# Patient Record
Sex: Male | Born: 1970 | Race: Black or African American | Hispanic: No | Marital: Single | Smoking: Former smoker
Health system: Southern US, Community
[De-identification: ages and names within clinical notes are randomized; demographics above are authoritative.]

## PROBLEM LIST (undated history)

## (undated) DIAGNOSIS — M109 Gout, unspecified: Secondary | ICD-10-CM

## (undated) DIAGNOSIS — E119 Type 2 diabetes mellitus without complications: Secondary | ICD-10-CM

## (undated) DIAGNOSIS — I10 Essential (primary) hypertension: Secondary | ICD-10-CM

## (undated) HISTORY — DX: Essential (primary) hypertension: I10

## (undated) HISTORY — PX: PENECTOMY: SHX741

## (undated) HISTORY — DX: Type 2 diabetes mellitus without complications: E11.9

---

## 2002-04-04 ENCOUNTER — Emergency Department (HOSPITAL_COMMUNITY): Admission: EM | Admit: 2002-04-04 | Discharge: 2002-04-04 | Payer: Self-pay | Admitting: Emergency Medicine

## 2014-11-14 ENCOUNTER — Emergency Department (HOSPITAL_COMMUNITY)
Admission: EM | Admit: 2014-11-14 | Discharge: 2014-11-14 | Disposition: A | Discharge status: Home / Self Care | Payer: BLUE CROSS/BLUE SHIELD | Attending: Emergency Medicine | Admitting: Emergency Medicine

## 2014-11-14 ENCOUNTER — Emergency Department (HOSPITAL_COMMUNITY): Payer: BLUE CROSS/BLUE SHIELD

## 2014-11-14 ENCOUNTER — Encounter (HOSPITAL_COMMUNITY): Payer: Self-pay | Admitting: *Deleted

## 2014-11-14 ENCOUNTER — Ambulatory Visit (INDEPENDENT_AMBULATORY_CARE_PROVIDER_SITE_OTHER): Payer: BLUE CROSS/BLUE SHIELD | Admitting: Family Medicine

## 2014-11-14 VITALS — BP 200/100 | HR 86 | Temp 98.1°F | Resp 18 | Ht 66.0 in | Wt 218.0 lb

## 2014-11-14 DIAGNOSIS — I10 Essential (primary) hypertension: Secondary | ICD-10-CM | POA: Insufficient documentation

## 2014-11-14 DIAGNOSIS — Z9119 Patient's noncompliance with other medical treatment and regimen: Secondary | ICD-10-CM | POA: Diagnosis not present

## 2014-11-14 DIAGNOSIS — E119 Type 2 diabetes mellitus without complications: Secondary | ICD-10-CM | POA: Diagnosis not present

## 2014-11-14 DIAGNOSIS — K088 Other specified disorders of teeth and supporting structures: Secondary | ICD-10-CM | POA: Diagnosis present

## 2014-11-14 DIAGNOSIS — R9431 Abnormal electrocardiogram [ECG] [EKG]: Secondary | ICD-10-CM | POA: Diagnosis not present

## 2014-11-14 DIAGNOSIS — K029 Dental caries, unspecified: Secondary | ICD-10-CM | POA: Insufficient documentation

## 2014-11-14 DIAGNOSIS — K047 Periapical abscess without sinus: Secondary | ICD-10-CM | POA: Insufficient documentation

## 2014-11-14 DIAGNOSIS — R590 Localized enlarged lymph nodes: Secondary | ICD-10-CM | POA: Insufficient documentation

## 2014-11-14 DIAGNOSIS — IMO0001 Reserved for inherently not codable concepts without codable children: Secondary | ICD-10-CM

## 2014-11-14 DIAGNOSIS — Z720 Tobacco use: Secondary | ICD-10-CM | POA: Diagnosis not present

## 2014-11-14 DIAGNOSIS — Z91199 Patient's noncompliance with other medical treatment and regimen due to unspecified reason: Secondary | ICD-10-CM

## 2014-11-14 DIAGNOSIS — R030 Elevated blood-pressure reading, without diagnosis of hypertension: Secondary | ICD-10-CM | POA: Diagnosis not present

## 2014-11-14 LAB — BASIC METABOLIC PANEL
Anion gap: 10 (ref 5–15)
BUN: 13 mg/dL (ref 6–20)
CHLORIDE: 102 mmol/L (ref 101–111)
CO2: 26 mmol/L (ref 22–32)
Calcium: 9.6 mg/dL (ref 8.9–10.3)
Creatinine, Ser: 1.14 mg/dL (ref 0.61–1.24)
GFR calc Af Amer: >60 mL/min (ref >60)
GLUCOSE: 119 mg/dL — AB (ref 70–99)
POTASSIUM: 4.1 mmol/L (ref 3.5–5.1)
Sodium: 138 mmol/L (ref 135–145)

## 2014-11-14 LAB — CBC WITH DIFFERENTIAL/PLATELET
Basophils Absolute: 0 10*3/uL (ref 0.0–0.1)
Basophils Relative: 0 % (ref 0–1)
Eosinophils Absolute: 0.1 10*3/uL (ref 0.0–0.7)
Eosinophils Relative: 1 % (ref 0–5)
HCT: 46.6 % (ref 39.0–52.0)
HEMOGLOBIN: 15.7 g/dL (ref 13.0–17.0)
LYMPHS ABS: 2.1 10*3/uL (ref 0.7–4.0)
Lymphocytes Relative: 19 % (ref 12–46)
MCH: 28.4 pg (ref 26.0–34.0)
MCHC: 33.7 g/dL (ref 30.0–36.0)
MCV: 84.4 fL (ref 78.0–100.0)
MONO ABS: 0.8 10*3/uL (ref 0.1–1.0)
Monocytes Relative: 7 % (ref 3–12)
NEUTROS PCT: 73 % (ref 43–77)
Neutro Abs: 7.9 10*3/uL — ABNORMAL HIGH (ref 1.7–7.7)
Platelets: 231 10*3/uL (ref 150–400)
RBC: 5.52 MIL/uL (ref 4.22–5.81)
RDW: 14.6 % (ref 11.5–15.5)
WBC: 11 10*3/uL — ABNORMAL HIGH (ref 4.0–10.5)

## 2014-11-14 LAB — TROPONIN I: TROPONIN I: 0.09 ng/mL — AB (ref <0.031)

## 2014-11-14 MED ORDER — HYDROCHLOROTHIAZIDE 25 MG PO TABS: 25.0000 mg | ORAL_TABLET | Freq: Every day | ORAL | Status: DC

## 2014-11-14 MED ORDER — CLINDAMYCIN HCL 300 MG PO CAPS: 300.0000 mg | ORAL_CAPSULE | Freq: Three times a day (TID) | ORAL | Status: AC

## 2014-11-14 MED ORDER — HYDROCODONE-ACETAMINOPHEN 5-325 MG PO TABS: 2.0000 | ORAL_TABLET | ORAL | Status: DC | PRN

## 2014-11-14 MED ORDER — CLINDAMYCIN PHOSPHATE 600 MG/50ML IV SOLN
600.0000 mg | Freq: Once | INTRAVENOUS | Status: AC
  Administered 2014-11-14: 600 mg via INTRAVENOUS
  Filled 2014-11-14: qty 50

## 2014-11-14 MED ORDER — IOHEXOL 300 MG/ML  SOLN
75.0000 mL | Freq: Once | INTRAMUSCULAR | Status: AC | PRN
  Administered 2014-11-14: 75 mL via INTRAVENOUS

## 2014-11-14 MED ORDER — HYDROCODONE-ACETAMINOPHEN 5-325 MG PO TABS
1.0000 | ORAL_TABLET | Freq: Once | ORAL | Status: AC
  Administered 2014-11-14: 1 via ORAL
  Filled 2014-11-14: qty 1

## 2014-11-14 MED ORDER — IBUPROFEN 400 MG PO TABS
600.0000 mg | ORAL_TABLET | Freq: Once | ORAL | Status: AC
  Administered 2014-11-14: 600 mg via ORAL
  Filled 2014-11-14 (×2): qty 1

## 2014-11-14 NOTE — Discharge Instructions (Signed)

## 2014-11-14 NOTE — ED Notes (Signed)
Patient went to urgent care today with c/o right jaw pain and swelling onset 2 days ago states he was told his blood pressure was elevated. Patient states he was dx. With htn several years ago however hasn't taken medication.

## 2014-11-14 NOTE — Progress Notes (Signed)
EKG read and patient discussed and examined with Ms. English.  Initial presentation for r face pain and dental issue, but noted marked blood pressure elevation, with med nonadherence past year. Denies weakness, speech difficulty or chest pain currently, but  EKG with TWI and nonspecific lateral findings without prior EKG available for review.  Repeat BP still significantly elevated - c/w HTN urgency. Placed on monitor, IV placed, tx to [facility] by EMS for further eval.   1:23 PM Report given to EMS, with transfer of care. Charge nurse advised.

## 2014-11-14 NOTE — ED Provider Notes (Signed)
CSN: [id number]     Arrival date & time 11/14/14  1411 History   First MD Initiated Contact with Patient 11/14/14 1428     Chief Complaint  Patient presents with  . Dental Pain     (Consider location/radiation/quality/duration/timing/severity/associated sxs/prior Treatment) HPI  44 year old male presents from urgent care after they noted significant hypertension. His blood pressure was 180/120. He presented to their facility due to right tooth and jaw pain. He states his been mild for the last day or 2 but now significant worse today. The patient denies headache, blurry vision, chest pain, shortness of breath, or other symptoms. No trouble swallowing or drooling. The patient states he has a history of hypertension but has not taken medicine in over a year. Patient states he took ibuprofen earlier with good relief.  Past Medical History  Diagnosis Date  . Diabetes mellitus without complication   . Hypertension    History reviewed. No pertinent past surgical history. History reviewed. No pertinent family history. History  Substance Use Topics  . Smoking status: Current Every Day Smoker -- 1.00 packs/day    Types: Cigarettes  . Smokeless tobacco: Not on file  . Alcohol Use: No    Review of Systems  Constitutional: Negative for fever.  HENT: Positive for dental problem and facial swelling. Negative for sore throat, trouble swallowing and voice change.   Eyes: Negative for visual disturbance.  Respiratory: Negative for shortness of breath.   Cardiovascular: Negative for chest pain and leg swelling.  Neurological: Negative for weakness, numbness and headaches.  All other systems reviewed and are negative.     Allergies  Review of patient's allergies indicates no known allergies.  Home Medications   Prior to Admission medications   Medication Sig Start Date End Date Taking? Authorizing Provider  clindamycin (CLEOCIN) 300 MG capsule Take 1 capsule (300 mg total) by mouth 3  (three) times daily. 11/14/14 11/21/14 Yes Stephanie D English, PA   BP 232/122 mmHg  Pulse 79  Temp(Src) 98.2 F (36.8 C) (Oral)  Resp 12  SpO2 98% Physical Exam  Constitutional: He is oriented to person, place, and time. He appears well-developed and well-nourished.  HENT:  Head: Normocephalic and atraumatic.  Right Ear: External ear normal.  Left Ear: External ear normal.  Nose: Nose normal.  Mouth/Throat: Uvula is midline. No trismus in the jaw. Dental caries present. No dental abscesses.    Eyes: EOM are normal. Pupils are equal, round, and reactive to light. Right eye exhibits no discharge. Left eye exhibits no discharge.  Neck: Normal range of motion. Neck supple.    Cardiovascular: Normal rate, regular rhythm, normal heart sounds and intact distal pulses.   Pulmonary/Chest: Effort normal and breath sounds normal. No stridor.  Abdominal: Soft. There is no tenderness.  Musculoskeletal: He exhibits no edema.  Neurological: He is alert and oriented to person, place, and time.  CN 2-12 grossly intact. 5/5 strength in all 4 extremities. Grossly normal sensation.  Skin: Skin is warm and dry.  Nursing note and vitals reviewed.   ED Course  Procedures (including critical care time) Labs Review Labs Reviewed  CBC WITH DIFFERENTIAL/PLATELET - Abnormal; Notable for the following:    WBC 11.0 (*)    Neutro Abs 7.9 (*)    All other components within normal limits  BASIC METABOLIC PANEL  TROPONIN I    Imaging Review Dg Chest 1 View  11/14/2014   CLINICAL DATA:  Dental pain since yesterday, history hypertension, smoking, borderline diabetes  EXAM: CHEST  1 VIEW  COMPARISON:  None  FINDINGS: Upper normal heart size.  Normal mediastinal contours and pulmonary vascularity.  Minimal peribronchial thickening.  No infiltrate, pleural effusion or pneumothorax.  Bones unremarkable.  IMPRESSION: Minimal bronchitic changes without infiltrate.   Electronically Signed   By: Ulyses Southward M.D.    On: 11/14/2014 15:52     EKG Interpretation   Date/Time:  Monday Nov 14 2014 14:12:31 EDT Ventricular Rate:  73 PR Interval:  184 QRS Duration: 107 QT Interval:  402 QTC Calculation: 443 R Axis:   -56 Text Interpretation:  Sinus rhythm Left anterior fascicular block Probable  left ventricular hypertrophy Anterior Q waves, possibly due to LVH  Abnormal T, consider ischemia, lateral leads No old tracing to compare  Confirmed by Jacoba Cherney  MD, Allye Hoyos ([id number]) on 11/14/2014 2:27:35 PM      MDM   Final diagnoses:  Essential hypertension    Patient is well-appearing here, no chest or neuro complaints. Significant hypertensive, has likely been this way for quite some time. Patient has an EKG that does show T-wave inversions but I believe these are mostly due to LVH pattern. However given his hypertension, low work was evaluated prior to starting on antihypertensives given he has likely had significant for blood pressure control for quite some time. As for his tooth, he does not have a drainable dental abscess at this time but does have some mild neck swelling, will get CT scan to rule out deep space infection. Care transferred to Dr. Anitra Lauth with CT and labs pending.    Pricilla Loveless, MD 11/14/14 [phone]

## 2014-11-14 NOTE — ED Provider Notes (Signed)
CBC and BMP are within normal limits. Troponin is mildly elevated however speaking with the patient he has no chest pain, no shortness of breath no dizziness no headaches or any others concerning symptoms for ACS or malignant hypertension. Patient's blood pressure here has been read between 230/120-162/87. His CT shows evidence of periosteal abscess but no evidence of Ludwig angina. He has no pharyngeal masses present.\  Final result by Rad Results In Interface (11/14/14 18:54:28)   Narrative:   CLINICAL DATA: Right jaw pain and swelling for 2 days.  EXAM: CT NECK WITH CONTRAST  TECHNIQUE: Multidetector CT imaging of the neck was performed using the standard protocol following the bolus administration of intravenous contrast.  CONTRAST: 45mL OMNIPAQUE IOHEXOL 300 MG/ML SOLN  COMPARISON: None.  FINDINGS: There is inflammation in the right face from odontogenic infection of the thirtieth tooth which shows periapical abscess which has breeched the buccal cortex and causes a 7 mm subperiosteal abscess. There are also large cavities with apical erosion involving the bilateral maxillary molars and left upper first premolar,  Pharynx and larynx: Asymmetric right glossotonsillar sulcus with right-sided fullness and enhancement.  Salivary glands: Negative  Thyroid: Negative  Lymph nodes: Mild reactive appearing enlargement in the right submandibular station. No suppurative changes.  Vascular: Mild bilateral cervical carotid atherosclerosis.  Limited intracranial: Negative  Visualized orbits: Negative  Mastoids and visualized paranasal sinuses: Lobulated mucosal thickening in the inferior maxillary antra, possibly odontogenic given neighboring periodontal disease.  Upper chest: No pneumonia  IMPRESSION: 1. Odontogenic infection in the right face from tooth #30 with 7 mm subperiosteal abscess. 2. Asymmetric fullness of the right glossotonsillar sulcus favors tonsillar  hypertrophy, but recommend mucosal exam to exclude a mass.    Patient sent home with pain control, blood pressure medication and follow-up.   Gwyneth Sprout, MD 11/14/14 [phone]

## 2014-11-14 NOTE — Progress Notes (Signed)
[facility] [address] [place] [address] [phone]  Date:  11/14/2014   Name:  Troy Singh   DOB:  05-24-1971   MRN:  [id number]  PCP:  No PCP Per Patient    Chief Complaint: Mouth Lesions   History of Present Illness:  Troy Singh is a 44 y.o. very pleasant male smoker with PMH of diabetes mellitus 2 and hypertension who presents with the following:  Patient is here reporting right sided mouth pain that is throbbing in character, which started yesterday.  He was sensitive with toothbrushing yesterday, but over 24 hours, it has progressed to increased facial pain and swelling at his right lower teeth and cheek.  He denies any trauma to the mouth.  He has no fever, ear pain, or dyspnea.  He has done nothing for the pain. Blood pressure is elevated today at 180/120, which he states, "stays elevated".  He was on an anti-hypertensive, but has been non-compliant for about a year.  He states that the medications did not help his BP, though he can not recall the name of his anti-hypertensive.  He has no chest pain, sob, diaphoresis, palpitations, headache, numbness or tingling, or vision changes.  [facility], but he has not been followed for about a year.  He has no known hx of MI, cardiac or cerebrovascular events.     There are no active problems to display for this patient.   Past Medical History  Diagnosis Date  . Diabetes mellitus without complication   . Hypertension     History reviewed. No pertinent past surgical history.  History  Substance Use Topics  . Smoking status: Current Every Day Smoker -- 1.00 packs/day    Types: Cigarettes  . Smokeless tobacco: Not on file  . Alcohol Use: No    History reviewed. No pertinent family history.  No Known Allergies  Medication list has been reviewed and updated.  No current outpatient prescriptions on file prior to visit.   No current facility-administered  medications on file prior to visit.    Review of Systems: ROS otherwise unremarkable unless listed above.  Physical Examination: Filed Vitals:   11/14/14 1111  BP: 180/120  Pulse: 86  Temp: 98.1 F (36.7 C)  Resp: 18   Filed Vitals:   11/14/14 1111  Height:  (1.676 m)  Weight: 218 lb (98.884 kg)   Body mass index is 35.2 kg/(m^2). Ideal Body Weight: Weight in (lb) to have BMI = 25: 154.6  Physical Exam  Constitutional: He is oriented to person, place, and time. He appears well-developed and well-nourished. No distress.  HENT:  Head: Normocephalic and atraumatic.  Mouth/Throat: Dental abscesses present. No uvula swelling. No oropharyngeal exudate, posterior oropharyngeal edema or posterior oropharyngeal erythema.  Generous swelling and mild erythema at gumline of the lower right sided third molar. Tender along this.    Eyes: EOM are normal. Pupils are equal, round, and reactive to light. Right eye exhibits no discharge. Left eye exhibits no discharge.  Cardiovascular: Normal rate, regular rhythm and normal heart sounds.  Exam reveals no gallop, no distant heart sounds and no friction rub.   No murmur heard. Pulses:      Radial pulses are 2+ on the right side, and 2+ on the left side.  Pulmonary/Chest: Effort normal and breath sounds normal. No respiratory distress. He has no wheezes.  Musculoskeletal: He exhibits no edema.  Lymphadenopathy:  Head (right side): No submandibular, no tonsillar, no preauricular and no posterior auricular adenopathy present.       Head (left side): No submandibular, no tonsillar, no preauricular and no posterior auricular adenopathy present.  Neurological: He is alert and oriented to person, place, and time.  Skin: Skin is warm and dry. He is not diaphoretic.  Psychiatric: He has a normal mood and affect. His speech is normal and behavior is normal.   EKG reviewed by Dr. Neva Seat: TWI and nonspecific lateral findings without prior EKG  available for review  Assessment and Plan: 44 year old male is here today for chief complaint of tooth pain and elevated blood pressure.  With ischemic changes on EKG, uncontrolled hypertension, and possible hypertensive urgency, this warrants immediate cardiac work up.     -EMS sent  -Ordered clindamycin, though this may change with ED plan  Tooth abscess -  Plan: clindamycin (CLEOCIN) 300 MG capsule  Elevated blood pressure -   Nonspecific abnormal electrocardiogram (ECG) (EKG)  History of nonadherence to medical treatment  Essential hypertension  Trena Platt, PA-C [facility] 5/2/20161:56 PM

## 2014-11-14 NOTE — Patient Instructions (Signed)
Please try the smile [facility] at address: [address], [place], [place] [address], [phone].  You should see a dentist within the next 7 days.

## 2014-11-14 NOTE — ED Notes (Signed)
Pt transported to CT ?

## 2017-03-20 ENCOUNTER — Encounter (HOSPITAL_COMMUNITY): Payer: Self-pay | Admitting: Emergency Medicine

## 2017-03-20 DIAGNOSIS — M109 Gout, unspecified: Secondary | ICD-10-CM | POA: Insufficient documentation

## 2017-03-20 DIAGNOSIS — E119 Type 2 diabetes mellitus without complications: Secondary | ICD-10-CM | POA: Insufficient documentation

## 2017-03-20 DIAGNOSIS — I10 Essential (primary) hypertension: Secondary | ICD-10-CM | POA: Insufficient documentation

## 2017-03-20 DIAGNOSIS — F17210 Nicotine dependence, cigarettes, uncomplicated: Secondary | ICD-10-CM | POA: Insufficient documentation

## 2017-03-20 DIAGNOSIS — M25461 Effusion, right knee: Secondary | ICD-10-CM | POA: Insufficient documentation

## 2017-03-20 DIAGNOSIS — Z79899 Other long term (current) drug therapy: Secondary | ICD-10-CM | POA: Insufficient documentation

## 2017-03-20 NOTE — ED Triage Notes (Signed)
Pt states his right knee started to feel sore on Monday and then on Tuesday it was painful and swollen  Pt states he cannot bend it or move it without it becoming intense  Pt denies injury

## 2017-03-21 ENCOUNTER — Emergency Department (HOSPITAL_COMMUNITY)
Admission: EM | Admit: 2017-03-21 | Discharge: 2017-03-21 | Disposition: A | Discharge status: Home / Self Care | Payer: Self-pay | Attending: Emergency Medicine | Admitting: Emergency Medicine

## 2017-03-21 ENCOUNTER — Emergency Department (HOSPITAL_COMMUNITY): Payer: Self-pay

## 2017-03-21 DIAGNOSIS — M109 Gout, unspecified: Secondary | ICD-10-CM

## 2017-03-21 LAB — SYNOVIAL CELL COUNT + DIFF, W/ CRYSTALS
Lymphocytes-Synovial Fld: 1 % (ref 0–20)
Monocyte-Macrophage-Synovial Fluid: 9 % — ABNORMAL LOW (ref 50–90)
Neutrophil, Synovial: 90 % — ABNORMAL HIGH (ref 0–25)
WBC, Synovial: 34650 /mm3 — ABNORMAL HIGH (ref 0–200)

## 2017-03-21 MED ORDER — OXYCODONE-ACETAMINOPHEN 5-325 MG PO TABS: 1.0000 | ORAL_TABLET | ORAL | 0 refills | Status: DC | PRN

## 2017-03-21 MED ORDER — OXYCODONE-ACETAMINOPHEN 5-325 MG PO TABS
1.0000 | ORAL_TABLET | Freq: Once | ORAL | Status: AC
  Administered 2017-03-21: 1 via ORAL
  Filled 2017-03-21: qty 1

## 2017-03-21 MED ORDER — INDOMETHACIN 25 MG PO CAPS: 25.0000 mg | ORAL_CAPSULE | Freq: Three times a day (TID) | ORAL | 0 refills | Status: DC | PRN

## 2017-03-21 NOTE — ED Provider Notes (Signed)
[facility]-EMERGENCY DEPT Provider Note   CSN: [id number] Arrival date & time: 03/20/17  2325     History   Chief Complaint Chief Complaint  Patient presents with  . Knee Pain    HPI Troy Singh is a 46 y.o. male.  The history is provided by the patient and medical records.  Knee Pain      46 year old male with history of diabetes and hypertension, presenting to the ED with right knee pain. States he began feeling sore 2 days ago, but worsened yesterday. States he is having trouble walking or changing position. States when he needs to move his leg he is having to pick it up and physically move it with his arms. States he has noticed some swelling. No reported injury, trauma, or falls.  Denies any fever or chills. No history of gout but has been eating red meat recently. He has tried some Motrin at home without significant relief.  Past Medical History:  Diagnosis Date  . Diabetes mellitus without complication (HCC)   . Hypertension     There are no active problems to display for this patient.   Past Surgical History:  Procedure Laterality Date  . PENECTOMY         Home Medications    Prior to Admission medications   Medication Sig Start Date End Date Taking? Authorizing Provider  hydrochlorothiazide (HYDRODIURIL) 25 MG tablet Take 1 tablet (25 mg total) by mouth daily. 11/14/14   Gwyneth Sprout, MD  HYDROcodone-acetaminophen (NORCO/VICODIN) 5-325 MG per tablet Take 2 tablets by mouth every 4 (four) hours as needed. 11/14/14   Gwyneth Sprout, MD    Family History Family History  Problem Relation Age of Onset  . Diabetes Other   . Hypertension Other     Social History Social History  Substance Use Topics  . Smoking status: Current Every Day Smoker    Packs/day: 1.00    Types: Cigarettes  . Smokeless tobacco: Never Used  . Alcohol use No     Allergies   Patient has no known allergies.   Review of Systems Review of Systems  Musculoskeletal: Positive  for arthralgias and joint swelling.  All other systems reviewed and are negative.    Physical Exam Updated Vital Signs BP (!) 181/101   Pulse 70   Temp 99.3 F (37.4 C) (Oral)   Resp 16   Ht 5\' 3"  (1.6 m)   Wt 104.3 kg (230 lb)   SpO2 99%   BMI 40.74 kg/m   Physical Exam  Constitutional: He is oriented to person, place, and time. He appears well-developed and well-nourished.  HENT:  Head: Normocephalic and atraumatic.  Mouth/Throat: Oropharynx is clear and moist.  Eyes: Pupils are equal, round, and reactive to light. Conjunctivae and EOM are normal.  Neck: Normal range of motion.  Cardiovascular: Normal rate, regular rhythm and normal heart sounds.   Pulmonary/Chest: Effort normal and breath sounds normal.  Abdominal: Soft. Bowel sounds are normal.  Musculoskeletal: Normal range of motion.  Right knee with swelling over the suprapatellar region along the quad tendon; not truly tender in this area but some tenderness of the inferior medial joint line; effusion inferior joint space; able to lift entire leg off bed but unable to extend leg at the knee; able to push/pull against resistance; knee is warm to touch without overlying erythema or induration  Neurological: He is alert and oriented to person, place, and time.  Skin: Skin is warm and dry.  Psychiatric: He has a  normal mood and affect.  Nursing note and vitals reviewed.    ED Treatments / Results  Labs (all labs ordered are listed, but only abnormal results are displayed) Labs Reviewed  SYNOVIAL CELL COUNT + DIFF, W/ CRYSTALS - Abnormal; Notable for the following:       Result Value   Appearance-Synovial TURBID (*)    WBC, Synovial 34,650 (*)    Neutrophil, Synovial 90 (*)    Monocyte-Macrophage-Synovial Fluid 9 (*)    All other components within normal limits  BODY FLUID CULTURE  GLUCOSE, BODY FLUID OTHER  PROTEIN, BODY FLUID (OTHER)    EKG  EKG Interpretation None       Radiology No results  found.  Procedures .Joint Aspiration/Arthrocentesis Date/Time: 03/21/2017 4:46 AM Performed by: Garlon Hatchet Authorized by: Garlon Hatchet   Consent:    Consent obtained:  Verbal   Consent given by:  Patient   Risks discussed:  Bleeding, pain, infection and nerve damage   Alternatives discussed:  No treatment and delayed treatment Location:    Location:  Knee   Knee:  R knee Anesthesia (see MAR for exact dosages):    Anesthesia method:  Topical application   Topical anesthetic:  Benzocaine gel Procedure details:    Preparation: Patient was prepped and draped in usual sterile fashion     Needle gauge:  22 G   Ultrasound guidance: no     Approach:  Medial   Aspirate amount:  40 cc   Aspirate characteristics:  Yellow and cloudy   Steroid injected: no     Specimen collected: yes   Post-procedure details:    Dressing:  Adhesive bandage   Patient tolerance of procedure:  Tolerated well, no immediate complications   (including critical care time)    Medications Ordered in ED Medications  oxyCODONE-acetaminophen (PERCOCET/ROXICET) 5-325 MG per tablet 1 tablet (1 tablet Oral Given 03/21/17 0356)     Initial Impression / Assessment and Plan / ED Course  I have reviewed the triage vital signs and the nursing notes.  Pertinent labs & imaging results that were available during my care of the patient were reviewed by me and considered in my medical decision making (see chart for details).  46 year old male here with right knee pain and swelling. No reported injury or trauma. States he is now having some difficulty moving the leg. On exam patient does have significant amount of swelling over the suprapatellar region extending over the quad tendon. Patient is able to lift his leg independently off the bed, but unable to extend at the knee. Leg is neurovascularly intact. Knee is warm to touch but no overlying erythema or induration. No history of gout. No recent fever or chills. X-ray  obtained, large effusion noted with degenerative changes.  [place] of knee performed, quad tendon appear intact.  There is large effusion noted.  Arthrocentesis performed, 40 mL of cloudy/yellow fluid aspirated. Will send for studies.  Fluid sample with 34,000 WBC's, monosodium urate crystals noted consistent with gout.  WBC count still within normal inflammatory range.  No organism seen on gram stain.  Will d/c home with treatment for gout.  Discussed low purine diet, RICE routine, compression with knee sleeve.  Currently uninsured without PCP-- will refer to wellness clinic for follow-up.  Discussed plan with patient, he acknowledged understanding and agreed with plan of care.  Return precautions given for new or worsening symptoms.  Final Clinical Impressions(s) / ED Diagnoses   Final diagnoses:  Acute gout  of right knee, unspecified cause    New Prescriptions New Prescriptions   INDOMETHACIN (INDOCIN) 25 MG CAPSULE    Take 1 capsule (25 mg total) by mouth 3 (three) times daily as needed.   OXYCODONE-ACETAMINOPHEN (PERCOCET) 5-325 MG TABLET    Take 1 tablet by mouth every 4 (four) hours as needed.     Garlon Hatchet, PA-C 03/21/17 0654    Garlon Hatchet, PA-C 03/21/17 1610    Derwood Kaplan, MD 03/21/17 2122

## 2017-03-21 NOTE — Discharge Instructions (Signed)
Your knee culture today did show evidence of crystals that are consistent with gout. See the attached information about gout, try to follow low purine diet (limit red meat, fish, alcohol as these are known triggers) Take the prescribed medication as directed.  Ice and elevate knee.  Can wear sleeve for compression. Follow-up with the wellness clinic-- call for appt. Return to the ED for new or worsening symptoms.

## 2017-03-21 NOTE — ED Notes (Signed)
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 He sts pain is constant, throbbing, he can't bear much weight on the right leg, sts feels as if his knee is giving out.

## 2017-03-22 LAB — PROTEIN, BODY FLUID (OTHER): Total Protein, Body Fluid Other: 5.4 g/dL

## 2017-03-22 LAB — GLUCOSE, BODY FLUID OTHER: Glucose, Body Fluid Other: 28 mg/dL

## 2017-03-24 LAB — BODY FLUID CULTURE: Culture: NO GROWTH

## 2018-07-20 ENCOUNTER — Emergency Department (HOSPITAL_COMMUNITY)
Admission: EM | Admit: 2018-07-20 | Discharge: 2018-07-20 | Disposition: A | Discharge status: Home / Self Care | Payer: BLUE CROSS/BLUE SHIELD | Attending: Emergency Medicine | Admitting: Emergency Medicine

## 2018-07-20 ENCOUNTER — Other Ambulatory Visit: Payer: Self-pay

## 2018-07-20 ENCOUNTER — Emergency Department (HOSPITAL_COMMUNITY): Payer: BLUE CROSS/BLUE SHIELD

## 2018-07-20 ENCOUNTER — Encounter (HOSPITAL_COMMUNITY): Payer: Self-pay

## 2018-07-20 DIAGNOSIS — F17210 Nicotine dependence, cigarettes, uncomplicated: Secondary | ICD-10-CM | POA: Insufficient documentation

## 2018-07-20 DIAGNOSIS — W228XXA Striking against or struck by other objects, initial encounter: Secondary | ICD-10-CM | POA: Insufficient documentation

## 2018-07-20 DIAGNOSIS — Y999 Unspecified external cause status: Secondary | ICD-10-CM | POA: Insufficient documentation

## 2018-07-20 DIAGNOSIS — S8002XA Contusion of left knee, initial encounter: Secondary | ICD-10-CM

## 2018-07-20 DIAGNOSIS — I10 Essential (primary) hypertension: Secondary | ICD-10-CM | POA: Insufficient documentation

## 2018-07-20 DIAGNOSIS — Y929 Unspecified place or not applicable: Secondary | ICD-10-CM | POA: Diagnosis not present

## 2018-07-20 DIAGNOSIS — Y939 Activity, unspecified: Secondary | ICD-10-CM | POA: Diagnosis not present

## 2018-07-20 DIAGNOSIS — M25462 Effusion, left knee: Secondary | ICD-10-CM

## 2018-07-20 DIAGNOSIS — R030 Elevated blood-pressure reading, without diagnosis of hypertension: Secondary | ICD-10-CM

## 2018-07-20 DIAGNOSIS — E119 Type 2 diabetes mellitus without complications: Secondary | ICD-10-CM | POA: Insufficient documentation

## 2018-07-20 DIAGNOSIS — S8992XA Unspecified injury of left lower leg, initial encounter: Secondary | ICD-10-CM | POA: Diagnosis present

## 2018-07-20 MED ORDER — HYDROCODONE-ACETAMINOPHEN 5-325 MG PO TABS: 1.0000 | ORAL_TABLET | ORAL | 0 refills | Status: DC | PRN

## 2018-07-20 MED ORDER — TRAMADOL HCL 50 MG PO TABS
50.0000 mg | ORAL_TABLET | Freq: Once | ORAL | Status: AC
  Administered 2018-07-20: 50 mg via ORAL
  Filled 2018-07-20: qty 1

## 2018-07-20 MED ORDER — PREDNISONE 10 MG PO TABS: 40.0000 mg | ORAL_TABLET | Freq: Every day | ORAL | 0 refills | Status: DC

## 2018-07-20 NOTE — ED Triage Notes (Addendum)
Patient c/o left knee pain and swelling  x 2 days.

## 2018-07-20 NOTE — ED Notes (Signed)
Patient transported to X-ray 

## 2018-07-20 NOTE — ED Notes (Signed)
NP made aware of BP prior to d/c. Patient instructed to follow up for BP recheck.

## 2018-07-20 NOTE — Discharge Instructions (Addendum)
Follow up with [facility] for your blood pressure and for your knee. They can help you get an orthopedic doctor if you can not get in with the one we refer you to. Do not drive while taking the narcotic as it will make you sleepy.

## 2018-07-20 NOTE — ED Provider Notes (Signed)
[facility]-EMERGENCY DEPT Provider Note   CSN: [id number] Arrival date & time: 07/20/18  1848     History   Chief Complaint Chief Complaint  Patient presents with  . Knee Pain  . Joint Swelling    HPI Troy Singh is a 48 y.o. male with hx of DM and HTN who presents to the ED with left knee pain and swelling after he hit his knee on a metal door 3 days ago. Patient has not taken anything but tylenol for pain and it did not help.   The history is provided by the patient. No language interpreter was used.  Knee Pain  Location:  Knee Time since incident:  3 days Injury: yes   Knee location:  L knee Pain details:    Quality:  Pressure and throbbing   Radiates to:  Does not radiate   Severity:  Severe   Onset quality:  Sudden   Timing:  Constant   Progression:  Worsening Chronicity:  New Foreign body present:  No foreign bodies Relieved by:  Nothing Worsened by:  Bearing weight Ineffective treatments:  Acetaminophen Associated symptoms: no fever     Past Medical History:  Diagnosis Date  . Diabetes mellitus without complication (HCC)   . Hypertension     There are no active problems to display for this patient.   Past Surgical History:  Procedure Laterality Date  . PENECTOMY          Home Medications    Prior to Admission medications   Medication Sig Start Date End Date Taking? Authorizing Provider  hydrochlorothiazide (HYDRODIURIL) 25 MG tablet Take 1 tablet (25 mg total) by mouth daily. Patient not taking: Reported on 03/21/2017 11/14/14   Gwyneth Sprout, MD  HYDROcodone-acetaminophen (NORCO/VICODIN) 5-325 MG tablet Take 1 tablet by mouth every 4 (four) hours as needed. 07/20/18   Janne Napoleon, NP  predniSONE (DELTASONE) 10 MG tablet Take 4 tablets (40 mg total) by mouth daily with breakfast. 07/20/18   Janne Napoleon, NP    Family History Family History  Problem Relation Age of Onset  . Diabetes Other   . Hypertension Other      Social History Social History   Tobacco Use  . Smoking status: Current Every Day Smoker    Packs/day: 1.00    Types: Cigarettes  . Smokeless tobacco: Never Used  Substance Use Topics  . Alcohol use: No    Alcohol/week: 0.0 standard drinks  . Drug use: No     Allergies   Patient has no known allergies.   Review of Systems Review of Systems  Constitutional: Negative for fever.  HENT: Negative.   Eyes: Negative for visual disturbance.  Respiratory: Negative for shortness of breath.   Cardiovascular: Negative for chest pain.  Gastrointestinal: Negative for abdominal pain and vomiting.  Genitourinary: Negative for dysuria, frequency and urgency.  Musculoskeletal: Positive for arthralgias and joint swelling.       Left knee pain and swelling  Skin: Negative for rash and wound.  Neurological: Negative for headaches.  Psychiatric/Behavioral: Negative for confusion.     Physical Exam Updated Vital Signs BP (!) 185/106 (BP Location: Left Arm)   Pulse 91   Temp 100 F (37.8 C) (Oral)   Resp 17   Ht 5\' 3"  (1.6 m)   Wt 104.3 kg   SpO2 97%   BMI 40.74 kg/m   Physical Exam Vitals signs and nursing note reviewed.  Constitutional:      General:  He is not in acute distress.    Appearance: He is well-developed.  HENT:     Head: Normocephalic.     Nose: Nose normal.     Mouth/Throat:     Mouth: Mucous membranes are moist.  Eyes:     Extraocular Movements: Extraocular movements intact.  Neck:     Musculoskeletal: Neck supple.  Cardiovascular:     Rate and Rhythm: Normal rate.  Pulmonary:     Effort: Pulmonary effort is normal.  Musculoskeletal:     Left knee: He exhibits decreased range of motion (due to pain), swelling and effusion. He exhibits no deformity, no laceration and no erythema. Tenderness found.     Comments: Pedal pulses 2+, pain with flexion of the knee. Effusion palpated.   Skin:    General: Skin is warm and dry.  Neurological:     Mental  Status: He is alert and oriented to person, place, and time.  Psychiatric:        Mood and Affect: Mood normal.      ED Treatments / Results  Labs (all labs ordered are listed, but only abnormal results are displayed) Labs Reviewed - No data to display  Radiology Dg Knee Complete 4 Views Left  Result Date: 07/20/2018 CLINICAL DATA:  Left knee pain after injury. EXAM: LEFT KNEE - COMPLETE 4+ VIEW COMPARISON:  None. FINDINGS: No evidence of fracture or dislocation. Moderate suprapatellar joint effusion is noted. No evidence of arthropathy or other focal bone abnormality. Soft tissues are unremarkable. IMPRESSION: Moderate suprapatellar joint effusion. No other abnormality seen in the left knee. Electronically Signed   By: Lupita Raider, M.D.   On: 07/20/2018 20:15    Procedures Procedures (including critical care time)  Medications Ordered in ED Medications  traMADol (ULTRAM) tablet 50 mg (50 mg Oral Given 07/20/18 2007)     Initial Impression / Assessment and Plan / ED Course  I have reviewed the triage vital signs and the nursing notes. 48 y.o. male here with pain and swelling of the left knee s/p injury 3 days ago stable for d/c without fracture or dislocation. Will treat for effusion with knee immobilizer, crutches, pain management and f/u with [place] and/or ortho. Discussed elevated BP with the patient and need for f/u with [facility]. Discussed that although he is in pain it is unlikely that his BP would be this high. Patient agrees with plan.  Final Clinical Impressions(s) / ED Diagnoses   Final diagnoses:  Effusion of left knee  Contusion of left knee, initial encounter  Elevated blood pressure reading    ED Discharge Orders         Ordered    HYDROcodone-acetaminophen (NORCO/VICODIN) 5-325 MG tablet  Every 4 hours PRN     07/20/18 2046    predniSONE (DELTASONE) 10 MG tablet  Daily with breakfast     07/20/18 2046           Kerrie Buffalo Novelty,  NP 07/20/18 2141    Little, Ambrose Finland, MD 07/21/18 [phone]

## 2018-07-26 DIAGNOSIS — Z79899 Other long term (current) drug therapy: Secondary | ICD-10-CM | POA: Insufficient documentation

## 2018-07-26 DIAGNOSIS — I10 Essential (primary) hypertension: Secondary | ICD-10-CM | POA: Insufficient documentation

## 2018-07-26 DIAGNOSIS — M109 Gout, unspecified: Secondary | ICD-10-CM | POA: Insufficient documentation

## 2018-07-26 DIAGNOSIS — F17210 Nicotine dependence, cigarettes, uncomplicated: Secondary | ICD-10-CM | POA: Diagnosis not present

## 2018-07-26 DIAGNOSIS — E119 Type 2 diabetes mellitus without complications: Secondary | ICD-10-CM | POA: Diagnosis not present

## 2018-07-26 DIAGNOSIS — M25562 Pain in left knee: Secondary | ICD-10-CM | POA: Diagnosis present

## 2018-07-27 ENCOUNTER — Encounter (HOSPITAL_COMMUNITY): Payer: Self-pay | Admitting: Emergency Medicine

## 2018-07-27 ENCOUNTER — Emergency Department (HOSPITAL_COMMUNITY)
Admission: EM | Admit: 2018-07-27 | Discharge: 2018-07-27 | Disposition: A | Discharge status: Home / Self Care | Payer: BLUE CROSS/BLUE SHIELD | Attending: Emergency Medicine | Admitting: Emergency Medicine

## 2018-07-27 ENCOUNTER — Other Ambulatory Visit: Payer: Self-pay

## 2018-07-27 DIAGNOSIS — M109 Gout, unspecified: Secondary | ICD-10-CM

## 2018-07-27 MED ORDER — INDOMETHACIN 25 MG PO CAPS
50.0000 mg | ORAL_CAPSULE | Freq: Once | ORAL | Status: AC
  Administered 2018-07-27: 50 mg via ORAL
  Filled 2018-07-27: qty 2

## 2018-07-27 MED ORDER — INDOMETHACIN 25 MG PO CAPS: 25.0000 mg | ORAL_CAPSULE | Freq: Three times a day (TID) | ORAL | 0 refills | Status: DC | PRN

## 2018-07-27 NOTE — ED Provider Notes (Signed)
[facility]-EMERGENCY DEPT Provider Note   CSN: [id number] Arrival date & time: 07/26/18  2344     History   Chief Complaint Chief Complaint  Patient presents with  . Gout    HPI Troy Singh is a 48 y.o. male.  Patient returns to the ED with persistent left knee pain and swelling. Seen multiple times for similar symptoms, past history of gout (diagnosed by joint aspiration), seen last on 07/20/18. He was given prednisone and reports symptoms were not improved with use of this medication. He states he does not have a primary care provider. No fever or injury. No calf of thigh pain/swelling.   The history is provided by the patient. No language interpreter was used.    Past Medical History:  Diagnosis Date  . Diabetes mellitus without complication (HCC)   . Hypertension     There are no active problems to display for this patient.   Past Surgical History:  Procedure Laterality Date  . PENECTOMY          Home Medications    Prior to Admission medications   Medication Sig Start Date End Date Taking? Authorizing Provider  hydrochlorothiazide (HYDRODIURIL) 25 MG tablet Take 1 tablet (25 mg total) by mouth daily. Patient not taking: Reported on 03/21/2017 11/14/14   Gwyneth Sprout, MD  HYDROcodone-acetaminophen (NORCO/VICODIN) 5-325 MG tablet Take 1 tablet by mouth every 4 (four) hours as needed. 07/20/18   Janne Napoleon, NP  predniSONE (DELTASONE) 10 MG tablet Take 4 tablets (40 mg total) by mouth daily with breakfast. 07/20/18   Janne Napoleon, NP    Family History Family History  Problem Relation Age of Onset  . Diabetes Other   . Hypertension Other     Social History Social History   Tobacco Use  . Smoking status: Current Every Day Smoker    Packs/day: 1.00    Types: Cigarettes  . Smokeless tobacco: Never Used  Substance Use Topics  . Alcohol use: No    Alcohol/week: 0.0 standard drinks  . Drug use: No     Allergies   Patient has no  known allergies.   Review of Systems Review of Systems  Constitutional: Negative for chills and fever.  Respiratory: Negative.  Negative for shortness of breath.   Cardiovascular: Negative.  Negative for chest pain.  Musculoskeletal:       See HPI  Skin: Negative.  Negative for color change.  Neurological: Negative.      Physical Exam Updated Vital Signs BP (!) 190/98 (BP Location: Left Arm) Comment: Ilene, RN aware  Pulse 77   Temp 99.2 F (37.3 C) (Oral)   Resp 16   Ht 5\' 3"  (1.6 m)   Wt 104.3 kg   SpO2 96%   BMI 40.74 kg/m   Physical Exam Constitutional:      Appearance: He is well-developed.  Neck:     Musculoskeletal: Normal range of motion.  Cardiovascular:     Pulses: Normal pulses.  Pulmonary:     Effort: Pulmonary effort is normal.  Musculoskeletal: Normal range of motion.     Comments: Left knee is moderately swollen. No redness or warmth. Left thigh and calf are nontender. FROM.   Skin:    General: Skin is warm and dry.  Neurological:     Mental Status: He is alert and oriented to person, place, and time.      ED Treatments / Results  Labs (all labs ordered are listed, but only abnormal  results are displayed) Labs Reviewed - No data to display  EKG None  Radiology No results found.  Procedures Procedures (including critical care time)  Medications Ordered in ED Medications - No data to display   Initial Impression / Assessment and Plan / ED Course  I have reviewed the triage vital signs and the nursing notes.  Pertinent labs & imaging results that were available during my care of the patient were reviewed by me and considered in my medical decision making (see chart for details).     Patient to ED with left knee pain and swelling, no improvement since previous treatment on 07/20/18. No new symptoms. History of gout.  Chart reviewed. He has had joint aspiration that showed crystals in the aspirate for definitive diagnosis of gout in  the past. No fever. No evidence on exam of septic joint. He reports Indomethacin has worked well in the past.   Will start on Indocin and strongly suggest establishing with primary care for routine medical needs, including recheck of elevated blood pressure.   Final Clinical Impressions(s) / ED Diagnoses   Final diagnoses:  None   1. Left knee gout  ED Discharge Orders    None       Elpidio Anis, PA-C 07/27/18 0411    Zadie Rhine, MD 07/27/18 [phone]

## 2018-07-27 NOTE — ED Triage Notes (Signed)
Pt reports left knee pain and treated for gout and was given prednisone to reduce swelling but pt reports no improvement. Pt also reported elevated blood pressure and is not taking medications.

## 2019-04-29 ENCOUNTER — Emergency Department (HOSPITAL_COMMUNITY)
Admission: EM | Admit: 2019-04-29 | Discharge: 2019-04-29 | Disposition: A | Discharge status: Home / Self Care | Payer: BC Managed Care – PPO | Attending: Emergency Medicine | Admitting: Emergency Medicine

## 2019-04-29 ENCOUNTER — Encounter (HOSPITAL_COMMUNITY): Payer: Self-pay

## 2019-04-29 ENCOUNTER — Other Ambulatory Visit: Payer: Self-pay

## 2019-04-29 DIAGNOSIS — F17210 Nicotine dependence, cigarettes, uncomplicated: Secondary | ICD-10-CM | POA: Diagnosis not present

## 2019-04-29 DIAGNOSIS — M25561 Pain in right knee: Secondary | ICD-10-CM | POA: Diagnosis present

## 2019-04-29 DIAGNOSIS — E119 Type 2 diabetes mellitus without complications: Secondary | ICD-10-CM | POA: Diagnosis not present

## 2019-04-29 DIAGNOSIS — Z79899 Other long term (current) drug therapy: Secondary | ICD-10-CM | POA: Diagnosis not present

## 2019-04-29 DIAGNOSIS — I10 Essential (primary) hypertension: Secondary | ICD-10-CM | POA: Insufficient documentation

## 2019-04-29 DIAGNOSIS — I159 Secondary hypertension, unspecified: Secondary | ICD-10-CM

## 2019-04-29 HISTORY — DX: Gout, unspecified: M10.9

## 2019-04-29 LAB — I-STAT CHEM 8, ED
BUN: 24 mg/dL — ABNORMAL HIGH (ref 6–20)
Calcium, Ion: 1.09 mmol/L — ABNORMAL LOW (ref 1.15–1.40)
Chloride: 105 mmol/L (ref 98–111)
Creatinine, Ser: 1.3 mg/dL — ABNORMAL HIGH (ref 0.61–1.24)
Glucose, Bld: 117 mg/dL — ABNORMAL HIGH (ref 70–99)
HCT: 43 % (ref 39.0–52.0)
Hemoglobin: 14.6 g/dL (ref 13.0–17.0)
Potassium: 4.1 mmol/L (ref 3.5–5.1)
Sodium: 140 mmol/L (ref 135–145)
TCO2: 25 mmol/L (ref 22–32)

## 2019-04-29 MED ORDER — HYDROCODONE-ACETAMINOPHEN 5-325 MG PO TABS: 1.0000 | ORAL_TABLET | ORAL | 0 refills | Status: DC | PRN

## 2019-04-29 MED ORDER — HYDROCODONE-ACETAMINOPHEN 5-325 MG PO TABS: 2.0000 | ORAL_TABLET | ORAL | 0 refills | Status: DC | PRN

## 2019-04-29 MED ORDER — AMLODIPINE BESYLATE 2.5 MG PO TABS: 2.5000 mg | ORAL_TABLET | Freq: Every day | ORAL | 0 refills | Status: DC

## 2019-04-29 MED ORDER — PREDNISONE 20 MG PO TABS: 40.0000 mg | ORAL_TABLET | Freq: Every day | ORAL | 0 refills | Status: DC

## 2019-04-29 NOTE — ED Notes (Signed)
ED Provider at bedside. 

## 2019-04-29 NOTE — ED Triage Notes (Addendum)
Patient reports that he began having right knee pain x 3 days. Patient reports a history of gout. Patient states he is needing a medication refill for Indocin.  Patient was hypertensive in triage (197/119). Patient states he takes no medication

## 2019-04-29 NOTE — Discharge Instructions (Signed)
Your kidney function is mildly elevated.  I would avoid significant amount of NSAIDs such as Aleve, indomethacin or ibuprofen.  Have given you a short course of strong pain medication to take this medication can make you drowsy is to be careful with taking it.  I would also recommend ice and Ace wrap to the knee.  I have also given you steroids start taking as well to see if this helps with the swelling and pain.  Your blood pressure is significantly elevated.  This is very important for you to follow with a primary care doctor as you need your blood pressure rechecked.  Have given you low dose of blood pressure medication.  This medication needs to be adjusted so you need to see a provider before stopping this medication.  Have given you a primary care doctor to call above or you can use the number on the back of your discharge paperwork to find a primary care doctor in the area.  I have offered you an aspiration of the knee joint to make sure this is not infectious.  However understand you want to avoid.  If you develop any fevers, redness or worsening swelling and pain to the area return to the ER.

## 2019-04-29 NOTE — ED Provider Notes (Signed)
[facility] DEPT Provider Note   CSN: [id number] Arrival date & time: 04/29/19  1257     History   Chief Complaint Chief Complaint  Patient presents with  . Medication Refill  . Knee Pain  . Hypertension    HPI Troy Singh is a 48 y.o. male.     HPI 48 year old male past medical history significant for diabetes, hypertension not on medications presents to the ER for evaluation of right knee pain.  States that it began approximately 3 days ago.  He reports that it began to swell.  He did take his indomethacin that he had from his prior gout attack that improved the swelling.  He reports persistent pain.  He states that he is able to walk but this does cause some pain.  Patient denies any reported injuries traumas or falls.  Denies any fevers or chills.  He was seen in the ER 2 years ago at this time and had a joint aspiration insistent with gout.  Patient states that he did have a flare last year as well and typically has a flare once a year.  Patient besides indomethacin is taken no other medications.  He also has been noted to be hypertensive throughout all of his ER visits.  Patient was given HCTZ 3 years ago when he was in the ER however he never followed up with a primary care doctor and has been no other medications for high blood pressure.  Patient denies any headache, vision changes, lightheadedness.  Denies any chest pain or shortness of breath.  He does not have a primary care doctor. Past Medical History:  Diagnosis Date  . Diabetes mellitus without complication ([place])   . Gout   . Hypertension     There are no active problems to display for this patient.   Past Surgical History:  Procedure Laterality Date  . PENECTOMY          Home Medications    Prior to Admission medications   Medication Sig Start Date End Date Taking? Authorizing Provider  amLODipine (NORVASC) 2.5 MG tablet Take 1 tablet (2.5 mg total) by mouth daily.  04/29/19   Doristine Devoid, PA-C  hydrochlorothiazide (HYDRODIURIL) 25 MG tablet Take 1 tablet (25 mg total) by mouth daily. Patient not taking: Reported on 03/21/2017 11/14/14   Blanchie Dessert, MD  HYDROcodone-acetaminophen (NORCO/VICODIN) 5-325 MG tablet Take 1 tablet by mouth every 4 (four) hours as needed for severe pain. 04/29/19   Doristine Devoid, PA-C  indomethacin (INDOCIN) 25 MG capsule Take 1 capsule (25 mg total) by mouth 3 (three) times daily as needed. 07/27/18   Charlann Lange, PA-C  predniSONE (DELTASONE) 20 MG tablet Take 2 tablets (40 mg total) by mouth daily. 04/29/19   Doristine Devoid, PA-C    Family History Family History  Problem Relation Age of Onset  . Diabetes Other   . Hypertension Other     Social History Social History   Tobacco Use  . Smoking status: Current Every Day Smoker    Packs/day: 1.00    Types: Cigarettes  . Smokeless tobacco: Never Used  Substance Use Topics  . Alcohol use: No    Alcohol/week: 0.0 standard drinks  . Drug use: No     Allergies   Patient has no allergy information on record.   Review of Systems Review of Systems  Constitutional: Negative for chills and fever.  HENT: Negative for congestion.   Eyes: Negative for discharge.  Respiratory:  Negative for cough.   Gastrointestinal: Negative for nausea and vomiting.  Musculoskeletal: Positive for arthralgias, joint swelling and myalgias.  Skin: Negative for color change.  Neurological: Negative for weakness and numbness.  Psychiatric/Behavioral: Negative for confusion.     Physical Exam Updated Vital Signs BP (!) 185/110 (BP Location: Right Arm)   Pulse 75   Temp 99.1 F (37.3 C) (Oral)   Resp 18   Ht 5\' 3"  (1.6 m)   Wt 104.3 kg   SpO2 100%   BMI 40.74 kg/m   Physical Exam Vitals signs and nursing note reviewed.  Constitutional:      General: He is not in acute distress.    Appearance: He is well-developed.  HENT:     Head: Normocephalic and  atraumatic.  Eyes:     General: No scleral icterus.       Right eye: No discharge.        Left eye: No discharge.  Neck:     Musculoskeletal: Normal range of motion.  Cardiovascular:     Pulses: Normal pulses.  Pulmonary:     Effort: No respiratory distress.  Musculoskeletal: Normal range of motion.     Comments: Right knee with swelling over the suprapatellar region along the quad tendon;  effusion inferior joint space; able to lift entire leg off bed.  Does have full range of motion.  Able to push/pull against resistance; knee is not warm to touch and no overlying erythema or induration   Skin:    General: Skin is warm and dry.     Capillary Refill: Capillary refill takes less than 2 seconds.     Coloration: Skin is not pale.  Neurological:     Mental Status: He is alert.  Psychiatric:        Behavior: Behavior normal.        Thought Content: Thought content normal.        Judgment: Judgment normal.      ED Treatments / Results  Labs (all labs ordered are listed, but only abnormal results are displayed) Labs Reviewed  I-STAT CHEM 8, ED - Abnormal; Notable for the following components:      Result Value   BUN 24 (*)    Creatinine, Ser 1.30 (*)    Glucose, Bld 117 (*)    Calcium, Ion 1.09 (*)    All other components within normal limits    EKG None  Radiology No results found.  Procedures Procedures (including critical care time)  Medications Ordered in ED Medications - No data to display   Initial Impression / Assessment and Plan / ED Course  I have reviewed the triage vital signs and the nursing notes.  Pertinent labs & imaging results that were available during my care of the patient were reviewed by me and considered in my medical decision making (see chart for details).        48 year old male history of gout, hypertension and diabetes presents the ER for evaluation of right knee pain and swelling.  Patient reports it started 3 days ago.  Consistent  with his prior gout flares.  Patient did have 3 or 4 pills of indomethacin that he took 2 days ago that did help with the pain and swelling however he ran out and the pain has persisted.  Patient denies any systemic signs of infection.  On exam he does have range of motion.  He does have a joint effusion but no overlying erythema or warmth noted.  Patient  neurovascularly intact.  Patient is severely hypertensive in the ER but denies any complaints associated this including headache, vision changes, chest pain or shortness of breath.  I have low suspicion for septic arthritis given patient's prior history of gout however I did offer joint aspiration to rule this out and patient declined.  He states that this feels like his gout and does not want the procedure performed in the ER today.  He is requesting a prescription refill for his indomethacin.  However given his significantly elevated blood pressure BMP was ordered to rule out kidney dysfunction.  Mild elevation in creatinine of 1.3.  Baseline appears to be 1.1.  Will avoid indomethacin or significant amount of NSAIDs given this.  Patient will be given short course of steroids and pain medication.  Patient is not a primary care doctor and does not establish care with his blood pressure.  I am concerned given that his blood pressure is severely elevated in the ER today and every visit has been in the ER has been elevated.  Patient was on HCTZ at one-point prescribed by ER attending.  He states that this was 3 years ago.  Will start patient on low-dose amlodipine 2.5 mg.  Want to avoid HCTZ given history of gout.  Also given patient's history of increased kidney function would avoid ACEs at this time.  Patient has been given primary care follow-up and I discussed that this is very important.  The patient develops any worsening signs of infection he should return to the ER as he may need a joint aspiration performed.  Pt is hemodynamically stable, in NAD, & able to  ambulate in the ED. Evaluation does not show pathology that would require ongoing emergent intervention or inpatient treatment. I explained the diagnosis to the patient. Pain has been managed & has no complaints prior to dc. Pt is comfortable with above plan and is stable for discharge at this time. All questions were answered prior to disposition. Strict return precautions for f/u to the ED were discussed. Encouraged follow up with PCP.    Final Clinical Impressions(s) / ED Diagnoses   Final diagnoses:  Secondary hypertension  Acute pain of right knee    ED Discharge Orders         Ordered    predniSONE (DELTASONE) 20 MG tablet  Daily     04/29/19 1428    HYDROcodone-acetaminophen (NORCO/VICODIN) 5-325 MG tablet  Every 4 hours PRN,   Status:  Discontinued     04/29/19 1428    amLODipine (NORVASC) 2.5 MG tablet  Daily     04/29/19 1428    HYDROcodone-acetaminophen (NORCO/VICODIN) 5-325 MG tablet  Every 4 hours PRN     04/29/19 1613           Rise Mu, PA-C 04/29/19 1855    Lorre Nick, MD 05/03/19 1225

## 2019-06-14 ENCOUNTER — Encounter (HOSPITAL_COMMUNITY): Payer: Self-pay | Admitting: Emergency Medicine

## 2019-06-14 ENCOUNTER — Emergency Department (HOSPITAL_COMMUNITY)
Admission: EM | Admit: 2019-06-14 | Discharge: 2019-06-15 | Disposition: A | Discharge status: Home / Self Care | Payer: BC Managed Care – PPO | Attending: Emergency Medicine | Admitting: Emergency Medicine

## 2019-06-14 ENCOUNTER — Other Ambulatory Visit: Payer: Self-pay

## 2019-06-14 DIAGNOSIS — I10 Essential (primary) hypertension: Secondary | ICD-10-CM | POA: Diagnosis not present

## 2019-06-14 DIAGNOSIS — R2242 Localized swelling, mass and lump, left lower limb: Secondary | ICD-10-CM | POA: Diagnosis present

## 2019-06-14 DIAGNOSIS — Z79899 Other long term (current) drug therapy: Secondary | ICD-10-CM | POA: Diagnosis not present

## 2019-06-14 DIAGNOSIS — F17210 Nicotine dependence, cigarettes, uncomplicated: Secondary | ICD-10-CM | POA: Insufficient documentation

## 2019-06-14 DIAGNOSIS — M109 Gout, unspecified: Secondary | ICD-10-CM | POA: Diagnosis not present

## 2019-06-14 DIAGNOSIS — E119 Type 2 diabetes mellitus without complications: Secondary | ICD-10-CM | POA: Insufficient documentation

## 2019-06-14 LAB — I-STAT CHEM 8, ED
BUN: 21 mg/dL — ABNORMAL HIGH (ref 6–20)
Calcium, Ion: 1.22 mmol/L (ref 1.15–1.40)
Chloride: 106 mmol/L (ref 98–111)
Creatinine, Ser: 1.3 mg/dL — ABNORMAL HIGH (ref 0.61–1.24)
Glucose, Bld: 105 mg/dL — ABNORMAL HIGH (ref 70–99)
HCT: 44 % (ref 39.0–52.0)
Hemoglobin: 15 g/dL (ref 13.0–17.0)
Potassium: 4 mmol/L (ref 3.5–5.1)
Sodium: 141 mmol/L (ref 135–145)
TCO2: 23 mmol/L (ref 22–32)

## 2019-06-14 MED ORDER — PREDNISONE 10 MG (21) PO TBPK: ORAL_TABLET | Freq: Every day | ORAL | 0 refills | Status: AC

## 2019-06-14 NOTE — ED Provider Notes (Signed)
[facility]-EMERGENCY DEPT Provider Note   CSN: [id number] Arrival date & time: 06/14/19  1600     History   Chief Complaint Chief Complaint  Patient presents with   Foot Pain    HPI Troy Singh is a 48 y.o. male with PMH significant for HTN, DM, and gout who presents to the ED with acute onset left foot swelling, erythema, and pain.  Patient has swelling and tenderness most notably over medial malleolus and first MTP joint.  He reports that he has had multiple episodes of gout in the past year alone that were previously verified with arthrocentesis.  He evidently was eating seafood this past weekend which he believes precipitated his flare.  Patient denies any recent illness, fevers or chills, IVDA, penile discharge, dysuria, or other symptoms.     HPI  Past Medical History:  Diagnosis Date   Diabetes mellitus without complication (HCC)    Gout    Hypertension     There are no active problems to display for this patient.   Past Surgical History:  Procedure Laterality Date   PENECTOMY          Home Medications    Prior to Admission medications   Medication Sig Start Date End Date Taking? Authorizing Provider  amLODipine (NORVASC) 2.5 MG tablet Take 1 tablet (2.5 mg total) by mouth daily. 04/29/19   Rise Mu, PA-C  hydrochlorothiazide (HYDRODIURIL) 25 MG tablet Take 1 tablet (25 mg total) by mouth daily. Patient not taking: Reported on 03/21/2017 11/14/14   Gwyneth Sprout, MD  HYDROcodone-acetaminophen (NORCO/VICODIN) 5-325 MG tablet Take 1 tablet by mouth every 4 (four) hours as needed for severe pain. 04/29/19   Rise Mu, PA-C  indomethacin (INDOCIN) 25 MG capsule Take 1 capsule (25 mg total) by mouth 3 (three) times daily as needed. 07/27/18   Elpidio Anis, PA-C  predniSONE (STERAPRED UNI-PAK 21 TAB) 10 MG (21) TBPK tablet Take by mouth daily for 10 days. Take 4 tabs by mouth daily for 4 days, then 3 tabs for 2 days,  then 2 tabs for 2 days, 1 tab daily for 2 days. 06/14/19 06/24/19  Lorelee New, PA-C    Family History Family History  Problem Relation Age of Onset   Diabetes Other    Hypertension Other     Social History Social History   Tobacco Use   Smoking status: Current Every Day Smoker    Packs/day: 1.00    Types: Cigarettes   Smokeless tobacco: Never Used  Substance Use Topics   Alcohol use: No    Alcohol/week: 0.0 standard drinks   Drug use: No     Allergies   Patient has no known allergies.   Review of Systems Review of Systems  Constitutional: Negative for chills and fever.  Genitourinary: Negative for discharge, dysuria and genital sores.  Musculoskeletal: Positive for arthralgias and joint swelling.  Skin: Positive for color change. Negative for wound.     Physical Exam Updated Vital Signs BP (!) 185/110 (BP Location: Left Arm)    Pulse 75    Temp 99.5 F (37.5 C) (Oral)    Resp 18    Ht 5\' 3"  (1.6 m)    Wt 104.3 kg    SpO2 96%    BMI 40.74 kg/m   Physical Exam Vitals signs and nursing note reviewed. Exam conducted with a chaperone present.  Constitutional:      Appearance: Normal appearance.  HENT:     Head:  Normocephalic and atraumatic.  Eyes:     General: No scleral icterus.    Conjunctiva/sclera: Conjunctivae normal.  Cardiovascular:     Rate and Rhythm: Normal rate and regular rhythm.     Pulses: Normal pulses.     Heart sounds: Normal heart sounds.  Pulmonary:     Effort: Pulmonary effort is normal. No respiratory distress.     Breath sounds: Normal breath sounds.  Musculoskeletal:     Comments: Right foot: Swelling and mild erythema over entire foot, most notably over first MTP joint and medial malleolus.  TTP diffusely, particularly over first MTP and medial malleolus.  Pedal pulse and cap refill intact.  Sensation intact.  No significant warmth noted.  Patient able to flex and dorsiflex ankle without pain, but limited due to swelling.   Pain with ambulation and bearing weight.  Skin:    General: Skin is dry.     Capillary Refill: Capillary refill takes less than 2 seconds.  Neurological:     Mental Status: He is alert.     GCS: GCS eye subscore is 4. GCS verbal subscore is 5. GCS motor subscore is 6.     Sensory: No sensory deficit.  Psychiatric:        Mood and Affect: Mood normal.        Behavior: Behavior normal.        Thought Content: Thought content normal.      ED Treatments / Results  Labs (all labs ordered are listed, but only abnormal results are displayed) Labs Reviewed  I-STAT CHEM 8, ED - Abnormal; Notable for the following components:      Result Value   BUN 21 (*)    Creatinine, Ser 1.30 (*)    Glucose, Bld 105 (*)    All other components within normal limits    EKG None  Radiology No results found.  Procedures Procedures (including critical care time)  Medications Ordered in ED Medications - No data to display   Initial Impression / Assessment and Plan / ED Course  I have reviewed the triage vital signs and the nursing notes.  Pertinent labs & imaging results that were available during my care of the patient were reviewed by me and considered in my medical decision making (see chart for details).       Personally viewed patient's past medical records.  He had joint aspiration previously which demonstrated crystals in aspirate to definitively diagnose gout.  He feels as though this is similar to his previous episodes, but the first time that is affected his ankle and MTP joint.  Patient does not have a fever and is able to mildly dorsiflex and plantar flex his foot without pain, but limited by swelling.  I have low suspicion for septic arthritis.  Discussed with Dr. Gilford Raid and we do not feel as though joint aspirate warranted given recurrence of previously diagnosed gout and low suspicion for septic arthritis. Patient is unable to bear weight due to the pain and discomfort. Denies  any trauma.  Plain films imaging not necessary.  Evidently indomethacin and prednisone have each worked well for him in the past.  He reports that he is in process of getting established with a primary care provider who accepts his [employer].  Provided patient with a tapered prednisone Dosepak given his elevated creatinine and concern for renal impairment with indomethacin.  Provided strict return precautions and cautioned him on prednisone impact on individuals with diabetes.  Cautioned him on hyperglycemia  and diabetic ketoacidosis.  Provided him with other strict return precautions including but not limited to worsening pain and swelling, evidence of infection, fevers or chills, or any other new or worsening symptoms.  Provided him with attachments on gout as well as a low purine eating plan.  Emphasized the importance of getting established with a primary care provider who accepts [employer].  Patient voiced understanding of the return precautions and plan and is agreeable to plan.   Final Clinical Impressions(s) / ED Diagnoses   Final diagnoses:  Acute gout of left foot, unspecified cause    ED Discharge Orders         Ordered    predniSONE (STERAPRED UNI-PAK 21 TAB) 10 MG (21) TBPK tablet  Daily     06/14/19 2347           Lorelee New, PA-C 06/15/19 0002    Jacalyn Lefevre, MD 06/17/19 [id number]

## 2019-06-14 NOTE — Discharge Instructions (Addendum)
Please read the attachments on gout including ways to mitigate risk of a flare.  Return to the ED or seek medical attention should you develop any worsening pain swelling, evidence of infection, fevers or chills, or any other new or worsening symptoms.  You have been prescribed prednisone which might elevate your blood sugars.  Watch for lightheadedness, dizziness, abdominal pain, nausea or vomiting, or other symptoms which may also suggest high blood sugar or diabetic ketoacidosis.  It is imperative that you get established with a primary care provider soon as possible.  Please continue to call around to see which physicians accept your insurance.

## 2019-06-14 NOTE — ED Triage Notes (Signed)
Pt complaint of left foot pain since yesterday; hx of gout; "had seafood for thanksgiving."

## 2019-06-14 NOTE — ED Notes (Signed)
Patient ambulatory with crutches to triage room. Patient states that he has no medication for gout because he has not been able to get into see a primary care provider. Last time he had an issue with gout, it was his left knee. Patient left foot swollen on exam. Pulses equal in both lower extremities.

## 2019-06-14 NOTE — ED Notes (Signed)
Patient states that he has a history of hypertension but is unable to take blood pressure medication due to not having a PCP.

## 2019-10-03 ENCOUNTER — Other Ambulatory Visit: Payer: Self-pay

## 2019-10-03 ENCOUNTER — Encounter (HOSPITAL_COMMUNITY): Payer: Self-pay | Admitting: Emergency Medicine

## 2019-10-03 ENCOUNTER — Emergency Department (HOSPITAL_COMMUNITY)
Admission: EM | Admit: 2019-10-03 | Discharge: 2019-10-03 | Disposition: A | Discharge status: Home / Self Care | Payer: BC Managed Care – PPO | Attending: Emergency Medicine | Admitting: Emergency Medicine

## 2019-10-03 DIAGNOSIS — Z79899 Other long term (current) drug therapy: Secondary | ICD-10-CM | POA: Diagnosis not present

## 2019-10-03 DIAGNOSIS — M79671 Pain in right foot: Secondary | ICD-10-CM | POA: Insufficient documentation

## 2019-10-03 DIAGNOSIS — E119 Type 2 diabetes mellitus without complications: Secondary | ICD-10-CM | POA: Diagnosis not present

## 2019-10-03 DIAGNOSIS — F17210 Nicotine dependence, cigarettes, uncomplicated: Secondary | ICD-10-CM | POA: Diagnosis not present

## 2019-10-03 DIAGNOSIS — I10 Essential (primary) hypertension: Secondary | ICD-10-CM | POA: Insufficient documentation

## 2019-10-03 MED ORDER — INDOMETHACIN 25 MG PO CAPS
50.0000 mg | ORAL_CAPSULE | Freq: Once | ORAL | Status: AC
  Administered 2019-10-03: 50 mg via ORAL
  Filled 2019-10-03: qty 2

## 2019-10-03 MED ORDER — INDOMETHACIN 25 MG PO CAPS: 25.0000 mg | ORAL_CAPSULE | Freq: Three times a day (TID) | ORAL | 0 refills | Status: DC | PRN

## 2019-10-03 MED ORDER — HYDROCODONE-ACETAMINOPHEN 5-325 MG PO TABS: 2.0000 | ORAL_TABLET | ORAL | 0 refills | Status: DC | PRN

## 2019-10-03 NOTE — Discharge Instructions (Signed)
Take medication as prescribed.  Follow-up with your primary care provider.

## 2019-10-03 NOTE — ED Notes (Signed)
ED Provider at bedside. 

## 2019-10-03 NOTE — ED Provider Notes (Signed)
Troy Singh Provider Note   CSN: [id number] Arrival date & time: 10/03/19  1839     History Chief Complaint  Patient presents with  . Foot Pain    Troy Singh is a 49 y.o. male with past medical history significant for pre- diabetes, gout, HTN who presents for evaluation of right foot pain.  Patient states pain feels typical of his gout pain.  He is out of his medications.  Was previously started on Indocin.  Patient had gout diagnosed by arthrocentesis.  He normally has gout flares later in his right knee or right foot.  Patient states his last episode was in his right foot.  He denies any redness, swelling or warmth.  Describes pain as aching.  Pain is located to his first metatarsal on his RLE.  Has been able to walk with crutches.  He has not taken anything for his pain.  Rates his pain a 5/10.  Patient requesting refill of his gout medications.  Denies pain with range of motion to his lower extremity.  Denies additional aggravating alleviating factors.   History obtained from patient and past medical records.  No interpreter is used.  HPI     Past Medical History:  Diagnosis Date  . Diabetes mellitus without complication (HCC)   . Gout   . Hypertension     There are no problems to display for this patient.   Past Surgical History:  Procedure Laterality Date  . PENECTOMY         Family History  Problem Relation Age of Onset  . Diabetes Other   . Hypertension Other     Social History   Tobacco Use  . Smoking status: Current Every Day Smoker    Packs/day: 1.00    Types: Cigarettes  . Smokeless tobacco: Never Used  Substance Use Topics  . Alcohol use: No    Alcohol/week: 0.0 standard drinks  . Drug use: No    Home Medications Prior to Admission medications   Medication Sig Start Date End Date Taking? Authorizing Provider  amLODipine (NORVASC) 2.5 MG tablet Take 1 tablet (2.5 mg total) by mouth daily. 04/29/19    Rise Mu, PA-C  hydrochlorothiazide (HYDRODIURIL) 25 MG tablet Take 1 tablet (25 mg total) by mouth daily. Patient not taking: Reported on 03/21/2017 11/14/14   Gwyneth Sprout, MD  HYDROcodone-acetaminophen (NORCO/VICODIN) 5-325 MG tablet Take 2 tablets by mouth every 4 (four) hours as needed. 10/03/19   Aryn Kops A, PA-C  indomethacin (INDOCIN) 25 MG capsule Take 1 capsule (25 mg total) by mouth 3 (three) times daily as needed. 10/03/19   Vetra Shinall A, PA-C    Allergies    Patient has no known allergies.  Review of Systems   Review of Systems  Constitutional: Negative.   HENT: Negative.   Respiratory: Negative.   Cardiovascular: Negative.   Gastrointestinal: Negative.   Genitourinary: Negative.   Musculoskeletal: Positive for gait problem (Pain with walking). Negative for arthralgias, back pain, joint swelling, myalgias, neck pain and neck stiffness.       Pain to RL 1st MTP.  Skin: Negative.   All other systems reviewed and are negative.   Physical Exam Updated Vital Signs BP (!) 159/104   Pulse 84   Temp 98.2 F (36.8 C)   Resp 17   SpO2 98%   Physical Exam Vitals and nursing note reviewed.  Constitutional:      General: He is not in acute distress.  Appearance: He is well-developed. He is not ill-appearing, toxic-appearing or diaphoretic.  HENT:     Head: Normocephalic and atraumatic.     Nose: Nose normal.     Mouth/Throat:     Mouth: Mucous membranes are moist.     Pharynx: Oropharynx is clear.  Eyes:     Pupils: Pupils are equal, round, and reactive to light.  Cardiovascular:     Rate and Rhythm: Normal rate and regular rhythm.     Pulses: Normal pulses.          Dorsalis pedis pulses are 2+ on the right side and 2+ on the left side.       Posterior tibial pulses are 2+ on the right side and 2+ on the left side.     Heart sounds: Normal heart sounds.  Pulmonary:     Effort: Pulmonary effort is normal. No respiratory distress.      Breath sounds: Normal breath sounds.  Abdominal:     General: Bowel sounds are normal. There is no distension.     Palpations: Abdomen is soft.  Musculoskeletal:        General: Tenderness present. No swelling, deformity or signs of injury. Normal range of motion.     Cervical back: Normal range of motion and neck supple.     Right lower leg: Normal. No edema.     Left lower leg: Normal. No edema.     Right foot: Tenderness present.     Left foot: Normal.       Feet:     Comments: Tenderness to palpation over first MTP on RLE.  No edema, erythema or warmth.  Full range of motion bilateral lower extremity of all joints without difficulty.  Bevelyn Buckles' sign negative.  Compartments soft.  Feet:     Right foot:     Skin integrity: Skin integrity normal.     Left foot:     Skin integrity: Skin integrity normal.     Comments: No erythema or warmth.  Possible very minimal edema to first MTP Skin:    General: Skin is warm and dry.     Capillary Refill: Capillary refill takes less than 2 seconds.     Comments: Brisk capillary fill.  No fluctuance or induration.  Neurological:     General: No focal deficit present.     Mental Status: He is alert.     Sensory: Sensation is intact.     Motor: Motor function is intact.     Coordination: Coordination is intact.     Comments: Intact sensation to lower extremities.  All ambulatory with pain to first digit on right lower extremity     ED Results / Procedures / Treatments   Labs (all labs ordered are listed, but only abnormal results are displayed) Labs Reviewed - No data to display  EKG None  Radiology No results found.  Procedures Procedures (including critical care time)  Medications Ordered in ED Medications  indomethacin (INDOCIN) capsule 50 mg (has no administration in time range)    ED Course  I have reviewed the triage vital signs and the nursing notes.  Pertinent labs & imaging results that were available during my care of  the patient were reviewed by me and considered in my medical decision making (see chart for details).  49 year old male patient otherwise well presents for evaluation of foot pain.  He is afebrile, nonseptic, not ill-appearing.  History of arthrocentesis with diagnosis of gout.  Has multiple recurrent episodes  in his knee and his first MTP.  Patient states similar symptoms in past episodes.  He does have some very minimal swelling and tenderness at his first MTP towards right lower extremity.  No erythema or warmth.  No fluctuance or induration.  He has full passive and active range of motion of his bilateral lower extremities.  Compartments soft.  Normal musculoskeletal exam.  Neurovascularly intact.  Low suspicion for cellulitis, abscess, vascular occlusion, DVT, compartment syndrome, myositis, acute bony abnormality, septic joint, hemarthrosisindocin. Discussed x-ray imaging lower extremity to rule out possible osteomyelitis however I have low suspicion for this.  Shared decision making.  Patient does not want any imaging at this time.  States feels similar to prior gout attack.  We will refill his prior medications.  Will also refer outpatient as he does not have PCP.  He is mildly hypertensive here however denies nausea, vomiting, headache, weakness, chest pain, shortness of breath.  I will suspicion for hypertensive urgency or emergency.  I discussed with patient follow-up with PCP to establish care.  Discussed return precautions.  Patient voiced understanding and is agreeable follow-up.  The patient has been appropriately medically screened and/or stabilized in the ED. I have low suspicion for any other emergent medical condition which would require further screening, evaluation or treatment in the ED or require inpatient management.    MDM Rules/Calculators/A&P                       Final Clinical Impression(s) / ED Diagnoses Final diagnoses:  Right foot pain    Rx / DC Orders ED Discharge  Orders         Ordered    indomethacin (INDOCIN) 25 MG capsule  3 times daily PRN     10/03/19 1935    HYDROcodone-acetaminophen (NORCO/VICODIN) 5-325 MG tablet  Every 4 hours PRN     10/03/19 1937           Jacey Pelc A, PA-C 10/03/19 1937    Mancel Bale, MD 10/04/19 [id number]

## 2019-10-03 NOTE — ED Triage Notes (Signed)
Patient c/o right foot pain x2 days. Hx gout. States he is out of gout medications and does not have a PCP.

## 2021-10-22 ENCOUNTER — Emergency Department (HOSPITAL_COMMUNITY)
Admission: EM | Admit: 2021-10-22 | Discharge: 2021-10-22 | Disposition: A | Discharge status: Home / Self Care | Payer: Self-pay | Attending: Emergency Medicine | Admitting: Emergency Medicine

## 2021-10-22 ENCOUNTER — Emergency Department (HOSPITAL_COMMUNITY): Payer: Self-pay

## 2021-10-22 ENCOUNTER — Encounter (HOSPITAL_COMMUNITY): Payer: Self-pay

## 2021-10-22 DIAGNOSIS — Z79899 Other long term (current) drug therapy: Secondary | ICD-10-CM | POA: Insufficient documentation

## 2021-10-22 DIAGNOSIS — M25642 Stiffness of left hand, not elsewhere classified: Secondary | ICD-10-CM | POA: Insufficient documentation

## 2021-10-22 DIAGNOSIS — R569 Unspecified convulsions: Secondary | ICD-10-CM | POA: Insufficient documentation

## 2021-10-22 DIAGNOSIS — Z8673 Personal history of transient ischemic attack (TIA), and cerebral infarction without residual deficits: Secondary | ICD-10-CM | POA: Insufficient documentation

## 2021-10-22 LAB — CBC WITH DIFFERENTIAL/PLATELET
Abs Immature Granulocytes: 0.08 10*3/uL — ABNORMAL HIGH (ref 0.00–0.07)
Basophils Absolute: 0 10*3/uL (ref 0.0–0.1)
Basophils Relative: 0 %
Eosinophils Absolute: 0.3 10*3/uL (ref 0.0–0.5)
Eosinophils Relative: 3 %
HCT: 37.9 % — ABNORMAL LOW (ref 39.0–52.0)
Hemoglobin: 12.1 g/dL — ABNORMAL LOW (ref 13.0–17.0)
Immature Granulocytes: 1 %
Lymphocytes Relative: 24 %
Lymphs Abs: 2.7 10*3/uL (ref 0.7–4.0)
MCH: 26.7 pg (ref 26.0–34.0)
MCHC: 31.9 g/dL (ref 30.0–36.0)
MCV: 83.7 fL (ref 80.0–100.0)
Monocytes Absolute: 1.1 10*3/uL — ABNORMAL HIGH (ref 0.1–1.0)
Monocytes Relative: 10 %
Neutro Abs: 7.1 10*3/uL (ref 1.7–7.7)
Neutrophils Relative %: 62 %
Platelets: 268 10*3/uL (ref 150–400)
RBC: 4.53 MIL/uL (ref 4.22–5.81)
RDW: 15.9 % — ABNORMAL HIGH (ref 11.5–15.5)
WBC: 11.3 10*3/uL — ABNORMAL HIGH (ref 4.0–10.5)
nRBC: 0 % (ref 0.0–0.2)

## 2021-10-22 LAB — BASIC METABOLIC PANEL
Anion gap: 5 (ref 5–15)
BUN: 24 mg/dL — ABNORMAL HIGH (ref 6–20)
CO2: 24 mmol/L (ref 22–32)
Calcium: 8.9 mg/dL (ref 8.9–10.3)
Chloride: 107 mmol/L (ref 98–111)
Creatinine, Ser: 1.43 mg/dL — ABNORMAL HIGH (ref 0.61–1.24)
GFR, Estimated: 59 mL/min — ABNORMAL LOW (ref >60)
Glucose, Bld: 109 mg/dL — ABNORMAL HIGH (ref 70–99)
Potassium: 4.3 mmol/L (ref 3.5–5.1)
Sodium: 136 mmol/L (ref 135–145)

## 2021-10-22 MED ORDER — LEVETIRACETAM 500 MG PO TABS: 500.0000 mg | ORAL_TABLET | Freq: Two times a day (BID) | ORAL | 0 refills | Status: DC

## 2021-10-22 MED ORDER — LORAZEPAM 2 MG/ML IJ SOLN
1.0000 mg | Freq: Once | INTRAMUSCULAR | Status: AC
  Administered 2021-10-22: 1 mg via INTRAVENOUS
  Filled 2021-10-22: qty 1

## 2021-10-22 MED ORDER — LEVETIRACETAM IN NACL 1500 MG/100ML IV SOLN
1500.0000 mg | Freq: Once | INTRAVENOUS | Status: AC
  Administered 2021-10-22: 1500 mg via INTRAVENOUS
  Filled 2021-10-22: qty 100

## 2021-10-22 NOTE — ED Triage Notes (Signed)
Pt arrived via POV, c/o left arm stiffness x3 episodes that has resolved after 3-4 mins. States it was not numbness, and feeling was not lessened on that side. States the third episode he felt the stiffness up to his face. Denies feeling wkns or numbness.  ?

## 2021-10-22 NOTE — ED Provider Notes (Signed)
?[facility]-EMERGENCY DEPT ?Provider Note ? ? ?CSN: [id number] ?Arrival date & time: 10/22/21  1701 ? ?  ? ?History ? ?Chief Complaint  ?Patient presents with  ? Arm Stiffness  ? ? ?Troy Singh is a 51 y.o. male.  Presents to ER with concern for arm stiffness.  Patient states that today he had 3 episodes that lasted all about 3 to 4 minutes where his left arm got really stiff.  At 1 point he felt like it was spreading to his jaw.  He was conscious throughout this event.  States he was not able to control his arm.  He otherwise feels fine right now.  States that he has been told that he has high blood pressure but does not take medicine for it.  He denies known history of stroke. ? ?HPI ? ?  ? ?Home Medications ?Prior to Admission medications   ?Medication Sig Start Date End Date Taking? Authorizing Provider  ?levETIRAcetam (KEPPRA) 500 MG tablet Take 1 tablet (500 mg total) by mouth 2 (two) times daily. 10/22/21  Yes Milagros Loll, MD  ?amLODipine (NORVASC) 2.5 MG tablet Take 1 tablet (2.5 mg total) by mouth daily. 04/29/19   Rise Mu, PA-C  ?hydrochlorothiazide (HYDRODIURIL) 25 MG tablet Take 1 tablet (25 mg total) by mouth daily. ?Patient not taking: Reported on 03/21/2017 11/14/14   Gwyneth Sprout, MD  ?HYDROcodone-acetaminophen (NORCO/VICODIN) 5-325 MG tablet Take 2 tablets by mouth every 4 (four) hours as needed. 10/03/19   Henderly, Britni A, PA-C  ?indomethacin (INDOCIN) 25 MG capsule Take 1 capsule (25 mg total) by mouth 3 (three) times daily as needed. 10/03/19   Henderly, Britni A, PA-C  ?   ? ?Allergies    ?Patient has no known allergies.   ? ?Review of Systems   ?Review of Systems  ?Constitutional:  Negative for chills and fever.  ?HENT:  Negative for ear pain and sore throat.   ?Eyes:  Negative for pain and visual disturbance.  ?Respiratory:  Negative for cough and shortness of breath.   ?Cardiovascular:  Negative for chest pain and palpitations.  ?Gastrointestinal:   Negative for abdominal pain and vomiting.  ?Genitourinary:  Negative for dysuria and hematuria.  ?Musculoskeletal:  Negative for arthralgias and back pain.  ?Skin:  Negative for color change and rash.  ?Neurological:  Positive for seizures. Negative for syncope.  ?All other systems reviewed and are negative. ? ?Physical Exam ?Updated Vital Signs ?BP 137/86   Pulse 61   Temp 98.7 ?F (37.1 ?C) (Oral)   Resp 18   SpO2 95%  ?Physical Exam ?Vitals and nursing note reviewed.  ?Constitutional:   ?   General: He is not in acute distress. ?   Appearance: He is well-developed.  ?HENT:  ?   Head: Normocephalic and atraumatic.  ?Eyes:  ?   Conjunctiva/sclera: Conjunctivae normal.  ?Cardiovascular:  ?   Rate and Rhythm: Normal rate and regular rhythm.  ?   Heart sounds: No murmur heard. ?Pulmonary:  ?   Effort: Pulmonary effort is normal. No respiratory distress.  ?   Breath sounds: Normal breath sounds.  ?Abdominal:  ?   Palpations: Abdomen is soft.  ?   Tenderness: There is no abdominal tenderness.  ?Musculoskeletal:     ?   General: No swelling.  ?   Cervical back: Neck supple.  ?Skin: ?   General: Skin is warm and dry.  ?   Capillary Refill: Capillary refill takes less than 2 seconds.  ?  Neurological:  ?   Mental Status: He is alert.  ?   Comments: AAOx3 ?CN 2-12 intact, speech clear visual fields intact ?5/5 strength in b/l UE and LE ?Sensation to light touch intact in b/l UE and LE ?Normal FNF ?Normal gait  ?Psychiatric:     ?   Mood and Affect: Mood normal.  ? ? ?ED Results / Procedures / Treatments   ?Labs ?(all labs ordered are listed, but only abnormal results are displayed) ?Labs Reviewed  ?CBC WITH DIFFERENTIAL/PLATELET - Abnormal; Notable for the following components:  ?    Result Value  ? WBC 11.3 (*)   ? Hemoglobin 12.1 (*)   ? HCT 37.9 (*)   ? RDW 15.9 (*)   ? Monocytes Absolute 1.1 (*)   ? Abs Immature Granulocytes 0.08 (*)   ? All other components within normal limits  ?BASIC METABOLIC PANEL - Abnormal;  Notable for the following components:  ? Glucose, Bld 109 (*)   ? BUN 24 (*)   ? Creatinine, Ser 1.43 (*)   ? GFR, Estimated 59 (*)   ? All other components within normal limits  ? ? ?EKG ?EKG Interpretation ? ?Date/Time:  Monday October 22 2021 18:23:24 EDT ?Ventricular Rate:  65 ?PR Interval:  204 ?QRS Duration: 128 ?QT Interval:  445 ?QTC Calculation: 463 ?R Axis:   -76 ?Text Interpretation: Sinus rhythm Borderline prolonged PR interval Probable left atrial enlargement Nonspecific IVCD with LAD Left ventricular hypertrophy ST elev, probable normal early repol pattern Confirmed by Marianna Fuss ([id number]) on 10/22/2021 10:06:11 PM ? ?Radiology ?CT Head Wo Contrast ? ?Result Date: 10/22/2021 ?CLINICAL DATA:  Neuro deficit, acute, stroke suspected EXAM: CT HEAD WITHOUT CONTRAST TECHNIQUE: Contiguous axial images were obtained from the base of the skull through the vertex without intravenous contrast. RADIATION DOSE REDUCTION: This exam was performed according to the departmental dose-optimization program which includes automated exposure control, adjustment of the mA and/or kV according to patient size and/or use of iterative reconstruction technique. COMPARISON:  None. FINDINGS: Brain: There is no acute intracranial hemorrhage, mass effect, or edema. Age-indeterminate small cortical/subcortical right frontal infarcts involving superior and middle gyrus as well as the medial precentral gyrus. There is no extra-axial fluid collection. Ventricles and sulci are within normal limits in size and configuration. Vascular: No hyperdense vessel.There is atherosclerotic calcification at the skull base. Skull: Calvarium is unremarkable. Sinuses/Orbits: Pansinus mucosal thickening with near total opacification. Other: None. IMPRESSION: No acute intracranial hemorrhage. Age-indeterminate small cortical/subcortical infarcts of the right frontal lobe. MRI is recommended. Near total paranasal sinus opacification. Acute sinusitis is  possible in the appropriate setting. Electronically Signed   By: Guadlupe Spanish M.D.   On: 10/22/2021 18:21  ? ?MR BRAIN WO CONTRAST ? ?Result Date: 10/22/2021 ?CLINICAL DATA:  Neuro deficit, acute, stroke suspected left arm weakness, concern for stroke EXAM: MRI HEAD WITHOUT CONTRAST TECHNIQUE: Multiplanar, multiecho pulse sequences of the brain and surrounding structures were obtained without intravenous contrast. COMPARISON:  None. FINDINGS: Brain: There is no acute infarction or intracranial hemorrhage. Cortical/subcortical infarcts of the right frontal lobe seen on CT are chronic. There is some associated susceptibility reflecting chronic blood products. Additional scattered small foci of T2 hyperintensity in the supratentorial white matter are nonspecific but probably reflect mild chronic microvascular ischemic changes. Minimal foci of bilateral putaminal susceptibility likely reflect chronic microhemorrhages secondary to hypertension. There is no intracranial mass, mass effect, or edema. There is no hydrocephalus or extra-axial fluid collection. Ventricles and sulci are normal  in size and configuration. Vascular: Major vessel flow voids at the skull base are preserved. Skull and upper cervical spine: Normal marrow signal is preserved. Sinuses/Orbits: As noted on the prior CT, there is near total opacification of the paranasal sinuses. Orbits are unremarkable. Other: Sella is unremarkable.  Mastoid air cells are clear. IMPRESSION: Chronic right frontal cortical/subcortical infarcts. No acute infarct, hemorrhage, or mass. Mild chronic microvascular ischemic changes. Mild burden of chronic microhemorrhages secondary to hypertension. Electronically Signed   By: Guadlupe Spanish M.D.   On: 10/22/2021 20:03   ? ?Procedures ?Procedures  ? ? ?Medications Ordered in ED ?Medications  ?levETIRAcetam (KEPPRA) IVPB 1500 mg/ 100 mL premix (0 mg Intravenous Stopped 10/22/21 2202)  ?LORazepam (ATIVAN) injection 1 mg (1 mg  Intravenous Given 10/22/21 2136)  ? ? ?ED Course/ Medical Decision Making/ A&P ?  ?                        ?Medical Decision Making ?Amount and/or Complexity of Data Reviewed ?Labs: ordered. ?Radiology: ordered. ? ?Risk ?

## 2021-10-22 NOTE — Discharge Instructions (Addendum)
Please follow-up with neurology.  Come back to ER if you have any further episodes of arm shaking or seizure activity or other new concerning symptom. ? ? ?

## 2021-11-12 ENCOUNTER — Inpatient Hospital Stay: Payer: BLUE CROSS/BLUE SHIELD | Admitting: Neurology

## 2022-06-19 ENCOUNTER — Emergency Department (HOSPITAL_COMMUNITY): Payer: Self-pay

## 2022-06-19 ENCOUNTER — Other Ambulatory Visit: Payer: Self-pay

## 2022-06-19 ENCOUNTER — Inpatient Hospital Stay (HOSPITAL_COMMUNITY)
Admission: EM | Admit: 2022-06-19 | Discharge: 2022-06-26 | DRG: 280 | Disposition: A | Discharge status: Home / Self Care | Payer: Self-pay | Attending: Student | Admitting: Student

## 2022-06-19 DIAGNOSIS — Z8673 Personal history of transient ischemic attack (TIA), and cerebral infarction without residual deficits: Secondary | ICD-10-CM

## 2022-06-19 DIAGNOSIS — E854 Organ-limited amyloidosis: Secondary | ICD-10-CM | POA: Insufficient documentation

## 2022-06-19 DIAGNOSIS — R7989 Other specified abnormal findings of blood chemistry: Secondary | ICD-10-CM

## 2022-06-19 DIAGNOSIS — R509 Fever, unspecified: Secondary | ICD-10-CM | POA: Insufficient documentation

## 2022-06-19 DIAGNOSIS — I2583 Coronary atherosclerosis due to lipid rich plaque: Secondary | ICD-10-CM | POA: Diagnosis present

## 2022-06-19 DIAGNOSIS — E1122 Type 2 diabetes mellitus with diabetic chronic kidney disease: Secondary | ICD-10-CM | POA: Diagnosis present

## 2022-06-19 DIAGNOSIS — I420 Dilated cardiomyopathy: Secondary | ICD-10-CM | POA: Insufficient documentation

## 2022-06-19 DIAGNOSIS — I272 Pulmonary hypertension, unspecified: Secondary | ICD-10-CM | POA: Diagnosis present

## 2022-06-19 DIAGNOSIS — N179 Acute kidney failure, unspecified: Secondary | ICD-10-CM

## 2022-06-19 DIAGNOSIS — I5043 Acute on chronic combined systolic (congestive) and diastolic (congestive) heart failure: Secondary | ICD-10-CM | POA: Diagnosis present

## 2022-06-19 DIAGNOSIS — I5041 Acute combined systolic (congestive) and diastolic (congestive) heart failure: Secondary | ICD-10-CM

## 2022-06-19 DIAGNOSIS — I43 Cardiomyopathy in diseases classified elsewhere: Secondary | ICD-10-CM | POA: Diagnosis present

## 2022-06-19 DIAGNOSIS — Z91148 Patient's other noncompliance with medication regimen for other reason: Secondary | ICD-10-CM

## 2022-06-19 DIAGNOSIS — I161 Hypertensive emergency: Secondary | ICD-10-CM | POA: Diagnosis present

## 2022-06-19 DIAGNOSIS — J189 Pneumonia, unspecified organism: Secondary | ICD-10-CM | POA: Insufficient documentation

## 2022-06-19 DIAGNOSIS — Z8249 Family history of ischemic heart disease and other diseases of the circulatory system: Secondary | ICD-10-CM

## 2022-06-19 DIAGNOSIS — I472 Ventricular tachycardia, unspecified: Secondary | ICD-10-CM | POA: Diagnosis not present

## 2022-06-19 DIAGNOSIS — Z79899 Other long term (current) drug therapy: Secondary | ICD-10-CM

## 2022-06-19 DIAGNOSIS — Z597 Insufficient social insurance and welfare support: Secondary | ICD-10-CM

## 2022-06-19 DIAGNOSIS — I440 Atrioventricular block, first degree: Secondary | ICD-10-CM | POA: Diagnosis present

## 2022-06-19 DIAGNOSIS — I214 Non-ST elevation (NSTEMI) myocardial infarction: Secondary | ICD-10-CM | POA: Diagnosis present

## 2022-06-19 DIAGNOSIS — J9601 Acute respiratory failure with hypoxia: Secondary | ICD-10-CM | POA: Diagnosis present

## 2022-06-19 DIAGNOSIS — Z9189 Other specified personal risk factors, not elsewhere classified: Secondary | ICD-10-CM

## 2022-06-19 DIAGNOSIS — I130 Hypertensive heart and chronic kidney disease with heart failure and stage 1 through stage 4 chronic kidney disease, or unspecified chronic kidney disease: Principal | ICD-10-CM | POA: Diagnosis present

## 2022-06-19 DIAGNOSIS — E7800 Pure hypercholesterolemia, unspecified: Secondary | ICD-10-CM

## 2022-06-19 DIAGNOSIS — Z833 Family history of diabetes mellitus: Secondary | ICD-10-CM

## 2022-06-19 DIAGNOSIS — Z20822 Contact with and (suspected) exposure to covid-19: Secondary | ICD-10-CM | POA: Diagnosis present

## 2022-06-19 DIAGNOSIS — N1831 Chronic kidney disease, stage 3a: Secondary | ICD-10-CM | POA: Diagnosis present

## 2022-06-19 DIAGNOSIS — I4729 Other ventricular tachycardia: Secondary | ICD-10-CM | POA: Insufficient documentation

## 2022-06-19 DIAGNOSIS — M109 Gout, unspecified: Secondary | ICD-10-CM | POA: Insufficient documentation

## 2022-06-19 DIAGNOSIS — I509 Heart failure, unspecified: Secondary | ICD-10-CM

## 2022-06-19 DIAGNOSIS — I2510 Atherosclerotic heart disease of native coronary artery without angina pectoris: Secondary | ICD-10-CM

## 2022-06-19 DIAGNOSIS — F17210 Nicotine dependence, cigarettes, uncomplicated: Secondary | ICD-10-CM | POA: Diagnosis present

## 2022-06-19 DIAGNOSIS — Z6841 Body Mass Index (BMI) 40.0 and over, adult: Secondary | ICD-10-CM

## 2022-06-19 DIAGNOSIS — E119 Type 2 diabetes mellitus without complications: Secondary | ICD-10-CM

## 2022-06-19 LAB — CBC WITH DIFFERENTIAL/PLATELET
Abs Immature Granulocytes: 0.07 10*3/uL (ref 0.00–0.07)
Basophils Absolute: 0.1 10*3/uL (ref 0.0–0.1)
Basophils Relative: 0 %
Eosinophils Absolute: 0.3 10*3/uL (ref 0.0–0.5)
Eosinophils Relative: 2 %
HCT: 42 % (ref 39.0–52.0)
Hemoglobin: 12.6 g/dL — ABNORMAL LOW (ref 13.0–17.0)
Immature Granulocytes: 0 %
Lymphocytes Relative: 7 %
Lymphs Abs: 1.1 10*3/uL (ref 0.7–4.0)
MCH: 26.9 pg (ref 26.0–34.0)
MCHC: 30 g/dL (ref 30.0–36.0)
MCV: 89.7 fL (ref 80.0–100.0)
Monocytes Absolute: 0.8 10*3/uL (ref 0.1–1.0)
Monocytes Relative: 5 %
Neutro Abs: 13.4 10*3/uL — ABNORMAL HIGH (ref 1.7–7.7)
Neutrophils Relative %: 86 %
Platelets: 336 10*3/uL (ref 150–400)
RBC: 4.68 MIL/uL (ref 4.22–5.81)
RDW: 15.8 % — ABNORMAL HIGH (ref 11.5–15.5)
WBC: 15.7 10*3/uL — ABNORMAL HIGH (ref 4.0–10.5)
nRBC: 0 % (ref 0.0–0.2)

## 2022-06-19 LAB — BLOOD GAS, VENOUS
Acid-base deficit: 5.3 mmol/L — ABNORMAL HIGH (ref 0.0–2.0)
Bicarbonate: 22 mmol/L (ref 20.0–28.0)
O2 Saturation: 32.4 %
Patient temperature: 37
pCO2, Ven: 49 mmHg (ref 44–60)
pH, Ven: 7.26 (ref 7.25–7.43)
pO2, Ven: <31 mmHg — CL (ref 32–45)

## 2022-06-19 LAB — I-STAT CHEM 8, ED
BUN: 34 mg/dL — ABNORMAL HIGH (ref 6–20)
Calcium, Ion: 1.03 mmol/L — ABNORMAL LOW (ref 1.15–1.40)
Chloride: 107 mmol/L (ref 98–111)
Creatinine, Ser: 1.7 mg/dL — ABNORMAL HIGH (ref 0.61–1.24)
Glucose, Bld: 255 mg/dL — ABNORMAL HIGH (ref 70–99)
HCT: 42 % (ref 39.0–52.0)
Hemoglobin: 14.3 g/dL (ref 13.0–17.0)
Potassium: 4.5 mmol/L (ref 3.5–5.1)
Sodium: 139 mmol/L (ref 135–145)
TCO2: 20 mmol/L — ABNORMAL LOW (ref 22–32)

## 2022-06-19 LAB — TROPONIN I (HIGH SENSITIVITY): Troponin I (High Sensitivity): 28 ng/L — ABNORMAL HIGH (ref <18)

## 2022-06-19 MED ORDER — AZITHROMYCIN 250 MG PO TABS: 250.0000 mg | ORAL_TABLET | Freq: Every day | ORAL | Status: DC

## 2022-06-19 MED ORDER — AZITHROMYCIN 250 MG PO TABS: 500.0000 mg | ORAL_TABLET | Freq: Every day | ORAL | Status: DC

## 2022-06-19 MED ORDER — IOHEXOL 350 MG/ML SOLN
75.0000 mL | Freq: Once | INTRAVENOUS | Status: AC | PRN
  Administered 2022-06-19: 75 mL via INTRAVENOUS

## 2022-06-19 MED ORDER — SODIUM CHLORIDE 0.9 % IV SOLN
2.0000 g | Freq: Once | INTRAVENOUS | Status: AC
  Administered 2022-06-20: 2 g via INTRAVENOUS
  Filled 2022-06-19: qty 20

## 2022-06-19 MED ORDER — SODIUM CHLORIDE 0.9 % IV SOLN
500.0000 mg | Freq: Once | INTRAVENOUS | Status: AC
  Administered 2022-06-20: 500 mg via INTRAVENOUS
  Filled 2022-06-19: qty 5

## 2022-06-19 NOTE — Progress Notes (Signed)
PT in resp distress on arrival placed on BiPAP per MD.

## 2022-06-19 NOTE — ED Provider Notes (Signed)
[facility]-EMERGENCY DEPT Provider Note  CSN: [id number] Arrival date & time: 06/19/22 2118  Chief Complaint(s) Respiratory Distress  HPI Troy Singh is a 51 y.o. male with PMH T2DM, gout, HTN who presents emergency department for evaluation of shortness of breath.  Patient states that he has had gradually worsening breath over the last 2 hours where he had [place] side of the highway to catch his breath.  Patient found to be 57% on room air by EMS and arrived 78% on CPAP with 100% FiO2.  Patient given 2 sublingual nitro tablets prior to arrival for hypertension noted on scene.  Patient arrives with asymmetric swelling with left lower extremity greater than right, tachypnea, accessory muscle use and hypoxia.  Denies chest pain, abdominal pain, nausea, vomiting or other systemic symptoms.   Past Medical History Past Medical History:  Diagnosis Date   Diabetes mellitus without complication (HCC)    Gout    Hypertension    There are no problems to display for this patient.  Home Medication(s) Prior to Admission medications   Medication Sig Start Date End Date Taking? Authorizing Provider  amLODipine (NORVASC) 2.5 MG tablet Take 1 tablet (2.5 mg total) by mouth daily. 04/29/19   Rise Mu, PA-C  hydrochlorothiazide (HYDRODIURIL) 25 MG tablet Take 1 tablet (25 mg total) by mouth daily. Patient not taking: Reported on 03/21/2017 11/14/14   Gwyneth Sprout, MD  HYDROcodone-acetaminophen (NORCO/VICODIN) 5-325 MG tablet Take 2 tablets by mouth every 4 (four) hours as needed. 10/03/19   Henderly, Britni A, PA-C  indomethacin (INDOCIN) 25 MG capsule Take 1 capsule (25 mg total) by mouth 3 (three) times daily as needed. 10/03/19   Henderly, Britni A, PA-C  levETIRAcetam (KEPPRA) 500 MG tablet Take 1 tablet (500 mg total) by mouth 2 (two) times daily. 10/22/21   Milagros Loll, MD                                                                                                                                     Past Surgical History Past Surgical History:  Procedure Laterality Date   PENECTOMY     Family History Family History  Problem Relation Age of Onset   Diabetes Other    Hypertension Other     Social History Social History   Tobacco Use   Smoking status: Every Day    Packs/day: 1.00    Types: Cigarettes   Smokeless tobacco: Never  Vaping Use   Vaping Use: Never used  Substance Use Topics   Alcohol use: No    Alcohol/week: 0.0 standard drinks of alcohol   Drug use: No   Allergies Patient has no known allergies.  Review of Systems Review of Systems  Respiratory:  Positive for cough, chest tightness and shortness of breath.   Cardiovascular:  Positive for leg swelling.    Physical Exam Vital Signs  I have reviewed  the triage vital signs BP (!) 163/117   Pulse 93   Temp 97.7 F (36.5 C) (Axillary)   Resp (!) 28   Ht 5\' 3"  (1.6 m)   Wt 104.3 kg   SpO2 99%   BMI 40.73 kg/m   Physical Exam Constitutional:      General: He is in acute distress.     Appearance: Normal appearance. He is ill-appearing.  HENT:     Head: Normocephalic and atraumatic.     Nose: No congestion or rhinorrhea.  Eyes:     General:        Right eye: No discharge.        Left eye: No discharge.     Extraocular Movements: Extraocular movements intact.     Pupils: Pupils are equal, round, and reactive to light.  Cardiovascular:     Rate and Rhythm: Regular rhythm. Tachycardia present.     Heart sounds: No murmur heard. Pulmonary:     Effort: Respiratory distress present.     Breath sounds: Rales present. No wheezing.  Abdominal:     General: There is no distension.     Tenderness: There is no abdominal tenderness.  Musculoskeletal:        General: Normal range of motion.     Cervical back: Normal range of motion.     Right lower leg: Edema present.     Left lower leg: Edema present.  Skin:    General: Skin is warm and dry.   Neurological:     General: No focal deficit present.     Mental Status: He is alert.     ED Results and Treatments Labs (all labs ordered are listed, but only abnormal results are displayed) Labs Reviewed  I-STAT CHEM 8, ED - Abnormal; Notable for the following components:      Result Value   BUN 34 (*)    Creatinine, Ser 1.70 (*)    Glucose, Bld 255 (*)    Calcium, Ion 1.03 (*)    TCO2 20 (*)    All other components within normal limits  COMPREHENSIVE METABOLIC PANEL  CBC WITH DIFFERENTIAL/PLATELET  BLOOD GAS, VENOUS  BRAIN NATRIURETIC PEPTIDE  TROPONIN I (HIGH SENSITIVITY)                                                                                                                          Radiology No results found.  Pertinent labs & imaging results that were available during my care of the patient were reviewed by me and considered in my medical decision making (see MDM for details).  Medications Ordered in ED Medications - No data to display  Procedures .Critical Care  Performed by: Teressa Lower, MD Authorized by: Teressa Lower, MD   Critical care provider statement:    Critical care time (minutes):  75   Critical care was time spent personally by me on the following activities:  Development of treatment plan with patient or surrogate, discussions with consultants, evaluation of patient's response to treatment, examination of patient, ordering and review of laboratory studies, ordering and review of radiographic studies, ordering and performing treatments and interventions, pulse oximetry, re-evaluation of patient's condition and review of old charts   (including critical care time)  Medical Decision Making / ED Course   This patient presents to the ED for concern of shortness of breath, this involves an extensive number of  treatment options, and is a complaint that carries with it a high risk of complications and morbidity.  The differential diagnosis includes flash pulmonary edema, pneumonia, COPD, CHF, PE  MDM: Patient seen emergency room for evaluation of shortness of breath.  Physical exam reveals an ill-appearing tachypneic patient with accessory muscle use on CPAP with rales at the bases, asymmetric swelling left greater than right lower extremities.  Laboratory evaluation with a leukocytosis to 15.7, pH 7.26 with no significant hypercarbia, creatinine 1.7 on i-STAT.  Chest x-ray with bilateral infiltrates concerning for infection versus pulmonary edema.  Transition to BiPAP with significant proved and oxygen saturations is currently saturating 100% with improvement of his respiratory effort.  CT PE obtained given acute onset shortness of breath with asymmetric lower extremity edema that shows no embolus but does show cardiomegaly with extensive bilateral groundglass opacities.  Antibiotics initiated and patient signed out to oncoming provider.  Anticipate admission and likely nitro drip initiation.   Additional history obtained:  -External records from outside source obtained and reviewed including: Chart review including previous notes, labs, imaging, consultation notes   Lab Tests: -I ordered, reviewed, and interpreted labs.   The pertinent results include:   Labs Reviewed  I-STAT CHEM 8, ED - Abnormal; Notable for the following components:      Result Value   BUN 34 (*)    Creatinine, Ser 1.70 (*)    Glucose, Bld 255 (*)    Calcium, Ion 1.03 (*)    TCO2 20 (*)    All other components within normal limits  COMPREHENSIVE METABOLIC PANEL  CBC WITH DIFFERENTIAL/PLATELET  BLOOD GAS, VENOUS  BRAIN NATRIURETIC PEPTIDE  TROPONIN I (HIGH SENSITIVITY)      EKG   EKG Interpretation  Date/Time:  Thursday June 20 2022 03:15:28 EST Ventricular Rate:  76 PR Interval:  203 QRS Duration: 130 QT  Interval:  457 QTC Calculation: 514 R Axis:   -63 Text Interpretation: Sinus rhythm Borderline prolonged PR interval Probable left atrial enlargement Nonspecific IVCD with LAD Left ventricular hypertrophy Nonspecific T abnormalities, lateral leads ST elev, probable normal early repol pattern No significant change since last tracing Confirmed by Orpah Greek [phone]) on 06/20/2022 3:17:51 AM         Imaging Studies ordered: I ordered imaging studies including chest x-ray, CT PE I independently visualized and interpreted imaging. I agree with the radiologist interpretation   Medicines ordered and prescription drug management: No orders of the defined types were placed in this encounter.   -I have reviewed the patients home medicines and have made adjustments as needed  Critical interventions BiPAP, antibiotics  Cardiac Monitoring: The patient was maintained on a cardiac monitor.  I personally viewed and interpreted the cardiac monitored which showed an  underlying rhythm of: Sinus tachycardia, NSR  Social Determinants of Health:  Factors impacting patients care include: none   Reevaluation: After the interventions noted above, I reevaluated the patient and found that they have :improved  Co morbidities that complicate the patient evaluation  Past Medical History:  Diagnosis Date   Diabetes mellitus without complication ([place])    Gout    Hypertension       Dispostion: I considered admission for this patient, and patient ultimately will require hospital admission for respiratory failure     Final Clinical Impression(s) / ED Diagnoses Final diagnoses:  None     @PCDICTATION @    Teressa Lower, MD 06/20/22 1207

## 2022-06-19 NOTE — ED Notes (Signed)
Patient transported to CT 

## 2022-06-19 NOTE — ED Triage Notes (Signed)
BIBA from home for respiratory distress, when EMS arrived was at 57% RA was having SHOB for last 2 hours, on CPAP given 2 Nitro, highest 78% on CPAP, 20RAC

## 2022-06-20 ENCOUNTER — Inpatient Hospital Stay (HOSPITAL_COMMUNITY): Payer: Self-pay

## 2022-06-20 ENCOUNTER — Encounter (HOSPITAL_COMMUNITY): Payer: Self-pay | Admitting: Pulmonary Disease

## 2022-06-20 DIAGNOSIS — R7989 Other specified abnormal findings of blood chemistry: Secondary | ICD-10-CM

## 2022-06-20 DIAGNOSIS — I161 Hypertensive emergency: Secondary | ICD-10-CM

## 2022-06-20 DIAGNOSIS — E119 Type 2 diabetes mellitus without complications: Secondary | ICD-10-CM

## 2022-06-20 DIAGNOSIS — I160 Hypertensive urgency: Secondary | ICD-10-CM

## 2022-06-20 DIAGNOSIS — I509 Heart failure, unspecified: Secondary | ICD-10-CM

## 2022-06-20 LAB — COMPREHENSIVE METABOLIC PANEL
ALT: 18 U/L (ref 0–44)
ALT: 20 U/L (ref 0–44)
AST: 43 U/L — ABNORMAL HIGH (ref 15–41)
AST: 58 U/L — ABNORMAL HIGH (ref 15–41)
Albumin: 3.1 g/dL — ABNORMAL LOW (ref 3.5–5.0)
Albumin: 3.3 g/dL — ABNORMAL LOW (ref 3.5–5.0)
Alkaline Phosphatase: 60 U/L (ref 38–126)
Alkaline Phosphatase: 60 U/L (ref 38–126)
Anion gap: 5 (ref 5–15)
Anion gap: 8 (ref 5–15)
BUN: 25 mg/dL — ABNORMAL HIGH (ref 6–20)
BUN: 25 mg/dL — ABNORMAL HIGH (ref 6–20)
CO2: 26 mmol/L (ref 22–32)
CO2: 23 mmol/L (ref 22–32)
Calcium: 8.8 mg/dL — ABNORMAL LOW (ref 8.9–10.3)
Calcium: 9 mg/dL (ref 8.9–10.3)
Chloride: 106 mmol/L (ref 98–111)
Chloride: 109 mmol/L (ref 98–111)
Creatinine, Ser: 1.67 mg/dL — ABNORMAL HIGH (ref 0.61–1.24)
Creatinine, Ser: 1.56 mg/dL — ABNORMAL HIGH (ref 0.61–1.24)
GFR, Estimated: 49 mL/min — ABNORMAL LOW (ref >60)
GFR, Estimated: 53 mL/min — ABNORMAL LOW (ref >60)
Glucose, Bld: 87 mg/dL (ref 70–99)
Glucose, Bld: 96 mg/dL (ref 70–99)
Potassium: 3.8 mmol/L (ref 3.5–5.1)
Potassium: 4.5 mmol/L (ref 3.5–5.1)
Sodium: 137 mmol/L (ref 135–145)
Sodium: 140 mmol/L (ref 135–145)
Total Bilirubin: 0.4 mg/dL (ref 0.3–1.2)
Total Bilirubin: 0.6 mg/dL (ref 0.3–1.2)
Total Protein: 7.3 g/dL (ref 6.5–8.1)
Total Protein: 7.4 g/dL (ref 6.5–8.1)

## 2022-06-20 LAB — ECHOCARDIOGRAM COMPLETE
Area-P 1/2: 6.17 cm2
Calc EF: 33.8 %
Height: 63 in
S' Lateral: 5.5 cm
Single Plane A2C EF: 35.6 %
Single Plane A4C EF: 35.5 %
Weight: 3679.04 oz

## 2022-06-20 LAB — BLOOD GAS, ARTERIAL
Acid-Base Excess: 0.7 mmol/L (ref 0.0–2.0)
Bicarbonate: 25.4 mmol/L (ref 20.0–28.0)
Delivery systems: POSITIVE
Expiratory PAP: 6 cmH2O
FIO2: 40 %
Inspiratory PAP: 12 cmH2O
O2 Saturation: 98.7 %
Patient temperature: 36.9
RATE: 8 resp/min
pCO2 arterial: 40 mmHg (ref 32–48)
pH, Arterial: 7.41 (ref 7.35–7.45)
pO2, Arterial: 83 mmHg (ref 83–108)

## 2022-06-20 LAB — HIV ANTIBODY (ROUTINE TESTING W REFLEX): HIV Screen 4th Generation wRfx: NONREACTIVE

## 2022-06-20 LAB — GLUCOSE, CAPILLARY
Glucose-Capillary: 91 mg/dL (ref 70–99)
Glucose-Capillary: 90 mg/dL (ref 70–99)
Glucose-Capillary: 104 mg/dL — ABNORMAL HIGH (ref 70–99)

## 2022-06-20 LAB — BASIC METABOLIC PANEL
Anion gap: 11 (ref 5–15)
BUN: 24 mg/dL — ABNORMAL HIGH (ref 6–20)
CO2: 24 mmol/L (ref 22–32)
Calcium: 9.2 mg/dL (ref 8.9–10.3)
Chloride: 106 mmol/L (ref 98–111)
Creatinine, Ser: 1.42 mg/dL — ABNORMAL HIGH (ref 0.61–1.24)
GFR, Estimated: 59 mL/min — ABNORMAL LOW (ref >60)
Glucose, Bld: 103 mg/dL — ABNORMAL HIGH (ref 70–99)
Potassium: 4.3 mmol/L (ref 3.5–5.1)
Sodium: 141 mmol/L (ref 135–145)

## 2022-06-20 LAB — BRAIN NATRIURETIC PEPTIDE: B Natriuretic Peptide: 459.4 pg/mL — ABNORMAL HIGH (ref 0.0–100.0)

## 2022-06-20 LAB — HEPARIN LEVEL (UNFRACTIONATED)
Heparin Unfractionated: <0.1 IU/mL — ABNORMAL LOW (ref 0.30–0.70)
Heparin Unfractionated: 0.13 IU/mL — ABNORMAL LOW (ref 0.30–0.70)

## 2022-06-20 LAB — TROPONIN I (HIGH SENSITIVITY)
Troponin I (High Sensitivity): 1937 ng/L (ref <18)
Troponin I (High Sensitivity): 4928 ng/L (ref <18)
Troponin I (High Sensitivity): 4835 ng/L (ref <18)
Troponin I (High Sensitivity): 4637 ng/L (ref <18)

## 2022-06-20 LAB — MRSA NEXT GEN BY PCR, NASAL: MRSA by PCR Next Gen: NOT DETECTED

## 2022-06-20 LAB — TSH: TSH: 1.26 u[IU]/mL (ref 0.350–4.500)

## 2022-06-20 LAB — RESP PANEL BY RT-PCR (FLU A&B, COVID) ARPGX2
Influenza A by PCR: NEGATIVE
Influenza B by PCR: NEGATIVE
SARS Coronavirus 2 by RT PCR: NEGATIVE

## 2022-06-20 LAB — PROCALCITONIN: Procalcitonin: 3.28 ng/mL

## 2022-06-20 LAB — MAGNESIUM: Magnesium: 2.2 mg/dL (ref 1.7–2.4)

## 2022-06-20 MED ORDER — ORAL CARE MOUTH RINSE
15.0000 mL | OROMUCOSAL | Status: DC
  Administered 2022-06-20 – 2022-06-26 (×19): 15 mL via OROMUCOSAL

## 2022-06-20 MED ORDER — FUROSEMIDE 10 MG/ML IJ SOLN
20.0000 mg | Freq: Once | INTRAMUSCULAR | Status: AC
  Administered 2022-06-20: 20 mg via INTRAVENOUS
  Filled 2022-06-20: qty 4

## 2022-06-20 MED ORDER — SODIUM CHLORIDE 0.9 % IV SOLN
2.0000 g | INTRAVENOUS | Status: AC
  Administered 2022-06-20 – 2022-06-23 (×4): 2 g via INTRAVENOUS
  Filled 2022-06-20 (×4): qty 20

## 2022-06-20 MED ORDER — HEPARIN BOLUS VIA INFUSION
4000.0000 [IU] | Freq: Once | INTRAVENOUS | Status: AC
  Administered 2022-06-20: 4000 [IU] via INTRAVENOUS
  Filled 2022-06-20: qty 4000

## 2022-06-20 MED ORDER — CHLORHEXIDINE GLUCONATE CLOTH 2 % EX PADS
6.0000 | MEDICATED_PAD | Freq: Every day | CUTANEOUS | Status: DC
  Administered 2022-06-20 – 2022-06-21 (×2): 6 via TOPICAL

## 2022-06-20 MED ORDER — HEPARIN BOLUS VIA INFUSION
2500.0000 [IU] | INTRAVENOUS | Status: AC
  Administered 2022-06-20: 2500 [IU] via INTRAVENOUS
  Filled 2022-06-20: qty 2500

## 2022-06-20 MED ORDER — DOCUSATE SODIUM 100 MG PO CAPS: 100.0000 mg | ORAL_CAPSULE | Freq: Two times a day (BID) | ORAL | Status: DC | PRN

## 2022-06-20 MED ORDER — HYDRALAZINE HCL 10 MG PO TABS
10.0000 mg | ORAL_TABLET | Freq: Three times a day (TID) | ORAL | Status: DC
  Administered 2022-06-20 – 2022-06-22 (×6): 10 mg via ORAL
  Filled 2022-06-20 (×6): qty 1

## 2022-06-20 MED ORDER — POLYETHYLENE GLYCOL 3350 17 G PO PACK: 17.0000 g | PACK | Freq: Every day | ORAL | Status: DC | PRN

## 2022-06-20 MED ORDER — PERFLUTREN LIPID MICROSPHERE
1.0000 mL | INTRAVENOUS | Status: AC | PRN
  Administered 2022-06-20: 4 mL via INTRAVENOUS

## 2022-06-20 MED ORDER — ASPIRIN 81 MG PO CHEW
81.0000 mg | CHEWABLE_TABLET | ORAL | Status: AC
  Administered 2022-06-21: 81 mg via ORAL
  Filled 2022-06-20: qty 1

## 2022-06-20 MED ORDER — ATORVASTATIN CALCIUM 40 MG PO TABS
40.0000 mg | ORAL_TABLET | Freq: Every day | ORAL | Status: DC
  Administered 2022-06-20 – 2022-06-26 (×7): 40 mg via ORAL
  Filled 2022-06-20 (×7): qty 1

## 2022-06-20 MED ORDER — HEPARIN (PORCINE) 25000 UT/250ML-% IV SOLN
1000.0000 [IU]/h | INTRAVENOUS | Status: DC
  Administered 2022-06-20: 1000 [IU]/h via INTRAVENOUS
  Filled 2022-06-20: qty 250

## 2022-06-20 MED ORDER — HEPARIN (PORCINE) 25000 UT/250ML-% IV SOLN
1700.0000 [IU]/h | INTRAVENOUS | Status: DC
  Administered 2022-06-20: 1300 [IU]/h via INTRAVENOUS
  Filled 2022-06-20: qty 250

## 2022-06-20 MED ORDER — ORAL CARE MOUTH RINSE: 15.0000 mL | OROMUCOSAL | Status: DC | PRN

## 2022-06-20 MED ORDER — NITROGLYCERIN IN D5W 200-5 MCG/ML-% IV SOLN
0.0000 ug/min | INTRAVENOUS | Status: DC
  Administered 2022-06-20: 5 ug/min via INTRAVENOUS
  Filled 2022-06-20: qty 250

## 2022-06-20 MED ORDER — SODIUM CHLORIDE 0.9% FLUSH
3.0000 mL | Freq: Two times a day (BID) | INTRAVENOUS | Status: DC
  Administered 2022-06-20 – 2022-06-26 (×9): 3 mL via INTRAVENOUS

## 2022-06-20 MED ORDER — AMLODIPINE BESYLATE 5 MG PO TABS
5.0000 mg | ORAL_TABLET | Freq: Every day | ORAL | Status: DC
  Administered 2022-06-20 – 2022-06-22 (×3): 5 mg via ORAL
  Filled 2022-06-20 (×3): qty 1

## 2022-06-20 MED ORDER — SODIUM CHLORIDE 0.9 % IV SOLN
500.0000 mg | INTRAVENOUS | Status: DC
  Administered 2022-06-21: 500 mg via INTRAVENOUS
  Filled 2022-06-20: qty 5

## 2022-06-20 MED ORDER — ASPIRIN 81 MG PO TBEC
81.0000 mg | DELAYED_RELEASE_TABLET | Freq: Every day | ORAL | Status: DC
  Administered 2022-06-20 – 2022-06-26 (×6): 81 mg via ORAL
  Filled 2022-06-20 (×6): qty 1

## 2022-06-20 MED ORDER — FUROSEMIDE 10 MG/ML IJ SOLN
60.0000 mg | Freq: Once | INTRAMUSCULAR | Status: AC
  Administered 2022-06-20: 60 mg via INTRAVENOUS
  Filled 2022-06-20: qty 6

## 2022-06-20 MED ORDER — HYDRALAZINE HCL 20 MG/ML IJ SOLN
20.0000 mg | Freq: Four times a day (QID) | INTRAMUSCULAR | Status: DC | PRN
  Administered 2022-06-21: 20 mg via INTRAVENOUS
  Filled 2022-06-20: qty 1

## 2022-06-20 NOTE — Progress Notes (Signed)
Echocardiogram 2D Echocardiogram has been performed.  Troy Singh 06/20/2022, 10:55 AM

## 2022-06-20 NOTE — Progress Notes (Signed)
No bipap indicated at this time.  Pt resting stable and comfortable with no distress noted.

## 2022-06-20 NOTE — ED Provider Notes (Addendum)
Patient signed out me by Dr. Posey Rea for further monitoring and evaluation.  Patient presented to the emergency department with somewhat sudden onset of shortness of breath.  He was transported by EMS, patient found to be extremely hypoxic initially.  Patient with very high blood pressures, placed on CPAP for transport and BiPAP here in the emergency department.  Physical Exam  BP (!) 157/101   Pulse 77   Temp 97.7 F (36.5 C) (Axillary)   Resp (!) 27   Ht 5\' 3"  (1.6 m)   Wt 104.3 kg   SpO2 97%   BMI 40.73 kg/m   Physical Exam Vitals and nursing note reviewed.  Constitutional:      General: He is not in acute distress.    Appearance: He is well-developed.  HENT:     Head: Normocephalic and atraumatic.     Mouth/Throat:     Mouth: Mucous membranes are moist.  Eyes:     General: Vision grossly intact. Gaze aligned appropriately.     Extraocular Movements: Extraocular movements intact.     Conjunctiva/sclera: Conjunctivae normal.  Cardiovascular:     Rate and Rhythm: Normal rate and regular rhythm.     Pulses: Normal pulses.     Heart sounds: Normal heart sounds, S1 normal and S2 normal. No murmur heard.    No friction rub. No gallop.  Pulmonary:     Effort: Pulmonary effort is normal. No respiratory distress.     Breath sounds: Rales present.  Abdominal:     Palpations: Abdomen is soft.     Tenderness: There is no abdominal tenderness. There is no guarding or rebound.     Hernia: No hernia is present.  Musculoskeletal:        General: No swelling.     Cervical back: Full passive range of motion without pain, normal range of motion and neck supple. No pain with movement, spinous process tenderness or muscular tenderness. Normal range of motion.     Right lower leg: No edema.     Left lower leg: No edema.  Skin:    General: Skin is warm and dry.     Capillary Refill: Capillary refill takes less than 2 seconds.     Findings: No ecchymosis, erythema, lesion or wound.   Neurological:     Mental Status: He is alert and oriented to person, place, and time.     GCS: GCS eye subscore is 4. GCS verbal subscore is 5. GCS motor subscore is 6.     Cranial Nerves: Cranial nerves 2-12 are intact.     Sensory: Sensation is intact.     Motor: Motor function is intact. No weakness or abnormal muscle tone.     Coordination: Coordination is intact.  Psychiatric:        Mood and Affect: Mood normal.        Speech: Speech normal.        Behavior: Behavior normal.     Procedures  .Critical Care  Performed by: , MD Authorized by: Gilda Crease, MD   Critical care provider statement:    Critical care time (minutes):  30   Critical care was necessary to treat or prevent imminent or life-threatening deterioration of the following conditions:  Respiratory failure   Critical care was time spent personally by me on the following activities:  Development of treatment plan with patient or surrogate, discussions with consultants, evaluation of patient's response to treatment, examination of patient, ordering and review of  laboratory studies, ordering and review of radiographic studies, ordering and performing treatments and interventions, pulse oximetry, re-evaluation of patient's condition and review of old charts   I assumed direction of critical care for this patient from another provider in my specialty: yes     Care discussed with: admitting provider     ED Course / MDM    Medical Decision Making Amount and/or Complexity of Data Reviewed External Data Reviewed: ECG and notes. Labs: ordered. Radiology: ordered and independent interpretation performed. Decision-making details documented in ED Course. ECG/medicine tests: ordered and independent interpretation performed. Decision-making details documented in ED Course.  Risk Prescription drug management. Decision regarding hospitalization.   CT angiography reviewed.  No evidence of PE.   Patient with bilateral groundglass opacities.  Presentation is most consistent with hypertensive emergency with flash pulmonary edema.  He was covered for mildly as well.  Patient given IV Lasix and has initiated diuresis.  First troponin was borderline at 28.  Second troponin 600, third troponin 1900.  This is felt to be demand ischemia secondary to hypertensive urgency.  Discussed with Dr. Rudi Rummage, on-call for cardiology.  Feels that it is reasonable to initiate heparin until further data is collected, however unlikely to need a heart catheterization.  Patient okay to stay at [facility] long.  Continue blood pressure control, obtain echo in the morning.  Blood pressure initially improved with BiPAP as he was no longer in respiratory distress.  Over time, blood pressure is slowly increasing, will initiate IV nitroglycerin to help with pressure and preload.  Addendum: Discussed with Dr. Marlowe Sax, on-call for hospitalist service.  She has reviewed the data and has requested critical care consultation due to significant CT findings.  Discussed with Dr. Valeta Harms, on-call for critical care.  Critical care will see the patient in the ED and help make disposition decisions.     Orpah Greek, MD 06/20/22 0335    Orpah Greek, MD 06/20/22 [id number]    Orpah Greek, MD 06/20/22 [phone]
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
## 2022-06-20 NOTE — Progress Notes (Addendum)
PCCM Progress Note  Doing well off BiPAP this morning. Prefers to breathe through his mouth due to nasal congestion.  Saturation 87-92% on 5L [place] mostly breathing through his mouth Normal WOB, equal chest rise, ctab RRR LE warm not edematous  Trop up to 4800   F/u response to hydralazine/norvasc, wean nitro for SBP 160, holding diuretic, cardiology plans LHC in the next day or so. Can try O2 by facemask if O2 remains marginal, resume BiPAP if increased WOB.   Laroy [facility] Pulmonary/Critical Care

## 2022-06-20 NOTE — Progress Notes (Signed)
ANTICOAGULATION CONSULT NOTE - Initial Consult  Pharmacy Consult for Heparin Indication: chest pain/ACS  No Known Allergies  Patient Measurements: Height: 5\' 3"  (160 cm) Weight: 104.3 kg (229 lb 15 oz) IBW/kg (Calculated) : 56.9 Heparin Dosing Weight: 80kg   Vital Signs: Temp: 97.7 F (36.5 C) (12/06 2126) Temp Source: Axillary (12/06 2126) BP: 157/101 (12/07 0300) Pulse Rate: 77 (12/07 0300)  Labs: Recent Labs    06/19/22 2128 06/19/22 2135 06/20/22 0005 06/20/22 0150  HGB 12.6* 14.3  --   --   HCT 42.0 42.0  --   --   PLT 336  --   --   --   CREATININE  --  1.70* 1.67* 1.56*  TROPONINIHS 28*  --   --  1,937*    Estimated Creatinine Clearance: 59.5 mL/min (A) (by C-G formula based on SCr of 1.56 mg/dL (H)).   Medical History: Past Medical History:  Diagnosis Date   Diabetes mellitus without complication (HCC)    Gout    Hypertension     Medications:  Infusions:   nitroGLYCERIN      Assessment: 29 yoM presents with hypertensive urgency.  Troponin increasing 28>>1937.  Per cardiology, likely demand ischemia however recommend to start IV heparin while work-up ongoing.  Not on anticoagulation PTA.  Baseline CBC-  Hg, pltc WNL.     Goal of Therapy:  Heparin level 0.3-0.7 units/ml Monitor platelets by anticoagulation protocol: Yes   Plan:  Heparin 4000 units IV bolus x1 Heparin infusion at 1000 units/hr Check 6h heparin level after infusion starts Daily heparin level & CBC while on heparin  44 PharmD 06/20/2022,3:32 AM

## 2022-06-20 NOTE — Progress Notes (Signed)
ANTICOAGULATION CONSULT NOTE - Follow Up Consult  Pharmacy Consult for Heparin  Indication: chest pain/ACS  No Known Allergies  Patient Measurements: Height: 5\' 3"  (160 cm) Weight: 104.3 kg (229 lb 15 oz) IBW/kg (Calculated) : 56.9 Heparin Dosing Weight: 80 kg  Vital Signs: Temp: 99.1 F (37.3 C) (12/07 1151) Temp Source: Oral (12/07 1151) BP: 166/96 (12/07 1030) Pulse Rate: 76 (12/07 1030)  Labs: Recent Labs    06/19/22 2128 06/19/22 2135 06/19/22 2135 06/20/22 0005 06/20/22 0150 06/20/22 0840 06/20/22 0842 06/20/22 1000  HGB 12.6* 14.3  --   --   --   --   --   --   HCT 42.0 42.0  --   --   --   --   --   --   PLT 336  --   --   --   --   --   --   --   CREATININE  --  1.70*   < > 1.67* 1.56* 1.42*  --   --   TROPONINIHS 28*  --   --   --  1,937*  --  4,928* 4,835*   < > = values in this interval not displayed.    Estimated Creatinine Clearance: 65.3 mL/min (A) (by C-G formula based on SCr of 1.42 mg/dL (H)).   Medications:  Infusions:   heparin 1,000 Units/hr (06/20/22 0942)   nitroGLYCERIN 30 mcg/min (06/20/22 14/07/23)    Assessment: 39 yoM presents with hypertensive urgency.  Troponin increasing 28>>1937.  Per cardiology, likely demand ischemia however recommend to start IV heparin while work-up ongoing.  Not on anticoagulation PTA.   Baseline CBC-  Hg, pltc WNL.   Today, 06/20/2022:  Heparin level < 0.1, subtherapeutic on heparin at 1000 units/hr CBC (last on 12/6): Hgb 12.6, Plt 336 No bleeding or complications reported.  RN reports no IV problems, pauses, or interruptions.    Goal of Therapy:  Heparin level 0.3-0.7 units/ml Monitor platelets by anticoagulation protocol: Yes   Plan:  Give heparin 2500 units bolus IV x 1 Increase to heparin IV infusion at 1300 units/hr Heparin level 6 hours after rate change Daily heparin level and CBC   14/6 PharmD, BCPS [facility] main pharmacy [phone] 06/20/2022 12:13 PM

## 2022-06-20 NOTE — Progress Notes (Signed)
ANTICOAGULATION CONSULT NOTE - Follow Up Consult  Pharmacy Consult for Heparin  Indication: chest pain/ACS  No Known Allergies  Patient Measurements: Height: 5\' 3"  (160 cm) Weight: 104.3 kg (229 lb 15 oz) IBW/kg (Calculated) : 56.9 Heparin Dosing Weight: 80 kg  Vital Signs: Temp: 99.2 F (37.3 C) (12/07 2333) Temp Source: Oral (12/07 2333) BP: 160/82 (12/07 2200) Pulse Rate: 80 (12/07 2200)  Labs: Recent Labs    06/19/22 2128 06/19/22 2135 06/19/22 2135 06/20/22 0005 06/20/22 0150 06/20/22 0840 06/20/22 0842 06/20/22 1000 06/20/22 1321 06/20/22 1846 06/20/22 2145  HGB 12.6* 14.3  --   --   --   --   --   --   --   --   --   HCT 42.0 42.0  --   --   --   --   --   --   --   --   --   PLT 336  --   --   --   --   --   --   --   --   --   --   HEPARINUNFRC  --   --   --   --   --   --   --   --  <0.10*  --  0.13*  CREATININE  --  1.70*   < > 1.67* 1.56* 1.42*  --   --   --   --   --   TROPONINIHS 28*  --   --   --  1,937*  --  [id number]*  --  * 6,378*  --    < > = values in this interval not displayed.     Estimated Creatinine Clearance: 65.3 mL/min (A) (by C-G formula based on SCr of 1.42 mg/dL (H)).   Medications:  Infusions:   [START ON 06/21/2022] azithromycin (ZITHROMAX) 500 mg in sodium chloride 0.9 % 250 mL IVPB     [START ON 06/21/2022] cefTRIAXone (ROCEPHIN)  IV 2 g (06/20/22 2317)   heparin 1,300 Units/hr (06/20/22 1811)   nitroGLYCERIN Stopped (06/20/22 1446)    Assessment: 84 yoM presents with hypertensive urgency.  Troponin increasing 28>>1937.  Per cardiology, likely demand ischemia however recommend to start IV heparin while work-up ongoing.  Not on anticoagulation PTA.   Baseline CBC-  Hg, pltc WNL.   Today, 06/20/2022:  Heparin level 0.13, subtherapeutic on heparin at 1300 units/hr CBC (last on 12/6): Hgb 12.6, Plt 336 No bleeding or complications reported.  RN reports no IV problems, pauses, or interruptions.    Goal of Therapy:  Heparin  level 0.3-0.7 units/ml Monitor platelets by anticoagulation protocol: Yes   Plan:  Give heparin 2500 units bolus IV x 1 Increase to heparin IV infusion at 1500 units/hr Heparin level 6 hours after rate change Daily heparin level and CBC  14/6, PharmD, BCPS 06/20/2022 11:46 PM

## 2022-06-20 NOTE — Progress Notes (Signed)
Rt took pt off BIPAP and placed on 2 LPM . No distress noted at this time.  

## 2022-06-20 NOTE — H&P (Addendum)
NAME:  Troy Singh, MRN:  [id number], DOB:  08/25/1970, LOS: 0 ADMISSION DATE:  06/19/2022, CONSULTATION DATE: 06/20/2022 REFERRING MD: Dr. Betsey Holiday, CHIEF COMPLAINT: Hypertension on BiPAP  History of Present Illness:  This is a 51 year old gentleman, past medical history of diabetes and hypertension, obesity, BMI of 40.  Presented to the emergency department shortness of breath.  Patient was found to be significantly hypoxemic and was placed on BiPAP and transport.  He had acute onset shortness of breath.  In the ER he was found to haveCT imaging with bilateral groundglass opacities concerning for pulmonary edema.  Patient currently takes amlodipine and hydrochlorothiazide at home for his blood pressure.  Patient has improved after he was placed on IV nitroglycerin as well as BiPAP.  He received 20 mg of IV Lasix.  No recent echocardiogram on file.  Pertinent  Medical History   Past Medical History:  Diagnosis Date   Diabetes mellitus without complication ([place])    Gout    Hypertension      Significant Hospital Events: Including procedures, antibiotic start and stop dates in addition to other pertinent events     Interim History / Subjective:  Per HPI above  Objective   Blood pressure (!) 170/110, pulse 74, temperature 98.5 F (36.9 C), temperature source Axillary, resp. rate (!) 25, height 5\' 3"  (1.6 m), weight 104.3 kg, SpO2 97 %.    FiO2 (%):  [40 %-100 %] 40 %   Intake/Output Summary (Last 24 hours) at 06/20/2022 [id number] Last data filed at 06/20/2022 [id number] Gross per 24 hour  Intake 350.62 ml  Output --  Net 350.62 ml   Filed Weights   06/19/22 2122  Weight: 104.3 kg    Examination: General: Obese gentleman, resting comfortably on BiPAP therapy HENT: NCAT, trachea midline Lungs: Crackles bilaterally, bilateral ventilated breaths on BiPAP Cardiovascular: Regular rhythm, S1-S2, Abdomen: Obese soft nontender nondistended Extremities: Trace lower extremity edema Neuro: Alert  oriented following commands no focal deficit moves all 4 extremities GU: Deferred  Resolved Hospital Problem list     Assessment & Plan:   Hypertensive urgency Endorgan damage with respiratory failure and elevated troponin Elevated troponin, possible ACS versus supply demand mismatch Acute hypoxemic respiratory failure secondary to bilateral pulmonary edema requiring BiPAP. Obesity, BMI 40 Elevated serum creatinine, AKI  Plan: Additional 60 mg IV Lasix x 1 Follow I's and O's closely, repeat electrolytes, goal potassium greater than 4, goal magnesium greater than 2 Wean from IV nitroglycerin Start home oral amlodipine, as needed hydralazine Continue heparin, ACS protocol Patient was given antibiotics in the ER for possible pneumonia, I do not think he has pneumonia the CT imaging and clinical picture is most consistent with pulmonary edema.  Observe for any other infectious symptoms or fever, hold antibiotics at this time.  Check procalcitonin. We can restart if needed  Cardiology to see in a.m. Echocardiogram ordered RVP pending, strep and legionella ag pending    Best Practice (right click and "Reselect all SmartList Selections" daily)   Diet/type: NPO DVT prophylaxis: systemic heparin GI prophylaxis: N/A Lines: N/A Foley:  N/A Code Status:  full code Last date of multidisciplinary goals of care discussion [discussed with patient]  Labs   CBC: Recent Labs  Lab 06/19/22 2128 06/19/22 2135  WBC 15.7*  --   NEUTROABS 13.4*  --   HGB 12.6* 14.3  HCT 42.0 42.0  MCV 89.7  --   PLT 336  --     Basic Metabolic Panel: Recent Labs  Lab 06/19/22 2135 06/20/22 0005 06/20/22 0150  NA 139 137 140  K 4.5 3.8 4.5  CL 107 106 109  CO2  --  26 23  GLUCOSE 255* 87 96  BUN 34* 25* 25*  CREATININE 1.70* 1.67* 1.56*  CALCIUM  --  8.8* 9.0   GFR: Estimated Creatinine Clearance: 59.5 mL/min (A) (by C-G formula based on SCr of 1.56 mg/dL (H)). Recent Labs  Lab  06/19/22 2128  WBC 15.7*    Liver Function Tests: Recent Labs  Lab 06/20/22 0005 06/20/22 0150  AST 43* 58*  ALT 18 20  ALKPHOS 60 60  BILITOT 0.4 0.6  PROT 7.3 7.4  ALBUMIN 3.1* 3.3*   No results for input(s): "LIPASE", "AMYLASE" in the last 168 hours. No results for input(s): "AMMONIA" in the last 168 hours.  ABG    Component Value Date/Time   HCO3 22.0 06/19/2022 2128   TCO2 20 (L) 06/19/2022 2135   ACIDBASEDEF 5.3 (H) 06/19/2022 2128   O2SAT 32.4 06/19/2022 2128     Coagulation Profile: No results for input(s): "INR", "PROTIME" in the last 168 hours.  Cardiac Enzymes: No results for input(s): "CKTOTAL", "CKMB", "CKMBINDEX", "TROPONINI" in the last 168 hours.  HbA1C: No results found for: "HGBA1C"  CBG: No results for input(s): "GLUCAP" in the last 168 hours.  Review of Systems:   Review of Systems  Constitutional:  Negative for chills, fever, malaise/fatigue and weight loss.  HENT:  Negative for hearing loss, sore throat and tinnitus.   Eyes:  Negative for blurred vision and double vision.  Respiratory:  Positive for shortness of breath. Negative for cough, hemoptysis, sputum production, wheezing and stridor.   Cardiovascular:  Positive for chest pain. Negative for palpitations, orthopnea, leg swelling and PND.  Gastrointestinal:  Negative for abdominal pain, constipation, diarrhea, heartburn, nausea and vomiting.  Genitourinary:  Negative for dysuria, hematuria and urgency.  Musculoskeletal:  Negative for joint pain and myalgias.  Skin:  Negative for itching and rash.  Neurological:  Negative for dizziness, tingling, weakness and headaches.  Endo/Heme/Allergies:  Negative for environmental allergies. Does not bruise/bleed easily.  Psychiatric/Behavioral:  Negative for depression. The patient is not nervous/anxious and does not have insomnia.   All other systems reviewed and are negative.    Past Medical History:  He,  has a past medical history of  Diabetes mellitus without complication (HCC), Gout, and Hypertension.   Surgical History:   Past Surgical History:  Procedure Laterality Date   PENECTOMY       Social History:   reports that he has been smoking cigarettes. He has been smoking an average of 1 pack per day. He has never used smokeless tobacco. He reports that he does not drink alcohol and does not use drugs.   Family History:  His family history includes Diabetes in an other family member; Hypertension in an other family member.   Allergies No Known Allergies   Home Medications  Prior to Admission medications   Medication Sig Start Date End Date Taking? Authorizing Provider  amLODipine (NORVASC) 2.5 MG tablet Take 1 tablet (2.5 mg total) by mouth daily. 04/29/19   Rise Mu, PA-C  hydrochlorothiazide (HYDRODIURIL) 25 MG tablet Take 1 tablet (25 mg total) by mouth daily. Patient not taking: Reported on 03/21/2017 11/14/14   Gwyneth Sprout, MD  HYDROcodone-acetaminophen (NORCO/VICODIN) 5-325 MG tablet Take 2 tablets by mouth every 4 (four) hours as needed. 10/03/19   Henderly, Britni A, PA-C  indomethacin (INDOCIN) 25 MG capsule  Take 1 capsule (25 mg total) by mouth 3 (three) times daily as needed. 10/03/19   Henderly, Britni A, PA-C  levETIRAcetam (KEPPRA) 500 MG tablet Take 1 tablet (500 mg total) by mouth 2 (two) times daily. 10/22/21   Lucrezia Starch, MD      This patient is critically ill with multiple organ system failure; which, requires frequent high complexity decision making, assessment, support, evaluation, and titration of therapies. This was completed through the application of advanced monitoring technologies and extensive interpretation of multiple databases. During this encounter critical care time was devoted to patient care services described in this note for 34 minutes.  [facility] 06/20/2022 6:17 AM

## 2022-06-21 ENCOUNTER — Encounter (HOSPITAL_COMMUNITY): Payer: Self-pay | Admitting: Cardiovascular Disease

## 2022-06-21 ENCOUNTER — Encounter (HOSPITAL_COMMUNITY)
Admission: EM | Disposition: A | Discharge status: Home / Self Care | Payer: Self-pay | Source: Home / Self Care | Attending: Student

## 2022-06-21 DIAGNOSIS — I420 Dilated cardiomyopathy: Secondary | ICD-10-CM

## 2022-06-21 DIAGNOSIS — I428 Other cardiomyopathies: Secondary | ICD-10-CM

## 2022-06-21 HISTORY — PX: RIGHT/LEFT HEART CATH AND CORONARY ANGIOGRAPHY: CATH118266

## 2022-06-21 LAB — CBC
HCT: 36 % — ABNORMAL LOW (ref 39.0–52.0)
HCT: 37.1 % — ABNORMAL LOW (ref 39.0–52.0)
Hemoglobin: 11.2 g/dL — ABNORMAL LOW (ref 13.0–17.0)
Hemoglobin: 11.3 g/dL — ABNORMAL LOW (ref 13.0–17.0)
MCH: 26.1 pg (ref 26.0–34.0)
MCH: 26.4 pg (ref 26.0–34.0)
MCHC: 30.5 g/dL (ref 30.0–36.0)
MCHC: 31.1 g/dL (ref 30.0–36.0)
MCV: 84.7 fL (ref 80.0–100.0)
MCV: 85.7 fL (ref 80.0–100.0)
Platelets: 269 10*3/uL (ref 150–400)
Platelets: 272 10*3/uL (ref 150–400)
RBC: 4.25 MIL/uL (ref 4.22–5.81)
RBC: 4.33 MIL/uL (ref 4.22–5.81)
RDW: 15.8 % — ABNORMAL HIGH (ref 11.5–15.5)
RDW: 15.8 % — ABNORMAL HIGH (ref 11.5–15.5)
WBC: 10.7 10*3/uL — ABNORMAL HIGH (ref 4.0–10.5)
WBC: 9.9 10*3/uL (ref 4.0–10.5)
nRBC: 0 % (ref 0.0–0.2)
nRBC: 0 % (ref 0.0–0.2)

## 2022-06-21 LAB — BASIC METABOLIC PANEL
Anion gap: 10 (ref 5–15)
Anion gap: 12 (ref 5–15)
Anion gap: 11 (ref 5–15)
BUN: 26 mg/dL — ABNORMAL HIGH (ref 6–20)
BUN: 24 mg/dL — ABNORMAL HIGH (ref 6–20)
BUN: 29 mg/dL — ABNORMAL HIGH (ref 6–20)
CO2: 23 mmol/L (ref 22–32)
CO2: 22 mmol/L (ref 22–32)
CO2: 24 mmol/L (ref 22–32)
Calcium: 8.5 mg/dL — ABNORMAL LOW (ref 8.9–10.3)
Calcium: 8.5 mg/dL — ABNORMAL LOW (ref 8.9–10.3)
Calcium: 8.8 mg/dL — ABNORMAL LOW (ref 8.9–10.3)
Chloride: 103 mmol/L (ref 98–111)
Chloride: 100 mmol/L (ref 98–111)
Chloride: 103 mmol/L (ref 98–111)
Creatinine, Ser: 1.71 mg/dL — ABNORMAL HIGH (ref 0.61–1.24)
Creatinine, Ser: 1.55 mg/dL — ABNORMAL HIGH (ref 0.61–1.24)
Creatinine, Ser: 1.63 mg/dL — ABNORMAL HIGH (ref 0.61–1.24)
GFR, Estimated: 48 mL/min — ABNORMAL LOW (ref >60)
GFR, Estimated: 54 mL/min — ABNORMAL LOW (ref >60)
GFR, Estimated: 50 mL/min — ABNORMAL LOW (ref >60)
Glucose, Bld: 116 mg/dL — ABNORMAL HIGH (ref 70–99)
Glucose, Bld: 111 mg/dL — ABNORMAL HIGH (ref 70–99)
Glucose, Bld: 114 mg/dL — ABNORMAL HIGH (ref 70–99)
Potassium: 3.8 mmol/L (ref 3.5–5.1)
Potassium: 3.9 mmol/L (ref 3.5–5.1)
Potassium: 4.2 mmol/L (ref 3.5–5.1)
Sodium: 136 mmol/L (ref 135–145)
Sodium: 134 mmol/L — ABNORMAL LOW (ref 135–145)
Sodium: 138 mmol/L (ref 135–145)

## 2022-06-21 LAB — POCT I-STAT 7, (LYTES, BLD GAS, ICA,H+H)
Acid-Base Excess: 0 mmol/L (ref 0.0–2.0)
Bicarbonate: 24 mmol/L (ref 20.0–28.0)
Calcium, Ion: 1.11 mmol/L — ABNORMAL LOW (ref 1.15–1.40)
HCT: 34 % — ABNORMAL LOW (ref 39.0–52.0)
Hemoglobin: 11.6 g/dL — ABNORMAL LOW (ref 13.0–17.0)
O2 Saturation: 94 %
Potassium: 3.9 mmol/L (ref 3.5–5.1)
Sodium: 137 mmol/L (ref 135–145)
TCO2: 25 mmol/L (ref 22–32)
pCO2 arterial: 36.5 mmHg (ref 32–48)
pH, Arterial: 7.425 (ref 7.35–7.45)
pO2, Arterial: 68 mmHg — ABNORMAL LOW (ref 83–108)

## 2022-06-21 LAB — PROCALCITONIN: Procalcitonin: 3.18 ng/mL

## 2022-06-21 LAB — POCT I-STAT EG7
Acid-Base Excess: 1 mmol/L (ref 0.0–2.0)
Bicarbonate: 25.9 mmol/L (ref 20.0–28.0)
Calcium, Ion: 1.15 mmol/L (ref 1.15–1.40)
HCT: 35 % — ABNORMAL LOW (ref 39.0–52.0)
Hemoglobin: 11.9 g/dL — ABNORMAL LOW (ref 13.0–17.0)
O2 Saturation: 63 %
Potassium: 4 mmol/L (ref 3.5–5.1)
Sodium: 139 mmol/L (ref 135–145)
TCO2: 27 mmol/L (ref 22–32)
pCO2, Ven: 42 mmHg — ABNORMAL LOW (ref 44–60)
pH, Ven: 7.398 (ref 7.25–7.43)
pO2, Ven: 33 mmHg (ref 32–45)

## 2022-06-21 LAB — HEPARIN LEVEL (UNFRACTIONATED): Heparin Unfractionated: 0.23 IU/mL — ABNORMAL LOW (ref 0.30–0.70)

## 2022-06-21 LAB — PHOSPHORUS: Phosphorus: 3.3 mg/dL (ref 2.5–4.6)

## 2022-06-21 LAB — LEGIONELLA PNEUMOPHILA SEROGP 1 UR AG: L. pneumophila Serogp 1 Ur Ag: NEGATIVE

## 2022-06-21 LAB — MAGNESIUM
Magnesium: 1.9 mg/dL (ref 1.7–2.4)
Magnesium: 2.5 mg/dL — ABNORMAL HIGH (ref 1.7–2.4)

## 2022-06-21 SURGERY — RIGHT/LEFT HEART CATH AND CORONARY ANGIOGRAPHY
Anesthesia: LOCAL

## 2022-06-21 MED ORDER — LIDOCAINE HCL (PF) 1 % IJ SOLN
INTRAMUSCULAR | Status: DC | PRN
  Administered 2022-06-21 (×2): 2 mL

## 2022-06-21 MED ORDER — SODIUM CHLORIDE 0.9% FLUSH: 3.0000 mL | INTRAVENOUS | Status: DC | PRN

## 2022-06-21 MED ORDER — FENTANYL CITRATE (PF) 100 MCG/2ML IJ SOLN
INTRAMUSCULAR | Status: AC
  Filled 2022-06-21: qty 2

## 2022-06-21 MED ORDER — IOHEXOL 350 MG/ML SOLN
INTRAVENOUS | Status: DC | PRN
  Administered 2022-06-21: 60 mL

## 2022-06-21 MED ORDER — SODIUM CHLORIDE 0.9 % IV SOLN: INTRAVENOUS | Status: DC

## 2022-06-21 MED ORDER — LIDOCAINE HCL (PF) 1 % IJ SOLN
INTRAMUSCULAR | Status: AC
  Filled 2022-06-21: qty 30

## 2022-06-21 MED ORDER — SODIUM CHLORIDE 0.9 % IV SOLN: 250.0000 mL | INTRAVENOUS | Status: DC | PRN

## 2022-06-21 MED ORDER — CARVEDILOL 3.125 MG PO TABS
3.1250 mg | ORAL_TABLET | Freq: Two times a day (BID) | ORAL | Status: DC
  Administered 2022-06-21 – 2022-06-26 (×10): 3.125 mg via ORAL
  Filled 2022-06-21 (×10): qty 1

## 2022-06-21 MED ORDER — HEPARIN (PORCINE) IN NACL 1000-0.9 UT/500ML-% IV SOLN
INTRAVENOUS | Status: DC | PRN
  Administered 2022-06-21 (×2): 500 mL

## 2022-06-21 MED ORDER — VERAPAMIL HCL 2.5 MG/ML IV SOLN
INTRAVENOUS | Status: AC
  Filled 2022-06-21: qty 2

## 2022-06-21 MED ORDER — FUROSEMIDE 10 MG/ML IJ SOLN
40.0000 mg | Freq: Two times a day (BID) | INTRAMUSCULAR | Status: DC
  Administered 2022-06-21 – 2022-06-26 (×11): 40 mg via INTRAVENOUS
  Filled 2022-06-21 (×12): qty 4

## 2022-06-21 MED ORDER — FENTANYL CITRATE (PF) 100 MCG/2ML IJ SOLN
INTRAMUSCULAR | Status: DC | PRN
  Administered 2022-06-21: 50 ug via INTRAVENOUS

## 2022-06-21 MED ORDER — SODIUM CHLORIDE 0.9% FLUSH
3.0000 mL | Freq: Two times a day (BID) | INTRAVENOUS | Status: DC
  Administered 2022-06-21 – 2022-06-26 (×8): 3 mL via INTRAVENOUS

## 2022-06-21 MED ORDER — HEPARIN SODIUM (PORCINE) 1000 UNIT/ML IJ SOLN
INTRAMUSCULAR | Status: DC | PRN
  Administered 2022-06-21: 5000 [IU] via INTRAVENOUS

## 2022-06-21 MED ORDER — SODIUM CHLORIDE 0.9 % IV SOLN: INTRAVENOUS | Status: AC

## 2022-06-21 MED ORDER — MAGNESIUM SULFATE 2 GM/50ML IV SOLN
2.0000 g | Freq: Once | INTRAVENOUS | Status: AC
  Administered 2022-06-21: 2 g via INTRAVENOUS
  Filled 2022-06-21: qty 50

## 2022-06-21 MED ORDER — HEPARIN SODIUM (PORCINE) 1000 UNIT/ML IJ SOLN
INTRAMUSCULAR | Status: AC
  Filled 2022-06-21: qty 10

## 2022-06-21 MED ORDER — LABETALOL HCL 5 MG/ML IV SOLN
10.0000 mg | INTRAVENOUS | Status: AC | PRN
  Administered 2022-06-21 (×2): 10 mg via INTRAVENOUS

## 2022-06-21 MED ORDER — MIDAZOLAM HCL 2 MG/2ML IJ SOLN
INTRAMUSCULAR | Status: AC
  Filled 2022-06-21: qty 2

## 2022-06-21 MED ORDER — MIDAZOLAM HCL 2 MG/2ML IJ SOLN
INTRAMUSCULAR | Status: DC | PRN
  Administered 2022-06-21: 2 mg via INTRAVENOUS

## 2022-06-21 MED ORDER — HYDRALAZINE HCL 20 MG/ML IJ SOLN: 10.0000 mg | INTRAMUSCULAR | Status: AC | PRN

## 2022-06-21 MED ORDER — HEPARIN (PORCINE) IN NACL 1000-0.9 UT/500ML-% IV SOLN
INTRAVENOUS | Status: AC
  Filled 2022-06-21: qty 1000

## 2022-06-21 MED ORDER — LABETALOL HCL 5 MG/ML IV SOLN
INTRAVENOUS | Status: AC
  Filled 2022-06-21: qty 4

## 2022-06-21 MED ORDER — AZITHROMYCIN 250 MG PO TABS
500.0000 mg | ORAL_TABLET | Freq: Every day | ORAL | Status: AC
  Administered 2022-06-21 – 2022-06-23 (×3): 500 mg via ORAL
  Filled 2022-06-21 (×3): qty 2

## 2022-06-21 MED ORDER — VERAPAMIL HCL 2.5 MG/ML IV SOLN
INTRAVENOUS | Status: DC | PRN
  Administered 2022-06-21: 10 mL via INTRA_ARTERIAL

## 2022-06-21 MED ORDER — HEPARIN BOLUS VIA INFUSION
1500.0000 [IU] | INTRAVENOUS | Status: AC
  Administered 2022-06-21: 1500 [IU] via INTRAVENOUS
  Filled 2022-06-21: qty 1500

## 2022-06-21 SURGICAL SUPPLY — 11 items
CATH 5FR JL3.5 JR4 ANG PIG MP (CATHETERS) IMPLANT
CATH SWAN GANZ 7F STRAIGHT (CATHETERS) IMPLANT
GLIDESHEATH SLEND SS 6F .021 (SHEATH) IMPLANT
GLIDESHEATH SLENDER 7FR .021G (SHEATH) IMPLANT
GUIDEWIRE INQWIRE 1.5J.035X260 (WIRE) IMPLANT
INQWIRE 1.5J .035X260CM (WIRE) ×1
KIT HEART LEFT (KITS) ×2 IMPLANT
MAT PREVALON FULL STRYKER (MISCELLANEOUS) IMPLANT
PACK CARDIAC CATHETERIZATION (CUSTOM PROCEDURE TRAY) ×2 IMPLANT
TRANSDUCER W/STOPCOCK (MISCELLANEOUS) ×2 IMPLANT
TUBING CIL FLEX 10 FLL-RA (TUBING) ×2 IMPLANT

## 2022-06-21 NOTE — Progress Notes (Signed)
Pt is resting well at this time. No need of bipap. Machine remained bedside if need arise. RN aware.

## 2022-06-21 NOTE — Progress Notes (Signed)
eLink Physician-Brief Progress Note Patient Name: Troy Singh DOB: June 17, 1971 MRN: [id number]   Date of Service  06/21/2022  HPI/Events of Note  16 beat run of NSVT. Cardiac Cath planned in AM. AM labs including BMP and Mg++ sent now.   eICU Interventions  Plan: Await results of K+ and Mg++.     Intervention Category Major Interventions: Arrhythmia - evaluation and management  Marcelle Bebout Dennard Nip 06/21/2022, 12:35 AM

## 2022-06-21 NOTE — Progress Notes (Signed)
[facility] ADULT ICU REPLACEMENT PROTOCOL   The patient does apply for the [facility] Adult ICU Electrolyte Replacment Protocol based on the criteria listed below:   1.Exclusion criteria: TCTS, ECMO, Dialysis, and Myasthenia Gravis patients 2. Is GFR >/= 30 ml/min? Yes.    Patient's GFR today is 48 3. Is SCr </= 2? Yes.   Patient's SCr is 1.71 mg/dL 4. Did SCr increase >/= 0.5 in 24 hours? No. 5.Pt's weight >40kg  Yes.   6. Abnormal electrolyte(s): Mag 1.9  7. Electrolytes replaced per protocol 8.  Call MD STAT for K+ </= 2.5, Phos </= 1, or Mag </= 1 Physician:  Dewaine Conger D Ouedraogo 06/21/2022 1:40 AM

## 2022-06-21 NOTE — Interval H&P Note (Signed)
History and Physical Interval Note:  06/21/2022 9:14 AM  Troy Singh  has presented today for surgery, with the diagnosis of nstemi.  The various methods of treatment have been discussed with the patient and family. After consideration of risks, benefits and other options for treatment, the patient has consented to  Procedure(s): RIGHT/LEFT HEART CATH AND CORONARY ANGIOGRAPHY (N/A) as a surgical intervention.  The patient's history has been reviewed, patient examined, no change in status, stable for surgery.  I have reviewed the patient's chart and labs.  Questions were answered to the patient's satisfaction.    Cath Lab Visit (complete for each Cath Lab visit)  Clinical Evaluation Leading to the Procedure:   ACS: Yes.    Non-ACS:    Anginal Classification: CCS III  Anti-ischemic medical therapy: Minimal Therapy (1 class of medications)  Non-Invasive Test Results: No non-invasive testing performed  Prior CABG: No previous CABG        Verne Carrow

## 2022-06-21 NOTE — Progress Notes (Signed)
Notified Elink nurse , Sheria Lang , RN of 16 beat run of vtach and HR up to 146 briefly. Now NSR 79. Alert/oriented and denies any abnormal symptoms. Telemetry nurse saved strip of this episode.

## 2022-06-21 NOTE — Progress Notes (Signed)
TR band removed at 1206.   No bleeding, no hematoma noted.   Pressure dressing applied and RUE kept elevated on chest.  Patient left resting in bed. Call bell in reach.

## 2022-06-21 NOTE — H&P (View-Only) (Signed)
Rounding Note    Patient Name: Troy Singh Date of Encounter: 06/21/2022  [place] Cardiologist: None   Subjective   Patient reports feeling well this AM. Breathing has improved. Denies chest pain, palpitations. Ready for cath today   Inpatient Medications    Scheduled Meds:  amLODipine  5 mg Oral Daily   aspirin EC  81 mg Oral Daily   atorvastatin  40 mg Oral Daily   Chlorhexidine Gluconate Cloth  6 each Topical Daily   hydrALAZINE  10 mg Oral Q8H   mouth rinse  15 mL Mouth Rinse 4 times per day   sodium chloride flush  3 mL Intravenous Q12H   Continuous Infusions:  sodium chloride     azithromycin (ZITHROMAX) 500 mg in sodium chloride 0.9 % 250 mL IVPB Stopped (06/21/22 0142)   cefTRIAXone (ROCEPHIN)  IV Stopped (06/20/22 2350)   heparin 1,700 Units/hr (06/21/22 0641)   nitroGLYCERIN Stopped (06/20/22 1446)   PRN Meds: docusate sodium, hydrALAZINE, mouth rinse, polyethylene glycol   Vital Signs    Vitals:   06/21/22 0400 06/21/22 0430 06/21/22 0530 06/21/22 0700  BP: (!) 158/80 (!) 156/81 (!) 140/79 (!) 151/86  Pulse: 76 81 62 71  Resp: (!) 24 (!) 28 (!) 24 (!) 23  Temp:      TempSrc:      SpO2: 95% 94% 90% 95%  Weight:      Height:        Intake/Output Summary (Last 24 hours) at 06/21/2022 0756 Last data filed at 06/21/2022 0306 Gross per 24 hour  Intake 1699.95 ml  Output 2820 ml  Net -1120.05 ml      06/19/2022    9:22 PM 06/14/2019    4:43 PM 04/29/2019    1:14 PM  Last 3 Weights  Weight (lbs) 229 lb 15 oz 230 lb 230 lb  Weight (kg) 104.3 kg 104.327 kg 104.327 kg      Telemetry    Normal Sinus Rhythm with PVCs - Personally Reviewed  ECG    Normal Sinus Rhythm, possible LVH, nonspecific IVCD - Personally Reviewed  Physical Exam   GEN: No acute distress. Laying in the bed with head elevated  Neck: No JVD Cardiac: RRR, no murmurs, rubs, or gallops.  Respiratory: Diminished breath sounds in bilateral lung bases, otherwise  clear to auscultation. Normal WOB on 5 L via Rudy  GI: Soft, nontender, non-distended  MS: No edema; No deformity. Neuro:  Nonfocal  Psych: Normal affect   Labs    High Sensitivity Troponin:   Recent Labs  Lab 06/19/22 2128 06/20/22 0150 06/20/22 0842 06/20/22 1000 06/20/22 1846  TROPONINIHS 28* 1,937* 4,928* 4,835* 4,637*     Chemistry Recent Labs  Lab 06/20/22 0005 06/20/22 0150 06/20/22 0840 06/20/22 0842 06/21/22 0043 06/21/22 0447  NA 137 140 141  --  136 134*  K 3.8 4.5 4.3  --  3.8 3.9  CL 106 109 106  --  103 100  CO2 26 23 24   --  23 22  GLUCOSE 87 96 103*  --  116* 111*  BUN 25* 25* 24*  --  26* 24*  CREATININE 1.67* 1.56* 1.42*  --  1.71* 1.55*  CALCIUM 8.8* 9.0 9.2  --  8.5* 8.5*  MG  --   --   --  2.2 1.9  --   PROT 7.3 7.4  --   --   --   --   ALBUMIN 3.1* 3.3*  --   --   --   --  AST 43* 58*  --   --   --   --   ALT 18 20  --   --   --   --   ALKPHOS 60 60  --   --   --   --   BILITOT 0.4 0.6  --   --   --   --   GFRNONAA 49* 53* 59*  --  48* 54*  ANIONGAP 5 8 11   --  10 12    Lipids No results for input(s): "CHOL", "TRIG", "HDL", "LABVLDL", "LDLCALC", "CHOLHDL" in the last 168 hours.  Hematology Recent Labs  Lab 06/19/22 2128 06/19/22 2135 06/21/22 0043 06/21/22 0447  WBC 15.7*  --  9.9 10.7*  RBC 4.68  --  4.25 4.33  HGB 12.6* 14.3 11.2* 11.3*  HCT 42.0 42.0 36.0* 37.1*  MCV 89.7  --  84.7 85.7  MCH 26.9  --  26.4 26.1  MCHC 30.0  --  31.1 30.5  RDW 15.8*  --  15.8* 15.8*  PLT 336  --  269 272   Thyroid  Recent Labs  Lab 06/20/22 0842  TSH 1.260    BNP Recent Labs  Lab 06/19/22 2123  BNP 459.4*    DDimer No results for input(s): "DDIMER" in the last 168 hours.   Radiology    ECHOCARDIOGRAM COMPLETE  Result Date: 06/20/2022    ECHOCARDIOGRAM REPORT   Patient Name:   VY HAYS Date of Exam: 06/20/2022 Medical Rec #:  [id number]     Height:       63.0 in Accession #:    [id number]    Weight:       229.9 lb Date of Birth:   [birth year]     BSA:          2.052 m Patient Age:    51 years      BP:           170/110 mmHg Patient Gender: M             HR:           77 bpm. Exam Location:  Inpatient Procedure: 2D Echo, Cardiac Doppler and Color Doppler Indications:    Elevated Troponin  History:        Patient has no prior history of Echocardiogram examinations.                 Risk Factors:Hypertension and Diabetes.  Sonographer:    Bernadene Person RDCS Referring Phys: [id number] [place]  1. Left ventricular ejection fraction, by estimation, is 30 to 35%. The left ventricle has moderately decreased function. The left ventricle demonstrates global hypokinesis. The left ventricular internal cavity size was moderately dilated. Left ventricular diastolic parameters are consistent with Grade III diastolic dysfunction (restrictive).  2. Right ventricular systolic function is normal. The right ventricular size is normal. Tricuspid regurgitation signal is inadequate for assessing PA pressure.  3. The mitral valve is normal in structure. No evidence of mitral valve regurgitation. No evidence of mitral stenosis.  4. The aortic valve is normal in structure. Aortic valve regurgitation is not visualized. No aortic stenosis is present.  5. The inferior vena cava is normal in size with <50% respiratory variability, suggesting right atrial pressure of 8 mmHg. Comparison(s): No prior Echocardiogram. FINDINGS  Left Ventricle: Left ventricular ejection fraction, by estimation, is 30 to 35%. The left ventricle has moderately decreased function. The left ventricle demonstrates global hypokinesis. The left ventricular internal cavity  size was moderately dilated. There is no left ventricular hypertrophy. Left ventricular diastolic parameters are consistent with Grade III diastolic dysfunction (restrictive). Right Ventricle: The right ventricular size is normal. No increase in right ventricular wall thickness. Right ventricular systolic function is  normal. Tricuspid regurgitation signal is inadequate for assessing PA pressure. Left Atrium: Left atrial size was normal in size. Right Atrium: Right atrial size was normal in size. Pericardium: There is no evidence of pericardial effusion. Mitral Valve: The mitral valve is normal in structure. No evidence of mitral valve regurgitation. No evidence of mitral valve stenosis. Tricuspid Valve: The tricuspid valve is normal in structure. Tricuspid valve regurgitation is not demonstrated. No evidence of tricuspid stenosis. Aortic Valve: The aortic valve is normal in structure. Aortic valve regurgitation is not visualized. No aortic stenosis is present. Pulmonic Valve: The pulmonic valve was normal in structure. Pulmonic valve regurgitation is not visualized. No evidence of pulmonic stenosis. Aorta: The aortic root is normal in size and structure. Venous: The inferior vena cava is normal in size with less than 50% respiratory variability, suggesting right atrial pressure of 8 mmHg. IAS/Shunts: No atrial level shunt detected by color flow Doppler.  LEFT VENTRICLE PLAX 2D LVIDd:         6.70 cm     Diastology LVIDs:         5.50 cm     LV e' medial:    4.50 cm/s LV PW:         1.00 cm     LV E/e' medial:  27.8 LV IVS:        0.80 cm     LV e' lateral:   4.05 cm/s LVOT diam:     2.30 cm     LV E/e' lateral: 30.9 LV SV:         65 LV SV Index:   32 LVOT Area:     4.15 cm  LV Volumes (MOD) LV vol d, MOD A2C: 87.0 ml LV vol d, MOD A4C: 89.3 ml LV vol s, MOD A2C: 56.0 ml LV vol s, MOD A4C: 57.6 ml LV SV MOD A2C:     31.0 ml LV SV MOD A4C:     89.3 ml LV SV MOD BP:      30.1 ml RIGHT VENTRICLE RV S prime:     10.10 cm/s TAPSE (M-mode): 2.4 cm LEFT ATRIUM             Index        RIGHT ATRIUM           Index LA diam:        4.60 cm 2.24 cm/m   RA Area:     16.00 cm LA Vol (A2C):   62.0 ml 30.22 ml/m  RA Volume:   45.20 ml  22.03 ml/m LA Vol (A4C):   58.3 ml 28.41 ml/m LA Biplane Vol: 64.7 ml 31.53 ml/m  AORTIC VALVE LVOT  Vmax:   99.40 cm/s LVOT Vmean:  61.100 cm/s LVOT VTI:    0.156 m  AORTA Ao Root diam: 3.40 cm Ao Asc diam:  3.50 cm MITRAL VALVE MV Area (PHT): 6.17 cm     SHUNTS MV Decel Time: 123 msec     Systemic VTI:  0.16 m MV E velocity: 125.00 cm/s  Systemic Diam: 2.30 cm MV A velocity: 45.40 cm/s MV E/A ratio:  2.75 Kardie Tobb DO Electronically signed by Thomasene Ripple DO Signature Date/Time: 06/20/2022/11:31:47 AM    Final  CT Angio Chest PE W and/or Wo Contrast  Result Date: 06/19/2022 CLINICAL DATA:  Respiratory distress EXAM: CT ANGIOGRAPHY CHEST WITH CONTRAST TECHNIQUE: Multidetector CT imaging of the chest was performed using the standard protocol during bolus administration of intravenous contrast. Multiplanar CT image reconstructions and MIPs were obtained to evaluate the vascular anatomy. RADIATION DOSE REDUCTION: This exam was performed according to the departmental dose-optimization program which includes automated exposure control, adjustment of the mA and/or kV according to patient size and/or use of iterative reconstruction technique. CONTRAST:  60 mL OMNIPAQUE IOHEXOL 350 MG/ML SOLN COMPARISON:  Chest x-ray 06/19/2022 FINDINGS: Cardiovascular: Satisfactory opacification of the pulmonary arteries to the segmental level. No evidence of pulmonary embolism. Nonaneurysmal aorta. Mild atherosclerosis. Cardiomegaly. No pericardial effusion. Mediastinum/Nodes: Midline trachea. No thyroid mass. Multiple mildly enlarged mediastinal lymph nodes. Left paratracheal node measures 1.7 cm. Right paratracheal nodes measure up to 1.6 cm. Subcarinal lymph nodes measuring up to 2.2 cm. Esophagus within normal limits. Lungs/Pleura: Negative for pleural effusion. Extensive bilateral ground-glass density with interlobular septal thickening and appearance of crazy paving pattern. Patchy consolidations in the upper and lower lobes. Upper Abdomen: Gallstones.  No acute finding Musculoskeletal: No chest wall abnormality. No acute  or significant osseous findings. Review of the MIP images confirms the above findings. IMPRESSION: 1. Negative for acute pulmonary embolus. 2. Cardiomegaly. Extensive bilateral ground-glass density with interlobular septal thickening and appearance of crazy paving pattern. Patchy consolidations in the upper and lower lobes. Findings are nonspecific and could be secondary to multifocal pneumonia, pulmonary edema, or ARDS, in addition to less common etiologies such as interstitial pneumoni or alveolar proteinosis 3. Mediastinal adenopathy, likely reactive. 4. Gallstones. Aortic Atherosclerosis (ICD10-I70.0). Electronically Signed   By: Donavan Foil M.D.   On: 06/19/2022 22:45   DG Chest Portable 1 View  Result Date: 06/19/2022 CLINICAL DATA:  Shortness of breath, respiratory distress EXAM: PORTABLE CHEST 1 VIEW COMPARISON:  11/14/2014 FINDINGS: Cardiomegaly, vascular congestion. Bilateral airspace disease. No effusions. No acute bony abnormality. IMPRESSION: Cardiomegaly. Bilateral airspace disease could reflect edema or infection. Electronically Signed   By: Rolm Baptise M.D.   On: 06/19/2022 22:02    Cardiac Studies   Echocardiogram 06/20/22 1. Left ventricular ejection fraction, by estimation, is 30 to 35%. The  left ventricle has moderately decreased function. The left ventricle  demonstrates global hypokinesis. The left ventricular internal cavity size  was moderately dilated. Left  ventricular diastolic parameters are consistent with Grade III diastolic  dysfunction (restrictive).   2. Right ventricular systolic function is normal. The right ventricular  size is normal. Tricuspid regurgitation signal is inadequate for assessing  PA pressure.   3. The mitral valve is normal in structure. No evidence of mitral valve  regurgitation. No evidence of mitral stenosis.   4. The aortic valve is normal in structure. Aortic valve regurgitation is  not visualized. No aortic stenosis is present.   5.  The inferior vena cava is normal in size with <50% respiratory  variability, suggesting right atrial pressure of 8 mmHg.   Comparison(s): No prior Echocardiogram.   Patient Profile     51 y.o. male   Assessment & Plan    Acute on Chronic Combined Systolic and Diastolic Heart Failure  - Echo this admission showed EF 30-35%, grade III diastolic dysfunction, normal RV systolic function  - Patient presented complaining of sudden SOB while driving. BNP elevated to 459. CXR with cardiomegaly and bilateral airspace disease that could reflect edema vs infection  - Patient  was given IV lasix 80 mg yesterday-- output 2.8 L urine. Renal function stable  - Hold IV lasix today to preserve renal function prior to R/L heart cath today. Further diuresis recommendations pending results of right heart cath  - Following cath, plan to initiate GDMT as renal function allows. If his kidneys could tolerate it, patient would  benefit from entresto as he also has very elevated blood pressure. Plan to start carvedilol post-cath  - Continue hydralazine 10 mg TID  - Given grade III diastolic dysfunction, patient may need work up for cardiac amyloid. Will discuss with MD   Elevated Troponin  NSTEMI  - hsTn [phone] - Patient reports that he has been having shortness of breath and chest tightness on exertion for the past year. He has multiple risk factors for CAD including tobacco use, type 2 DM, HTN  - Today, patient is chest pain free. Breathing has improved - Planned for cardiac catheterization this AM. Creatinine was initially elevated to 1.70, but has improved to 1.55  - Continue ASA, lipitor  - Plan to start carvedilol after cath  - Ordered lipid panel to be drawn tomorrow AM for risk stratification   HTN Urgency  - BP is improving, but remains elevated to 151/86 this AM  - Continue hydralazine 10 mg TID  - Continue amlodipine 5 mg daily  - Plan to add carvedilol after cath. We also plan to initiate  additional GDMT as renal function allows and these medications will also improve BP   AKI on CKD stage IIIa - Closely monitoring renal function given cath today, IV lasix  - Avoiding nephrotoxic medications  - BMP tomorrow AM    For questions or updates, please contact [place] Please consult [email or web address] for contact info under        Signed, Margie Billet, PA-C  06/21/2022, 7:57 AM

## 2022-06-21 NOTE — Progress Notes (Signed)
Rounding Note    Patient Name: Troy Singh Date of Encounter: 06/21/2022  [place] Cardiologist: None   Subjective   Patient reports feeling well this AM. Breathing has improved. Denies chest pain, palpitations. Ready for cath today   Inpatient Medications    Scheduled Meds:  amLODipine  5 mg Oral Daily   aspirin EC  81 mg Oral Daily   atorvastatin  40 mg Oral Daily   Chlorhexidine Gluconate Cloth  6 each Topical Daily   hydrALAZINE  10 mg Oral Q8H   mouth rinse  15 mL Mouth Rinse 4 times per day   sodium chloride flush  3 mL Intravenous Q12H   Continuous Infusions:  sodium chloride     azithromycin (ZITHROMAX) 500 mg in sodium chloride 0.9 % 250 mL IVPB Stopped (06/21/22 0142)   cefTRIAXone (ROCEPHIN)  IV Stopped (06/20/22 2350)   heparin 1,700 Units/hr (06/21/22 0641)   nitroGLYCERIN Stopped (06/20/22 1446)   PRN Meds: docusate sodium, hydrALAZINE, mouth rinse, polyethylene glycol   Vital Signs    Vitals:   06/21/22 0400 06/21/22 0430 06/21/22 0530 06/21/22 0700  BP: (!) 158/80 (!) 156/81 (!) 140/79 (!) 151/86  Pulse: 76 81 62 71  Resp: (!) 24 (!) 28 (!) 24 (!) 23  Temp:      TempSrc:      SpO2: 95% 94% 90% 95%  Weight:      Height:        Intake/Output Summary (Last 24 hours) at 06/21/2022 0756 Last data filed at 06/21/2022 0306 Gross per 24 hour  Intake 1699.95 ml  Output 2820 ml  Net -1120.05 ml      06/19/2022    9:22 PM 06/14/2019    4:43 PM 04/29/2019    1:14 PM  Last 3 Weights  Weight (lbs) 229 lb 15 oz 230 lb 230 lb  Weight (kg) 104.3 kg 104.327 kg 104.327 kg      Telemetry    Normal Sinus Rhythm with PVCs - Personally Reviewed  ECG    Normal Sinus Rhythm, possible LVH, nonspecific IVCD - Personally Reviewed  Physical Exam   GEN: No acute distress. Laying in the bed with head elevated  Neck: No JVD Cardiac: RRR, no murmurs, rubs, or gallops.  Respiratory: Diminished breath sounds in bilateral lung bases, otherwise  clear to auscultation. Normal WOB on 5 L via [place]  GI: Soft, nontender, non-distended  MS: No edema; No deformity. Neuro:  Nonfocal  Psych: Normal affect   Labs    High Sensitivity Troponin:   Recent Labs  Lab 06/19/22 2128 06/20/22 0150 06/20/22 0842 06/20/22 1000 06/20/22 1846  TROPONINIHS 28* 1,937* 4,928* 4,835* 4,637*     Chemistry Recent Labs  Lab 06/20/22 0005 06/20/22 0150 06/20/22 0840 06/20/22 0842 06/21/22 0043 06/21/22 0447  NA 137 140 141  --  136 134*  K 3.8 4.5 4.3  --  3.8 3.9  CL 106 109 106  --  103 100  CO2 26 23 24   --  23 22  GLUCOSE 87 96 103*  --  116* 111*  BUN 25* 25* 24*  --  26* 24*  CREATININE 1.67* 1.56* 1.42*  --  1.71* 1.55*  CALCIUM 8.8* 9.0 9.2  --  8.5* 8.5*  MG  --   --   --  2.2 1.9  --   PROT 7.3 7.4  --   --   --   --   ALBUMIN 3.1* 3.3*  --   --   --   --  AST 43* 58*  --   --   --   --   ALT 18 20  --   --   --   --   ALKPHOS 60 60  --   --   --   --   BILITOT 0.4 0.6  --   --   --   --   GFRNONAA 49* 53* 59*  --  48* 54*  ANIONGAP 5 8 11   --  10 12    Lipids No results for input(s): "CHOL", "TRIG", "HDL", "LABVLDL", "LDLCALC", "CHOLHDL" in the last 168 hours.  Hematology Recent Labs  Lab 06/19/22 2128 06/19/22 2135 06/21/22 0043 06/21/22 0447  WBC 15.7*  --  9.9 10.7*  RBC 4.68  --  4.25 4.33  HGB 12.6* 14.3 11.2* 11.3*  HCT 42.0 42.0 36.0* 37.1*  MCV 89.7  --  84.7 85.7  MCH 26.9  --  26.4 26.1  MCHC 30.0  --  31.1 30.5  RDW 15.8*  --  15.8* 15.8*  PLT 336  --  269 272   Thyroid  Recent Labs  Lab 06/20/22 0842  TSH 1.260    BNP Recent Labs  Lab 06/19/22 2123  BNP 459.4*    DDimer No results for input(s): "DDIMER" in the last 168 hours.   Radiology    ECHOCARDIOGRAM COMPLETE  Result Date: 06/20/2022    ECHOCARDIOGRAM REPORT   Patient Name:   KARRAR BALBONI Date of Exam: 06/20/2022 Medical Rec #:  [id number]     Height:       63.0 in Accession #:    [id number]    Weight:       229.9 lb Date of Birth:   [birth year]     BSA:          2.052 m Patient Age:    69 years      BP:           170/110 mmHg Patient Gender: M             HR:           77 bpm. Exam Location:  Inpatient Procedure: 2D Echo, Cardiac Doppler and Color Doppler Indications:    Elevated Troponin  History:        Patient has no prior history of Echocardiogram examinations.                 Risk Factors:Hypertension and Diabetes.  Sonographer:    Bernadene Person RDCS Referring Phys: [id number] [place]  1. Left ventricular ejection fraction, by estimation, is 30 to 35%. The left ventricle has moderately decreased function. The left ventricle demonstrates global hypokinesis. The left ventricular internal cavity size was moderately dilated. Left ventricular diastolic parameters are consistent with Grade III diastolic dysfunction (restrictive).  2. Right ventricular systolic function is normal. The right ventricular size is normal. Tricuspid regurgitation signal is inadequate for assessing PA pressure.  3. The mitral valve is normal in structure. No evidence of mitral valve regurgitation. No evidence of mitral stenosis.  4. The aortic valve is normal in structure. Aortic valve regurgitation is not visualized. No aortic stenosis is present.  5. The inferior vena cava is normal in size with <50% respiratory variability, suggesting right atrial pressure of 8 mmHg. Comparison(s): No prior Echocardiogram. FINDINGS  Left Ventricle: Left ventricular ejection fraction, by estimation, is 30 to 35%. The left ventricle has moderately decreased function. The left ventricle demonstrates global hypokinesis. The left ventricular internal cavity  size was moderately dilated. There is no left ventricular hypertrophy. Left ventricular diastolic parameters are consistent with Grade III diastolic dysfunction (restrictive). Right Ventricle: The right ventricular size is normal. No increase in right ventricular wall thickness. Right ventricular systolic function is  normal. Tricuspid regurgitation signal is inadequate for assessing PA pressure. Left Atrium: Left atrial size was normal in size. Right Atrium: Right atrial size was normal in size. Pericardium: There is no evidence of pericardial effusion. Mitral Valve: The mitral valve is normal in structure. No evidence of mitral valve regurgitation. No evidence of mitral valve stenosis. Tricuspid Valve: The tricuspid valve is normal in structure. Tricuspid valve regurgitation is not demonstrated. No evidence of tricuspid stenosis. Aortic Valve: The aortic valve is normal in structure. Aortic valve regurgitation is not visualized. No aortic stenosis is present. Pulmonic Valve: The pulmonic valve was normal in structure. Pulmonic valve regurgitation is not visualized. No evidence of pulmonic stenosis. Aorta: The aortic root is normal in size and structure. Venous: The inferior vena cava is normal in size with less than 50% respiratory variability, suggesting right atrial pressure of 8 mmHg. IAS/Shunts: No atrial level shunt detected by color flow Doppler.  LEFT VENTRICLE PLAX 2D LVIDd:         6.70 cm     Diastology LVIDs:         5.50 cm     LV e' medial:    4.50 cm/s LV PW:         1.00 cm     LV E/e' medial:  27.8 LV IVS:        0.80 cm     LV e' lateral:   4.05 cm/s LVOT diam:     2.30 cm     LV E/e' lateral: 30.9 LV SV:         65 LV SV Index:   32 LVOT Area:     4.15 cm  LV Volumes (MOD) LV vol d, MOD A2C: 87.0 ml LV vol d, MOD A4C: 89.3 ml LV vol s, MOD A2C: 56.0 ml LV vol s, MOD A4C: 57.6 ml LV SV MOD A2C:     31.0 ml LV SV MOD A4C:     89.3 ml LV SV MOD BP:      30.1 ml RIGHT VENTRICLE RV S prime:     10.10 cm/s TAPSE (M-mode): 2.4 cm LEFT ATRIUM             Index        RIGHT ATRIUM           Index LA diam:        4.60 cm 2.24 cm/m   RA Area:     16.00 cm LA Vol (A2C):   62.0 ml 30.22 ml/m  RA Volume:   45.20 ml  22.03 ml/m LA Vol (A4C):   58.3 ml 28.41 ml/m LA Biplane Vol: 64.7 ml 31.53 ml/m  AORTIC VALVE LVOT  Vmax:   99.40 cm/s LVOT Vmean:  61.100 cm/s LVOT VTI:    0.156 m  AORTA Ao Root diam: 3.40 cm Ao Asc diam:  3.50 cm MITRAL VALVE MV Area (PHT): 6.17 cm     SHUNTS MV Decel Time: 123 msec     Systemic VTI:  0.16 m MV E velocity: 125.00 cm/s  Systemic Diam: 2.30 cm MV A velocity: 45.40 cm/s MV E/A ratio:  2.75 Kardie Tobb DO Electronically signed by Thomasene Ripple DO Signature Date/Time: 06/20/2022/11:31:47 AM    Final  CT Angio Chest PE W and/or Wo Contrast  Result Date: 06/19/2022 CLINICAL DATA:  Respiratory distress EXAM: CT ANGIOGRAPHY CHEST WITH CONTRAST TECHNIQUE: Multidetector CT imaging of the chest was performed using the standard protocol during bolus administration of intravenous contrast. Multiplanar CT image reconstructions and MIPs were obtained to evaluate the vascular anatomy. RADIATION DOSE REDUCTION: This exam was performed according to the departmental dose-optimization program which includes automated exposure control, adjustment of the mA and/or kV according to patient size and/or use of iterative reconstruction technique. CONTRAST:  60 mL OMNIPAQUE IOHEXOL 350 MG/ML SOLN COMPARISON:  Chest x-ray 06/19/2022 FINDINGS: Cardiovascular: Satisfactory opacification of the pulmonary arteries to the segmental level. No evidence of pulmonary embolism. Nonaneurysmal aorta. Mild atherosclerosis. Cardiomegaly. No pericardial effusion. Mediastinum/Nodes: Midline trachea. No thyroid mass. Multiple mildly enlarged mediastinal lymph nodes. Left paratracheal node measures 1.7 cm. Right paratracheal nodes measure up to 1.6 cm. Subcarinal lymph nodes measuring up to 2.2 cm. Esophagus within normal limits. Lungs/Pleura: Negative for pleural effusion. Extensive bilateral ground-glass density with interlobular septal thickening and appearance of crazy paving pattern. Patchy consolidations in the upper and lower lobes. Upper Abdomen: Gallstones.  No acute finding Musculoskeletal: No chest wall abnormality. No acute  or significant osseous findings. Review of the MIP images confirms the above findings. IMPRESSION: 1. Negative for acute pulmonary embolus. 2. Cardiomegaly. Extensive bilateral ground-glass density with interlobular septal thickening and appearance of crazy paving pattern. Patchy consolidations in the upper and lower lobes. Findings are nonspecific and could be secondary to multifocal pneumonia, pulmonary edema, or ARDS, in addition to less common etiologies such as interstitial pneumoni or alveolar proteinosis 3. Mediastinal adenopathy, likely reactive. 4. Gallstones. Aortic Atherosclerosis (ICD10-I70.0). Electronically Signed   By: Donavan Foil M.D.   On: 06/19/2022 22:45   DG Chest Portable 1 View  Result Date: 06/19/2022 CLINICAL DATA:  Shortness of breath, respiratory distress EXAM: PORTABLE CHEST 1 VIEW COMPARISON:  11/14/2014 FINDINGS: Cardiomegaly, vascular congestion. Bilateral airspace disease. No effusions. No acute bony abnormality. IMPRESSION: Cardiomegaly. Bilateral airspace disease could reflect edema or infection. Electronically Signed   By: Rolm Baptise M.D.   On: 06/19/2022 22:02    Cardiac Studies   Echocardiogram 06/20/22 1. Left ventricular ejection fraction, by estimation, is 30 to 35%. The  left ventricle has moderately decreased function. The left ventricle  demonstrates global hypokinesis. The left ventricular internal cavity size  was moderately dilated. Left  ventricular diastolic parameters are consistent with Grade III diastolic  dysfunction (restrictive).   2. Right ventricular systolic function is normal. The right ventricular  size is normal. Tricuspid regurgitation signal is inadequate for assessing  PA pressure.   3. The mitral valve is normal in structure. No evidence of mitral valve  regurgitation. No evidence of mitral stenosis.   4. The aortic valve is normal in structure. Aortic valve regurgitation is  not visualized. No aortic stenosis is present.   5.  The inferior vena cava is normal in size with <50% respiratory  variability, suggesting right atrial pressure of 8 mmHg.   Comparison(s): No prior Echocardiogram.   Patient Profile     51 y.o. male   Assessment & Plan    Acute on Chronic Combined Systolic and Diastolic Heart Failure  - Echo this admission showed EF 30-35%, grade III diastolic dysfunction, normal RV systolic function  - Patient presented complaining of sudden SOB while driving. BNP elevated to 459. CXR with cardiomegaly and bilateral airspace disease that could reflect edema vs infection  - Patient  was given IV lasix 80 mg yesterday-- output 2.8 L urine. Renal function stable  - Hold IV lasix today to preserve renal function prior to R/L heart cath today. Further diuresis recommendations pending results of right heart cath  - Following cath, plan to initiate GDMT as renal function allows. If his kidneys could tolerate it, patient would  benefit from entresto as he also has very elevated blood pressure. Plan to start carvedilol post-cath  - Continue hydralazine 10 mg TID  - Given grade III diastolic dysfunction, patient may need work up for cardiac amyloid. Will discuss with MD   Elevated Troponin  NSTEMI  - hsTn [phone] - Patient reports that he has been having shortness of breath and chest tightness on exertion for the past year. He has multiple risk factors for CAD including tobacco use, type 2 DM, HTN  - Today, patient is chest pain free. Breathing has improved - Planned for cardiac catheterization this AM. Creatinine was initially elevated to 1.70, but has improved to 1.55  - Continue ASA, lipitor  - Plan to start carvedilol after cath  - Ordered lipid panel to be drawn tomorrow AM for risk stratification   HTN Urgency  - BP is improving, but remains elevated to 151/86 this AM  - Continue hydralazine 10 mg TID  - Continue amlodipine 5 mg daily  - Plan to add carvedilol after cath. We also plan to initiate  additional GDMT as renal function allows and these medications will also improve BP   AKI on CKD stage IIIa - Closely monitoring renal function given cath today, IV lasix  - Avoiding nephrotoxic medications  - BMP tomorrow AM    For questions or updates, please contact [place] Please consult [email or web address] for contact info under        Signed, Margie Billet, PA-C  06/21/2022, 7:57 AM

## 2022-06-21 NOTE — Progress Notes (Signed)
ANTICOAGULATION CONSULT NOTE - Follow Up Consult  Pharmacy Consult for Heparin  Indication: chest pain/ACS  No Known Allergies  Patient Measurements: Height: 5\' 3"  (160 cm) Weight: 104.3 kg (229 lb 15 oz) IBW/kg (Calculated) : 56.9 Heparin Dosing Weight: 80 kg  Vital Signs: Temp: 100.9 F (38.3 C) (12/08 0355) Temp Source: Oral (12/08 0355) BP: 156/81 (12/08 0430) Pulse Rate: 81 (12/08 0430)  Labs: Recent Labs    06/19/22 2128 06/19/22 2135 06/20/22 0005 06/20/22 0840 06/20/22 0842 06/20/22 1000 06/20/22 1321 06/20/22 1846 06/20/22 2145 06/21/22 0043 06/21/22 0447  HGB 12.6* 14.3  --   --   --   --   --   --   --  11.2* 11.3*  HCT 42.0 42.0  --   --   --   --   --   --   --  36.0* 37.1*  PLT 336  --   --   --   --   --   --   --   --  269 272  HEPARINUNFRC  --   --   --   --   --   --  <0.10*  --  0.13*  --  0.23*  CREATININE  --  1.70*   < > 1.42*  --   --   --   --   --  1.71* 1.55*  TROPONINIHS 28*  --    < >  --  4,92814/08/23*  --  4,637*  --   --   --    < > = values in this interval not displayed.     Estimated Creatinine Clearance: 59.8 mL/min (A) (by C-G formula based on SCr of 1.55 mg/dL (H)).   Medications:  Infusions:   sodium chloride     azithromycin (ZITHROMAX) 500 mg in sodium chloride 0.9 % 250 mL IVPB Stopped (06/21/22 0142)   cefTRIAXone (ROCEPHIN)  IV Stopped (06/20/22 2350)   heparin 1,500 Units/hr (06/21/22 0002)   nitroGLYCERIN Stopped (06/20/22 1446)    Assessment: 67 yoM presents with hypertensive urgency.  Troponin increasing 28>>1937.  Per cardiology, likely demand ischemia however recommend to start IV heparin while work-up ongoing.  Not on anticoagulation PTA.   Baseline CBC-  Hg, pltc WNL.   Today, 06/21/2022:  Heparin level 0.23, still subtherapeutic on heparin at 1500 units/hr CBC: Hgb 11.3, Plt 272 No bleeding or complications reported.  RN reports no IV problems, pauses, or interruptions.    Goal of Therapy:  Heparin  level 0.3-0.7 units/ml Monitor platelets by anticoagulation protocol: Yes   Plan:  Give heparin 1500 units bolus IV x 1 Increase to heparin IV infusion at 1700 units/hr Heparin level 6 hours after rate change Daily heparin level and CBC  14/02/2022, PharmD, BCPS 06/21/2022 6:03 AM

## 2022-06-21 NOTE — Progress Notes (Signed)
   NAME:  Troy Singh, MRN:  [id number], DOB:  1970/11/06, LOS: 1 ADMISSION DATE:  06/19/2022, CONSULTATION DATE: 06/20/2022 REFERRING MD: Dr. Blinda Leatherwood, CHIEF COMPLAINT: Hypertension on BiPAP  History of Present Illness:  This is a 51 year old gentleman, past medical history of diabetes and hypertension, obesity, BMI of 40.  Presented to the emergency department shortness of breath.  Patient was found to be significantly hypoxemic and was placed on BiPAP and transport.  He had acute onset shortness of breath.  In the ER he was found to haveCT imaging with bilateral groundglass opacities concerning for pulmonary edema.  Patient currently takes amlodipine and hydrochlorothiazide at home for his blood pressure.  Patient has improved after he was placed on IV nitroglycerin as well as BiPAP.  He received 20 mg of IV Lasix.  No recent echocardiogram on file.  Pertinent  Medical History   Past Medical History:  Diagnosis Date   Diabetes mellitus without complication (HCC)    Gout    Hypertension      Significant Hospital Events: Including procedures, antibiotic start and stop dates in addition to other pertinent events     Interim History / Subjective:  R/LHC today with nCAD and elevated right and left heart filling pressures.   Objective   Blood pressure (!) 164/93, pulse 65, temperature 98.4 F (36.9 C), temperature source Oral, resp. rate (!) 26, height 5\' 3"  (1.6 m), weight 104.3 kg, SpO2 95 %.    FiO2 (%):  [55 %] 55 %   Intake/Output Summary (Last 24 hours) at 06/21/2022 1354 Last data filed at 06/21/2022 1339 Gross per 24 hour  Intake 1422.15 ml  Output 790 ml  Net 632.15 ml    Filed Weights   06/19/22 2122  Weight: 104.3 kg    Examination: General appearance: 51 y.o., male, NAD, conversant  Eyes: PERRL, tracking appropriately HENT: MMM Lungs: ctab, with normal respiratory effort CV: RRR, no murmur  Abdomen: Soft, non-tender; non-distended, BS present  Extremities: trace  peripheral edema, warm. Swollen and tender left elbow Skin: Normal turgor and texture; no rash Neuro: grossly nonfocal  Labs/imaging reviewed S Cr 1.55  TTE with EF 30-35%, G3DD  Resolved Hospital Problem list     Assessment & Plan:   Hypertensive emergency with flash pulmonary edema Nonocclusive MI Nonobstructive CAD Acute on chronic systolic and diastolic heart failure CAP Obesity, BMI 40 AKI on CKD stable At risk for OSA  Plan: Ceftriaxone/azithro 5d course Diuresis, AL reduction/GDMT per cardiology Follow I's and O's closely, repeat electrolytes, goal potassium greater than 4, goal magnesium greater than 2 Would recommend outpatient sleep study Transfer to tele  Best Practice (right click and "Reselect all SmartList Selections" daily)   Diet/type: NPO DVT prophylaxis: systemic heparin GI prophylaxis: N/A Lines: N/A Foley:  N/A Code Status:  full code Last date of multidisciplinary goals of care discussion [discussed with patient]     44, MD [facility] 06/21/2022 1:54 PM

## 2022-06-22 DIAGNOSIS — N179 Acute kidney failure, unspecified: Secondary | ICD-10-CM

## 2022-06-22 DIAGNOSIS — I2510 Atherosclerotic heart disease of native coronary artery without angina pectoris: Secondary | ICD-10-CM

## 2022-06-22 DIAGNOSIS — R7989 Other specified abnormal findings of blood chemistry: Secondary | ICD-10-CM

## 2022-06-22 DIAGNOSIS — I420 Dilated cardiomyopathy: Secondary | ICD-10-CM

## 2022-06-22 DIAGNOSIS — I2583 Coronary atherosclerosis due to lipid rich plaque: Secondary | ICD-10-CM

## 2022-06-22 DIAGNOSIS — I5041 Acute combined systolic (congestive) and diastolic (congestive) heart failure: Secondary | ICD-10-CM

## 2022-06-22 DIAGNOSIS — J189 Pneumonia, unspecified organism: Secondary | ICD-10-CM

## 2022-06-22 DIAGNOSIS — Z9189 Other specified personal risk factors, not elsewhere classified: Secondary | ICD-10-CM

## 2022-06-22 DIAGNOSIS — E7800 Pure hypercholesterolemia, unspecified: Secondary | ICD-10-CM

## 2022-06-22 DIAGNOSIS — E119 Type 2 diabetes mellitus without complications: Secondary | ICD-10-CM

## 2022-06-22 DIAGNOSIS — M109 Gout, unspecified: Secondary | ICD-10-CM | POA: Insufficient documentation

## 2022-06-22 DIAGNOSIS — M10229 Drug-induced gout, unspecified elbow: Secondary | ICD-10-CM

## 2022-06-22 LAB — CBC
HCT: 35.7 % — ABNORMAL LOW (ref 39.0–52.0)
Hemoglobin: 11.2 g/dL — ABNORMAL LOW (ref 13.0–17.0)
MCH: 26.8 pg (ref 26.0–34.0)
MCHC: 31.4 g/dL (ref 30.0–36.0)
MCV: 85.4 fL (ref 80.0–100.0)
Platelets: 267 10*3/uL (ref 150–400)
RBC: 4.18 MIL/uL — ABNORMAL LOW (ref 4.22–5.81)
RDW: 15.8 % — ABNORMAL HIGH (ref 11.5–15.5)
WBC: 9.3 10*3/uL (ref 4.0–10.5)
nRBC: 0 % (ref 0.0–0.2)

## 2022-06-22 LAB — BASIC METABOLIC PANEL
Anion gap: 12 (ref 5–15)
BUN: 31 mg/dL — ABNORMAL HIGH (ref 6–20)
CO2: 23 mmol/L (ref 22–32)
Calcium: 8.6 mg/dL — ABNORMAL LOW (ref 8.9–10.3)
Chloride: 102 mmol/L (ref 98–111)
Creatinine, Ser: 1.65 mg/dL — ABNORMAL HIGH (ref 0.61–1.24)
GFR, Estimated: 50 mL/min — ABNORMAL LOW (ref >60)
Glucose, Bld: 117 mg/dL — ABNORMAL HIGH (ref 70–99)
Potassium: 4.3 mmol/L (ref 3.5–5.1)
Sodium: 137 mmol/L (ref 135–145)

## 2022-06-22 LAB — LIPID PANEL
Cholesterol: 171 mg/dL (ref 0–200)
HDL: 31 mg/dL — ABNORMAL LOW (ref >40)
LDL Cholesterol: 128 mg/dL — ABNORMAL HIGH (ref 0–99)
Total CHOL/HDL Ratio: 5.5 RATIO
Triglycerides: 62 mg/dL (ref <150)
VLDL: 12 mg/dL (ref 0–40)

## 2022-06-22 LAB — PROCALCITONIN: Procalcitonin: 2.81 ng/mL

## 2022-06-22 LAB — URIC ACID: Uric Acid, Serum: 11 mg/dL — ABNORMAL HIGH (ref 3.7–8.6)

## 2022-06-22 MED ORDER — ENOXAPARIN SODIUM 40 MG/0.4ML IJ SOSY
40.0000 mg | PREFILLED_SYRINGE | Freq: Every day | INTRAMUSCULAR | Status: DC
  Administered 2022-06-22 – 2022-06-26 (×5): 40 mg via SUBCUTANEOUS
  Filled 2022-06-22 (×5): qty 0.4

## 2022-06-22 MED ORDER — ISOSORBIDE MONONITRATE ER 30 MG PO TB24
30.0000 mg | ORAL_TABLET | Freq: Every day | ORAL | Status: DC
  Administered 2022-06-22 – 2022-06-26 (×5): 30 mg via ORAL
  Filled 2022-06-22 (×5): qty 1

## 2022-06-22 MED ORDER — COLCHICINE 0.6 MG PO TABS
1.2000 mg | ORAL_TABLET | Freq: Once | ORAL | Status: AC
  Administered 2022-06-22: 1.2 mg via ORAL
  Filled 2022-06-22 (×2): qty 2

## 2022-06-22 MED ORDER — ACETAMINOPHEN 325 MG PO TABS
650.0000 mg | ORAL_TABLET | Freq: Four times a day (QID) | ORAL | Status: DC | PRN
  Administered 2022-06-22 – 2022-06-23 (×2): 650 mg via ORAL
  Filled 2022-06-22 (×2): qty 2

## 2022-06-22 MED ORDER — HYDRALAZINE HCL 25 MG PO TABS
25.0000 mg | ORAL_TABLET | Freq: Three times a day (TID) | ORAL | Status: DC
  Administered 2022-06-22 – 2022-06-26 (×12): 25 mg via ORAL
  Filled 2022-06-22 (×12): qty 1

## 2022-06-22 MED ORDER — COLCHICINE 0.6 MG PO TABS
0.6000 mg | ORAL_TABLET | Freq: Every day | ORAL | Status: DC
  Administered 2022-06-23 – 2022-06-26 (×4): 0.6 mg via ORAL
  Filled 2022-06-22 (×4): qty 1

## 2022-06-22 NOTE — Progress Notes (Signed)
Rounding Note    Patient Name: Troy Singh Date of Encounter: 06/22/2022  [facility] Cardiologist: None   Subjective   Cath yesterday showed mild non obstructive CAD with EF 20% pRCA, 40% pLAD and 20% ostial to mid LM with elevated right and left heart pressures>>c/w NICM.  Denies any chest pain or SOB this am.   Inpatient Medications    Scheduled Meds:  amLODipine  5 mg Oral Daily   aspirin EC  81 mg Oral Daily   atorvastatin  40 mg Oral Daily   azithromycin  500 mg Oral QHS   carvedilol  3.125 mg Oral BID WC   Chlorhexidine Gluconate Cloth  6 each Topical Daily   colchicine  1.2 mg Oral Once   Followed by   [START ON 06/23/2022] colchicine  0.6 mg Oral Daily   furosemide  40 mg Intravenous BID   hydrALAZINE  10 mg Oral Q8H   mouth rinse  15 mL Mouth Rinse 4 times per day   sodium chloride flush  3 mL Intravenous Q12H   sodium chloride flush  3 mL Intravenous Q12H   Continuous Infusions:  sodium chloride     cefTRIAXone (ROCEPHIN)  IV Stopped (06/21/22 2213)   nitroGLYCERIN Stopped (06/20/22 1446)   PRN Meds: sodium chloride, docusate sodium, hydrALAZINE, mouth rinse, polyethylene glycol, sodium chloride flush   Vital Signs    Vitals:   06/22/22 0701 06/22/22 0800 06/22/22 0900 06/22/22 1018  BP: (!) 153/77 (!) 160/75 (!) 156/104 (!) 141/74  Pulse: 60 65 63   Resp: 16 (!) 21 (!) 25   Temp:  98.4 F (36.9 C)    TempSrc:  Oral    SpO2: 97% 97% 98%   Weight:      Height:        Intake/Output Summary (Last 24 hours) at 06/22/2022 1055 Last data filed at 06/22/2022 1021 Gross per 24 hour  Intake 1216.2 ml  Output 1780 ml  Net -563.8 ml       06/22/2022    5:00 AM 06/19/2022    9:22 PM 06/14/2019    4:43 PM  Last 3 Weights  Weight (lbs) 212 lb 8.4 oz 229 lb 15 oz 230 lb  Weight (kg) 96.4 kg 104.3 kg 104.327 kg      Telemetry    NSR- Personally Reviewed  ECG    No new EKG to review - Personally Reviewed  Physical Exam   GEN:  Well nourished, well developed in no acute distress HEENT: Normal NECK: No JVD; No carotid bruits LYMPHATICS: No lymphadenopathy CARDIAC:RRR, no murmurs, rubs, gallops RESPIRATORY:  Clear to auscultation without rales, wheezing or rhonchi  ABDOMEN: Soft, non-tender, non-distended MUSCULOSKELETAL:  No edema; No deformity  SKIN: Warm and dry NEUROLOGIC:  Alert and oriented x 3 PSYCHIATRIC:  Normal affect  Labs    High Sensitivity Troponin:   Recent Labs  Lab 06/19/22 2128 06/20/22 0150 06/20/22 0842 06/20/22 1000 06/20/22 1846  TROPONINIHS 28* 1,937* 4,928* 4,835* 4,637*      Chemistry Recent Labs  Lab 06/20/22 0005 06/20/22 0150 06/20/22 0840 06/20/22 0842 06/21/22 0043 06/21/22 0447 06/21/22 0937 06/21/22 0944 06/21/22 1547 06/22/22 0255  NA 137 140   < >  --  136 134*   < > 137 138 137  K 3.8 4.5   < >  --  3.8 3.9   < > 3.9 4.2 4.3  CL 106 109   < >  --  103 100  --   --  103 102  CO2 26 23   < >  --  23 22  --   --  24 23  GLUCOSE 87 96   < >  --  116* 111*  --   --  114* 117*  BUN 25* 25*   < >  --  26* 24*  --   --  29* 31*  CREATININE 1.67* 1.56*   < >  --  1.71* 1.55*  --   --  1.63* 1.65*  CALCIUM 8.8* 9.0   < >  --  8.5* 8.5*  --   --  8.8* 8.6*  MG  --   --   --  2.2 1.9  --   --   --  2.5*  --   PROT 7.3 7.4  --   --   --   --   --   --   --   --   ALBUMIN 3.1* 3.3*  --   --   --   --   --   --   --   --   AST 43* 58*  --   --   --   --   --   --   --   --   ALT 18 20  --   --   --   --   --   --   --   --   ALKPHOS 60 60  --   --   --   --   --   --   --   --   BILITOT 0.4 0.6  --   --   --   --   --   --   --   --   GFRNONAA 49* 53*   < >  --  48* 54*  --   --  50* 50*  ANIONGAP 5 8   < >  --  10 12  --   --  11 12   < > = values in this interval not displayed.     Lipids  Recent Labs  Lab 06/22/22 0255  CHOL 171  TRIG 62  HDL 31*  LDLCALC 128*  CHOLHDL 5.5    Hematology Recent Labs  Lab 06/21/22 0043 06/21/22 0447 06/21/22 0937  06/21/22 0944 06/22/22 0255  WBC 9.9 10.7*  --   --  9.3  RBC 4.25 4.33  --   --  4.18*  HGB 11.2* 11.3* 11.9* 11.6* 11.2*  HCT 36.0* 37.1* 35.0* 34.0* 35.7*  MCV 84.7 85.7  --   --  85.4  MCH 26.4 26.1  --   --  26.8  MCHC 31.1 30.5  --   --  31.4  RDW 15.8* 15.8*  --   --  15.8*  PLT 269 272  --   --  267    Thyroid  Recent Labs  Lab 06/20/22 0842  TSH 1.260     BNP Recent Labs  Lab 06/19/22 2123  BNP 459.4*     DDimer No results for input(s): "DDIMER" in the last 168 hours.   Radiology    CARDIAC CATHETERIZATION  Result Date: 06/21/2022   Prox RCA lesion is 20% stenosed.   Prox LAD lesion is 40% stenosed.   Ost LM to Mid LM lesion is 20% stenosed. Mild non-obstructive CAD Elevated right and left heart pressures Non-ischemic cardiomyopathy Recommendations: Medical management of mild CAD. He will need further diuresis. Will resume IV Lasix. Follow  renal function closely. GDMT as tolerated.    Cardiac Studies   Echocardiogram 06/20/22 1. Left ventricular ejection fraction, by estimation, is 30 to 35%. The  left ventricle has moderately decreased function. The left ventricle  demonstrates global hypokinesis. The left ventricular internal cavity size  was moderately dilated. Left  ventricular diastolic parameters are consistent with Grade III diastolic  dysfunction (restrictive).   2. Right ventricular systolic function is normal. The right ventricular  size is normal. Tricuspid regurgitation signal is inadequate for assessing  PA pressure.   3. The mitral valve is normal in structure. No evidence of mitral valve  regurgitation. No evidence of mitral stenosis.   4. The aortic valve is normal in structure. Aortic valve regurgitation is  not visualized. No aortic stenosis is present.   5. The inferior vena cava is normal in size with <50% respiratory  variability, suggesting right atrial pressure of 8 mmHg.   Comparison(s): No prior Echocardiogram.   Patient  Profile     51 y.o. male   Assessment & Plan    Acute on Chronic Combined Systolic and Diastolic Heart Failure  - Echo this admission showed EF 30-35%, grade III diastolic dysfunction, normal RV systolic function  - Patient presented complaining of sudden SOB while driving. BNP elevated to 459. CXR with cardiomegaly and bilateral airspace disease that could reflect edema vs infection  -LHC with mild non obstructive CAD>>suspect his DCM is related to HTN -Cath yesterday with elevated filling pressures and severe PHTN with PAP 74/48mmHg, RA , LVEDP -now on lasix 40mg  IV BID -he put out 1.78L yesterday and is net neg 1.57L since admit -SCr stable at 1.65 today post cath (1.7>>1.67>>1.56>>1.42>>1.71>>1.55>>1.63>>1.65) -given AKI on CKD, will hold on starting ACE/ARB/ARNi/MRA -continue IV lasix and follow strict I&O's, daily weights and renal function closely -increase Hydralazine to 25mg  TID and add Imdur 30mg  daily -started on low dose Carvedilol 3.125mg  BID -add Jardiance 10mg  daily -Given grade III diastolic dysfunction and AKI on CKD recommend PYP scan to rule out amyloid>>will order today  Elevated Troponin  NSTEMI  ASCAD - hsTn [phone] - Patient reports that he has been having shortness of breath and chest tightness on exertion for the past year. He has multiple risk factors for CAD including tobacco use, type 2 DM, HTN  - LHC yesterday with mild non obstructive CAD - Continue ASA, lipitor   HTN Urgency  - BP is improving, but remains elevated  - increase Hydralazine to 25mg  TID - stop IV NTG and start Imdur 30mg  daily - Continue amlodipine 5 mg daily and carvedilol 3.125mg  BID - continue to titrate Carvedilol and Hydralazine  AKI on CKD stage IIIa - Closely monitoring renal function post cath and with diuresis - Avoiding nephrotoxic medications  - BMP tomorrow AM   HLD -LDL goal < 70 -LDL this admit 128 -started on Atorvastatin 40mg  daily -will need  FLP and ALT In 6 weeks   I have spent a total of 35 minutes with patient reviewing cath , telemetry, EKGs, labs and examining patient as well as establishing an assessment and plan that was discussed with the patient.  > 50% of time was spent in direct patient care.    For questions or updates, please contact [facility] Please consult [email or web address] for contact info under        Signed, , MD  06/22/2022, 10:55 AM

## 2022-06-22 NOTE — Progress Notes (Signed)
Mobility Specialist - Progress Note  Pre-mobility:  71 bpm HR, 98% SpO2 During mobility: 93 bpm HR, 91% SpO2 Post-mobility: 69 bpm HR, 95% SPO2   06/22/22 1437  Mobility  Activity Ambulated independently in hallway  Level of Assistance Independent after set-up  Assistive Device None  Distance Ambulated (ft) 350 ft  Range of Motion/Exercises Active  Activity Response Tolerated well  Mobility Referral Yes  $Mobility charge 1 Mobility   Pt was found in bed and agreeable to ambulate. Stated his only difficulty with getting out of bed was his gout on his L arm. At EOS returned to bed with necessities in reach and RN notified of session.  Billey Chang Mobility Specialist

## 2022-06-22 NOTE — Progress Notes (Signed)
Pt transfer to 4W. Report given. Pt stable at this time

## 2022-06-22 NOTE — Progress Notes (Signed)
PROGRESS NOTE  Troy Singh [id number] DOB: 14-Nov-1970   PCP: Patient, No Pcp Per  Patient is from: Home.  Independently ambulates at baseline.  DOA: 06/19/2022 LOS: 2  Chief complaints Chief Complaint  Patient presents with   Respiratory Distress     Brief Narrative / Interim history: 51 year old M with PMH of DM-2, HTN, obesity, former tobacco use and noncompliance with medication presenting with shortness of breath, and admitted for acute respiratory failure with hypoxia in the setting of acute combined CHF, non-STEMI, hypertensive emergency, CAP and AKI.  CXR showed cardiomegaly and bilateral airspace disease.  BNP 460.  Procalcitonin elevated.  Patient was started on CAP coverage, IV diuretics on BiPAP and admitted to ICU by pulmonology.  TTE with LVEF of 30 to 35%, GH, G3-DD.  R/LHC with nonobstructive CAD, NICM and elevated right and left heart pressures.  Eventually, his respiratory status improved and he was transferred to hospitalist service on 06/22/2022.  Cardiology following.  Subjective: Seen and examined earlier this morning.  No major events overnight of this morning.  He reports left elbow pain.  Right elbow pain improved.  Here with ports history of gout but does not take medication.  Breathing has improved.  Denies chest pain, GI symptoms or urinary symptoms.    Objective: Vitals:   06/22/22 0800 06/22/22 0900 06/22/22 1018 06/22/22 1109  BP: (!) 160/75 (!) 156/104 (!) 141/74 136/75  Pulse: 65 63  62  Resp: (!) 21 (!) 25  20  Temp: 98.4 F (36.9 C)   99.5 F (37.5 C)  TempSrc: Oral   Oral  SpO2: 97% 98%  100%  Weight:      Height:        Examination:  GENERAL: No apparent distress.  Nontoxic. HEENT: MMM.  Vision and hearing grossly intact.  NECK: Supple.  No apparent JVD.  RESP:  No IWOB.  Fair aeration bilaterally. CVS:  RRR. Heart sounds normal.  ABD/GI/GU: BS+. Abd soft, NTND.  MSK/EXT:  Moves extremities.  Tenderness and swelling in bilateral  elbows.  Limited ROM due to pain. SKIN: no apparent skin lesion or wound NEURO: Awake, alert and oriented appropriately.  No apparent focal neuro deficit. PSYCH: Calm. Normal affect.   Procedures:  12/8-R/LHC with nonobstructive CAD, NICM, increased right and left heart pressures.  Microbiology summarized: COVID-19 and influenza PCR nonreactive. MRSA PCR screen negative.    Assessment and plan: Principal Problem:   Hypertensive emergency Active Problems:   Elevated troponin   DM type 2, goal HbA1c < 7% (HCC)   Acute congestive heart failure (HCC)   DCM (dilated cardiomyopathy) (HCC)   Acute combined systolic and diastolic heart failure (HCC)   Coronary artery disease due to lipid rich plaque   Pure hypercholesterolemia   AKI (acute kidney injury) ([place])   CAP (community acquired pneumonia)   At risk for sleep apnea   Gout flare   Morbid obesity ([place])  Acute respiratory failure with hypoxia: Documented desaturation to 87 and 88%.  Likely due to acute CHF, hypertensive emergency, non-STEMI and CAP.  Cannot rule out sleep apnea.  Improving. -Treat treatable causes -Wean off oxygen as able -Incentive spirometry, OOB, flutter valve  Acute combined CHF: Likely due to uncontrolled hypertension.  BNP elevated to 460. TTE with LVEF of 30 to 35%, GH, G3-DD.  R/LHC with nonobstructive CAD, NICM and elevated right and left heart pressures.  On IV Lasix.  About 1.8 L UOP/24 hours.  Net -1.6 L.  Creatinine stable. -Cardiology following -  On IV Lasix 40 mg twice daily -GDMT-Coreg, Imdur and hydralazine.  Cardiology titrating -Strict intake and output -Blood pressure control -Needs outpatient sleep study  Hypertensive emergency: BP improved. -Cardiac meds as above in addition to low-dose amlodipine -Needs outpatient sleep study  Non-STEMI/nonobstructive CAD: R/LHC as above. -Cardiac meds as above -Continue aspirin  Community-acquired pneumonia: Pro-Cal elevated. -Continue ceftriaxone  and azithromycin to complete 5 days course -Pulmonary toilet  AKI/CKD-3A: Unknown baseline but Cr 1.43 in 10/2021.  Likely due to uncontrolled hypertension.  Stable. Recent Labs    10/22/21 1807 06/19/22 2135 06/20/22 0005 06/20/22 0150 06/20/22 0840 06/21/22 0043 06/21/22 0447 06/21/22 1547 06/22/22 0255  BUN 24* 34* 25* 25* 24* 26* 24* 29* 31*  CREATININE 1.43* 1.70* 1.67* 1.56* 1.42* 1.71* 1.55* 1.63* 1.65*  -Continue monitoring   Acute gout flare: Likely from diuretics.  Reports history of gout.  Has bilateral elbow pain and mild 10.  Uric acid elevated to 11 -Colchicine 1.2 mg followed by 0.6 mg daily -May add steroid if no improvement  At risk for sleep apnea -Needs outpatient sleep study  Noncompliance with medication: Patient has no PCP.  Not compliant with antihypertensive meds. -Counseled on the importance -TOC consult  Morbid obesity Body mass index is 37.65 kg/m.          DVT prophylaxis:  enoxaparin (LOVENOX) injection 40 mg Start: 06/22/22 1515 SCDs Start: 06/20/22 0555  Code Status: Full code Family Communication: None at bedside Level of care: Telemetry Status is: Inpatient Remains inpatient appropriate because: Due to hypoxic respiratory failure, acute CHF and pneumonia   Final disposition:  Consultants:  Cardiology Pulmonology  Sch Meds:  Scheduled Meds:  amLODipine  5 mg Oral Daily   aspirin EC  81 mg Oral Daily   atorvastatin  40 mg Oral Daily   azithromycin  500 mg Oral QHS   carvedilol  3.125 mg Oral BID WC   [START ON 06/23/2022] colchicine  0.6 mg Oral Daily   enoxaparin (LOVENOX) injection  40 mg Subcutaneous Q24H   furosemide  40 mg Intravenous BID   hydrALAZINE  25 mg Oral Q8H   isosorbide mononitrate  30 mg Oral Daily   mouth rinse  15 mL Mouth Rinse 4 times per day   sodium chloride flush  3 mL Intravenous Q12H   sodium chloride flush  3 mL Intravenous Q12H   Continuous Infusions:  sodium chloride     cefTRIAXone  (ROCEPHIN)  IV Stopped (06/21/22 2213)   PRN Meds:.sodium chloride, docusate sodium, hydrALAZINE, mouth rinse, polyethylene glycol, sodium chloride flush  Antimicrobials: Anti-infectives (From admission, onward)    Start     Dose/Rate Route Frequency Ordered Stop   06/21/22 2200  azithromycin (ZITHROMAX) tablet 500 mg        500 mg Oral Daily at bedtime 06/21/22 1405 06/24/22 2159   06/21/22 1000  azithromycin (ZITHROMAX) tablet 250 mg  Status:  Discontinued       See Hyperspace for full Linked Orders Report.   250 mg Oral Daily 06/19/22 2238 06/19/22 2245   06/21/22 0200  azithromycin (ZITHROMAX) 500 mg in sodium chloride 0.9 % 250 mL IVPB  Status:  Discontinued        500 mg 250 mL/hr over 60 Minutes Intravenous Every 24 hours 06/20/22 1338 06/21/22 1405   06/21/22 0000  cefTRIAXone (ROCEPHIN) 2 g in sodium chloride 0.9 % 100 mL IVPB        2 g 200 mL/hr over 30 Minutes Intravenous Every 24  hours 06/20/22 1338 06/24/22 2159   06/20/22 1000  azithromycin (ZITHROMAX) tablet 500 mg  Status:  Discontinued       See Hyperspace for full Linked Orders Report.   500 mg Oral Daily 06/19/22 2238 06/19/22 2245   06/19/22 2300  azithromycin (ZITHROMAX) 500 mg in sodium chloride 0.9 % 250 mL IVPB        500 mg 250 mL/hr over 60 Minutes Intravenous  Once 06/19/22 2245 06/20/22 0334   06/19/22 2245  cefTRIAXone (ROCEPHIN) 2 g in sodium chloride 0.9 % 100 mL IVPB        2 g 200 mL/hr over 30 Minutes Intravenous  Once 06/19/22 2238 06/20/22 0212        I have personally reviewed the following labs and images: CBC: Recent Labs  Lab 06/19/22 2128 06/19/22 2135 06/21/22 0043 06/21/22 0447 06/21/22 0937 06/21/22 0944 06/22/22 0255  WBC 15.7*  --  9.9 10.7*  --   --  9.3  NEUTROABS 13.4*  --   --   --   --   --   --   HGB 12.6*   < > 11.2* 11.3* 11.9* 11.6* 11.2*  HCT 42.0   < > 36.0* 37.1* 35.0* 34.0* 35.7*  MCV 89.7  --  84.7 85.7  --   --  85.4  PLT 336  --  269 272  --   --  267   <  > = values in this interval not displayed.   BMP &GFR Recent Labs  Lab 06/20/22 0840 06/20/22 0842 06/21/22 0043 06/21/22 0447 06/21/22 0937 06/21/22 0944 06/21/22 1547 06/22/22 0255  NA 141  --  136 134* 139 137 138 137  K 4.3  --  3.8 3.9 4.0 3.9 4.2 4.3  CL 106  --  103 100  --   --  103 102  CO2 24  --  23 22  --   --  24 23  GLUCOSE 103*  --  116* 111*  --   --  114* 117*  BUN 24*  --  26* 24*  --   --  29* 31*  CREATININE 1.42*  --  1.71* 1.55*  --   --  1.63* 1.65*  CALCIUM 9.2  --  8.5* 8.5*  --   --  8.8* 8.6*  MG  --  2.2 1.9  --   --   --  2.5*  --   PHOS  --   --  3.3  --   --   --   --   --    Estimated Creatinine Clearance: 53.9 mL/min (A) (by C-G formula based on SCr of 1.65 mg/dL (H)). Liver & Pancreas: Recent Labs  Lab 06/20/22 0005 06/20/22 0150  AST 43* 58*  ALT 18 20  ALKPHOS 60 60  BILITOT 0.4 0.6  PROT 7.3 7.4  ALBUMIN 3.1* 3.3*   No results for input(s): "LIPASE", "AMYLASE" in the last 168 hours. No results for input(s): "AMMONIA" in the last 168 hours. Diabetic: No results for input(s): "HGBA1C" in the last 72 hours. Recent Labs  Lab 06/20/22 0849 06/20/22 1149 06/20/22 1646  GLUCAP 91 90 104*   Cardiac Enzymes: No results for input(s): "CKTOTAL", "CKMB", "CKMBINDEX", "TROPONINI" in the last 168 hours. No results for input(s): "PROBNP" in the last 8760 hours. Coagulation Profile: No results for input(s): "INR", "PROTIME" in the last 168 hours. Thyroid Function Tests: Recent Labs    06/20/22 0842  TSH 1.260  Lipid Profile: Recent Labs    06/22/22 0255  CHOL 171  HDL 31*  LDLCALC 128*  TRIG 62  CHOLHDL 5.5   Anemia Panel: No results for input(s): "VITAMINB12", "FOLATE", "FERRITIN", "TIBC", "IRON", "RETICCTPCT" in the last 72 hours. Urine analysis: No results found for: "COLORURINE", "APPEARANCEUR", "LABSPEC", "PHURINE", "GLUCOSEU", "HGBUR", "BILIRUBINUR", "KETONESUR", "PROTEINUR", "UROBILINOGEN", "NITRITE",  "LEUKOCYTESUR" Sepsis Labs: Invalid input(s): "PROCALCITONIN", "LACTICIDVEN"  Microbiology: Recent Results (from the past 240 hour(s))  Resp Panel by RT-PCR (Flu A&B, Covid) Anterior Nasal Swab     Status: None   Collection Time: 06/20/22  1:48 PM   Specimen: Anterior Nasal Swab  Result Value Ref Range Status   SARS Coronavirus 2 by RT PCR NEGATIVE NEGATIVE Final    Comment: (NOTE) SARS-CoV-2 target nucleic acids are NOT DETECTED.  The SARS-CoV-2 RNA is generally detectable in upper respiratory specimens during the acute phase of infection. The lowest concentration of SARS-CoV-2 viral copies this assay can detect is 138 copies/mL. A negative result does not preclude SARS-Cov-2 infection and should not be used as the sole basis for treatment or other patient management decisions. A negative result may occur with  improper specimen collection/handling, submission of specimen other than nasopharyngeal swab, presence of viral mutation(s) within the areas targeted by this assay, and inadequate number of viral copies(<138 copies/mL). A negative result must be combined with clinical observations, patient history, and epidemiological information. The expected result is Negative.  Fact Sheet for Patients:  EntrepreneurPulse.com.au  Fact Sheet for Healthcare Providers:  IncredibleEmployment.be  This test is no t yet approved or cleared by the [place] FDA and  has been authorized for detection and/or diagnosis of SARS-CoV-2 by FDA under an Emergency Use Authorization (EUA). This EUA will remain  in effect (meaning this test can be used) for the duration of the COVID-19 declaration under Section 564(b)(1) of the Act, 21 U.S.C.section 360bbb-3(b)(1), unless the authorization is terminated  or revoked sooner.       Influenza A by PCR NEGATIVE NEGATIVE Final   Influenza B by PCR NEGATIVE NEGATIVE Final    Comment: (NOTE) The Xpert Xpress  SARS-CoV-2/FLU/RSV plus assay is intended as an aid in the diagnosis of influenza from Nasopharyngeal swab specimens and should not be used as a sole basis for treatment. Nasal washings and aspirates are unacceptable for Xpert Xpress SARS-CoV-2/FLU/RSV testing.  Fact Sheet for Patients: EntrepreneurPulse.com.au  Fact Sheet for Healthcare Providers: IncredibleEmployment.be  This test is not yet approved or cleared by the [place] FDA and has been authorized for detection and/or diagnosis of SARS-CoV-2 by FDA under an Emergency Use Authorization (EUA). This EUA will remain in effect (meaning this test can be used) for the duration of the COVID-19 declaration under Section 564(b)(1) of the Act, 21 U.S.C. section 360bbb-3(b)(1), unless the authorization is terminated or revoked.  Performed at [facility], [place] [address], [place], [place] [address]   MRSA Next Gen by PCR, Nasal     Status: None   Collection Time: 06/20/22  1:49 PM   Specimen: Anterior Nasal Swab  Result Value Ref Range Status   MRSA by PCR Next Gen NOT DETECTED NOT DETECTED Final    Comment: (NOTE) The GeneXpert MRSA Assay (FDA approved for NASAL specimens only), is one component of a comprehensive MRSA colonization surveillance program. It is not intended to diagnose MRSA infection nor to guide or monitor treatment for MRSA infections. Test performance is not FDA approved in patients less than 60 years old. Performed  at [facility], [place] [address] [place], [place] [address]     Radiology Studies: No results found.    Antoni Stefan T. [place]  If 7PM-7AM, please contact night-coverage [email or web address] 06/22/2022, 2:16 PM

## 2022-06-23 DIAGNOSIS — I4729 Other ventricular tachycardia: Secondary | ICD-10-CM

## 2022-06-23 DIAGNOSIS — R509 Fever, unspecified: Secondary | ICD-10-CM | POA: Insufficient documentation

## 2022-06-23 LAB — CBC
HCT: 35.9 % — ABNORMAL LOW (ref 39.0–52.0)
Hemoglobin: 11.2 g/dL — ABNORMAL LOW (ref 13.0–17.0)
MCH: 26.3 pg (ref 26.0–34.0)
MCHC: 31.2 g/dL (ref 30.0–36.0)
MCV: 84.3 fL (ref 80.0–100.0)
Platelets: 296 10*3/uL (ref 150–400)
RBC: 4.26 MIL/uL (ref 4.22–5.81)
RDW: 15.4 % (ref 11.5–15.5)
WBC: 10 10*3/uL (ref 4.0–10.5)
nRBC: 0 % (ref 0.0–0.2)

## 2022-06-23 LAB — COMPREHENSIVE METABOLIC PANEL
ALT: 19 U/L (ref 0–44)
AST: 22 U/L (ref 15–41)
Albumin: 2.8 g/dL — ABNORMAL LOW (ref 3.5–5.0)
Alkaline Phosphatase: 51 U/L (ref 38–126)
Anion gap: 11 (ref 5–15)
BUN: 29 mg/dL — ABNORMAL HIGH (ref 6–20)
CO2: 25 mmol/L (ref 22–32)
Calcium: 8.6 mg/dL — ABNORMAL LOW (ref 8.9–10.3)
Chloride: 100 mmol/L (ref 98–111)
Creatinine, Ser: 1.55 mg/dL — ABNORMAL HIGH (ref 0.61–1.24)
GFR, Estimated: 54 mL/min — ABNORMAL LOW (ref >60)
Glucose, Bld: 117 mg/dL — ABNORMAL HIGH (ref 70–99)
Potassium: 3.9 mmol/L (ref 3.5–5.1)
Sodium: 136 mmol/L (ref 135–145)
Total Bilirubin: 0.6 mg/dL (ref 0.3–1.2)
Total Protein: 6.9 g/dL (ref 6.5–8.1)

## 2022-06-23 LAB — BRAIN NATRIURETIC PEPTIDE: B Natriuretic Peptide: 320.2 pg/mL — ABNORMAL HIGH (ref 0.0–100.0)

## 2022-06-23 LAB — C-REACTIVE PROTEIN: CRP: 14.9 mg/dL — ABNORMAL HIGH (ref <1.0)

## 2022-06-23 LAB — PHOSPHORUS: Phosphorus: 3.9 mg/dL (ref 2.5–4.6)

## 2022-06-23 LAB — MAGNESIUM: Magnesium: 2.1 mg/dL (ref 1.7–2.4)

## 2022-06-23 LAB — SEDIMENTATION RATE: Sed Rate: 55 mm/hr — ABNORMAL HIGH (ref 0–16)

## 2022-06-23 MED ORDER — EMPAGLIFLOZIN 10 MG PO TABS
10.0000 mg | ORAL_TABLET | Freq: Every day | ORAL | Status: DC
  Administered 2022-06-23 – 2022-06-26 (×4): 10 mg via ORAL
  Filled 2022-06-23 (×4): qty 1

## 2022-06-23 MED ORDER — AMLODIPINE BESYLATE 5 MG PO TABS
2.5000 mg | ORAL_TABLET | Freq: Every day | ORAL | Status: DC
  Administered 2022-06-23 – 2022-06-26 (×4): 2.5 mg via ORAL
  Filled 2022-06-23 (×4): qty 1

## 2022-06-23 NOTE — Progress Notes (Signed)
PROGRESS NOTE  Troy Singh LNL:[id number] DOB: Jan 15, 1971   PCP: Patient, No Pcp Per  Patient is from: Home.  Independently ambulates at baseline.  DOA: 06/19/2022 LOS: 3  Chief complaints Chief Complaint  Patient presents with   Respiratory Distress     Brief Narrative / Interim history: 51 year old M with PMH of DM-2, HTN, obesity, former tobacco use and noncompliance with medication presenting with shortness of breath, and admitted for acute respiratory failure with hypoxia in the setting of acute combined CHF, non-STEMI, hypertensive emergency, CAP and AKI.  CXR showed cardiomegaly and bilateral airspace disease.  BNP 460.  Procalcitonin elevated.  Patient was started on CAP coverage, IV diuretics on BiPAP and admitted to ICU by pulmonology.  TTE with LVEF of 30 to 35%, GH, G3-DD.  R/LHC with nonobstructive CAD, NICM and elevated right and left heart pressures.  Eventually, his respiratory status improved and he was transferred to hospitalist service on 06/22/2022.  Cardiology following.  Subjective: Seen and examined earlier this morning.  About 6 beats of V. tach overnight.  Not symptomatic.  He spiked fever to 100.1.8 about 6 PM.  No complaints other than some stiffness in his elbows.  Pain is better.  Objective: Vitals:   06/23/22 0304 06/23/22 0421 06/23/22 0600 06/23/22 1325  BP:  98/67 135/71 132/74  Pulse:  64  62  Resp:  17  13  Temp:  100.3 F (37.9 C) 99.6 F (37.6 C) 98 F (36.7 C)  TempSrc:  Oral  Oral  SpO2:  96%  95%  Weight: 92.7 kg     Height:        Examination:  GENERAL: No apparent distress.  Nontoxic. HEENT: MMM.  Vision and hearing grossly intact.  NECK: Supple.  No apparent JVD.  RESP:  No IWOB.  Fair aeration bilaterally. CVS:  RRR. Heart sounds normal.  ABD/GI/GU: BS+. Abd soft, NTND.  MSK/EXT:  Moves extremities.  Mild swelling just below bilateral elbows.  No erythema.  Mild tenderness. SKIN: no apparent skin lesion or wound NEURO: Awake  and alert. Oriented appropriately.  No apparent focal neuro deficit. PSYCH: Calm. Normal affect.   Procedures:  12/8-R/LHC with nonobstructive CAD, NICM, increased right and left heart pressures.  Microbiology summarized: COVID-19 and influenza PCR nonreactive. MRSA PCR screen negative.  Blood cultures NGTD  Assessment and plan: Principal Problem:   Hypertensive emergency Active Problems:   Elevated troponin   DM type 2, goal HbA1c < 7% (HCC)   Acute congestive heart failure (HCC)   DCM (dilated cardiomyopathy) (HCC)   Acute combined systolic and diastolic heart failure (HCC)   Coronary artery disease due to lipid rich plaque   Pure hypercholesterolemia   AKI (acute kidney injury) ([place])   CAP (community acquired pneumonia)   At risk for sleep apnea   Gout flare   Morbid obesity (HCC)   NSVT (nonsustained ventricular tachycardia) (HCC)   Fever  Acute respiratory failure with hypoxia: Documented desaturation to 87 and 88%.  Likely due to acute CHF, hypertensive emergency, non-STEMI and CAP.  Cannot rule out sleep apnea.  Respiratory failure resolved. -Treat treatable causes -Incentive spirometry, OOB, flutter valve  Acute combined CHF: Likely due to uncontrolled hypertension.  BNP elevated to 460. TTE with LVEF of 30 to 35%, GH, G3-DD.  R/LHC with nonobstructive CAD, NICM and elevated right and left heart pressures.  On IV Lasix.  Net -3 L.  BP and Cr improved. -Cardiology following.  On IV Lasix, Coreg, Imdur and Jardiance.  Weaning off amlodipine. -Strict intake and output -Needs outpatient sleep study  NSVT: About 6 runs of VT overnight.  Not symptomatic. -Continue telemetry monitoring -Optimize electrolytes -Coreg as above  Hypertensive emergency: BP improved. -Cardiac/antihypertensive meds as above -Needs outpatient sleep study  Non-STEMI/nonobstructive CAD: R/LHC as above. -Cardiac meds as above -Continue aspirin and Lipitor  Community-acquired pneumonia:  Pro-Cal elevated. -Continue ceftriaxone and azithromycin to complete 5 days course -Pulmonary toilet  AKI/CKD-3A: Unknown baseline but Cr 1.43 in 10/2021.  Likely due to uncontrolled hypertension.  Stable. Recent Labs    10/22/21 1807 06/19/22 2135 06/20/22 0005 06/20/22 0150 06/20/22 0840 06/21/22 0043 06/21/22 0447 06/21/22 1547 06/22/22 0255 06/23/22 0343  BUN 24* 34* 25* 25* 24* 26* 24* 29* 31* 29*  CREATININE 1.43* 1.70* 1.67* 1.56* 1.42* 1.71* 1.55* 1.63* 1.65* 1.55*  -Continue monitoring   Acute gout flare in bilateral elbows: Likely from diuretics.  Reports history of gout.  Uric acid elevated to 11.  CRP and ESR elevated.  Improved with colchicine. -Continue colchicine  At risk for sleep apnea -Needs outpatient sleep study  Noncompliance with medication: Patient has no PCP.  Not compliant with antihypertensive meds. -Counseled on the importance -TOC consult  Fever: Spiked fever to 101.8 the evening of 12/9.  Likely from gout.  Blood cultures negative.  Already on antibiotics for pneumonia.  No UTI symptoms.  No leukocytosis. -Continue monitoring  Morbid obesity Body mass index is 36.2 kg/m.          DVT prophylaxis:  enoxaparin (LOVENOX) injection 40 mg Start: 06/22/22 1500 SCDs Start: 06/20/22 0555  Code Status: Full code Family Communication: None at bedside Level of care: Telemetry Status is: Inpatient Remains inpatient appropriate because: Acute CHF and pneumonia   Final disposition: Likely home with home health Consultants:  Cardiology Pulmonology admitted patient  Sch Meds:  Scheduled Meds:  amLODipine  2.5 mg Oral Daily   aspirin EC  81 mg Oral Daily   atorvastatin  40 mg Oral Daily   azithromycin  500 mg Oral QHS   carvedilol  3.125 mg Oral BID WC   colchicine  0.6 mg Oral Daily   empagliflozin  10 mg Oral Daily   enoxaparin (LOVENOX) injection  40 mg Subcutaneous Daily   furosemide  40 mg Intravenous BID   hydrALAZINE  25 mg Oral  Q8H   isosorbide mononitrate  30 mg Oral Daily   mouth rinse  15 mL Mouth Rinse 4 times per day   sodium chloride flush  3 mL Intravenous Q12H   sodium chloride flush  3 mL Intravenous Q12H   Continuous Infusions:  sodium chloride     cefTRIAXone (ROCEPHIN)  IV 2 g (06/22/22 2116)   PRN Meds:.sodium chloride, acetaminophen, docusate sodium, hydrALAZINE, mouth rinse, polyethylene glycol, sodium chloride flush  Antimicrobials: Anti-infectives (From admission, onward)    Start     Dose/Rate Route Frequency Ordered Stop   06/21/22 2200  azithromycin (ZITHROMAX) tablet 500 mg        500 mg Oral Daily at bedtime 06/21/22 1405 06/24/22 2159   06/21/22 1000  azithromycin (ZITHROMAX) tablet 250 mg  Status:  Discontinued       See Hyperspace for full Linked Orders Report.   250 mg Oral Daily 06/19/22 2238 06/19/22 2245   06/21/22 0200  azithromycin (ZITHROMAX) 500 mg in sodium chloride 0.9 % 250 mL IVPB  Status:  Discontinued        500 mg 250 mL/hr over 60 Minutes Intravenous Every 24  hours 06/20/22 1338 06/21/22 1405   06/21/22 0000  cefTRIAXone (ROCEPHIN) 2 g in sodium chloride 0.9 % 100 mL IVPB        2 g 200 mL/hr over 30 Minutes Intravenous Every 24 hours 06/20/22 1338 06/24/22 2159   06/20/22 1000  azithromycin (ZITHROMAX) tablet 500 mg  Status:  Discontinued       See Hyperspace for full Linked Orders Report.   500 mg Oral Daily 06/19/22 2238 06/19/22 2245   06/19/22 2300  azithromycin (ZITHROMAX) 500 mg in sodium chloride 0.9 % 250 mL IVPB        500 mg 250 mL/hr over 60 Minutes Intravenous  Once 06/19/22 2245 06/20/22 0334   06/19/22 2245  cefTRIAXone (ROCEPHIN) 2 g in sodium chloride 0.9 % 100 mL IVPB        2 g 200 mL/hr over 30 Minutes Intravenous  Once 06/19/22 2238 06/20/22 0212        I have personally reviewed the following labs and images: CBC: Recent Labs  Lab 06/19/22 2128 06/19/22 2135 06/21/22 0043 06/21/22 0447 06/21/22 0937 06/21/22 0944 06/22/22 0255  06/23/22 0343  WBC 15.7*  --  9.9 10.7*  --   --  9.3 10.0  NEUTROABS 13.4*  --   --   --   --   --   --   --   HGB 12.6*   < > 11.2* 11.3* 11.9* 11.6* 11.2* 11.2*  HCT 42.0   < > 36.0* 37.1* 35.0* 34.0* 35.7* 35.9*  MCV 89.7  --  84.7 85.7  --   --  85.4 84.3  PLT 336  --  269 272  --   --  267 296   < > = values in this interval not displayed.   BMP &GFR Recent Labs  Lab 06/20/22 0842 06/21/22 0043 06/21/22 0447 06/21/22 0937 06/21/22 0944 06/21/22 1547 06/22/22 0255 06/23/22 0343  NA  --  136 134* 139 137 138 137 136  K  --  3.8 3.9 4.0 3.9 4.2 4.3 3.9  CL  --  103 100  --   --  103 102 100  CO2  --  23 22  --   --  _0 GLUCOSE  --  116* 111*  --   --  114* 117* 117*  BUN  --  26* 24*  --   --  29* 31* 29*  CREATININE  --  1.71* 1.55*  --   --  1.63* 1.65* 1.55*  CALCIUM  --  8.5* 8.5*  --   --  8.8* 8.6* 8.6*  MG 2.2 1.9  --   --   --  2.5*  --  2.1  PHOS  --  3.3  --   --   --   --   --  3.9   Estimated Creatinine Clearance: 56.1 mL/min (A) (by C-G formula based on SCr of 1.55 mg/dL (H)). Liver & Pancreas: Recent Labs  Lab 06/20/22 0005 06/20/22 0150 06/23/22 0343  AST 43* 58* 22  ALT _1 ALKPHOS 60 60 51  BILITOT 0.4 0.6 0.6  PROT 7.3 7.4 6.9  ALBUMIN 3.1* 3.3* 2.8*   No results for input(s): "LIPASE", "AMYLASE" in the last 168 hours. No results for input(s): "AMMONIA" in the last 168 hours. Diabetic: No results for input(s): "HGBA1C" in the last 72 hours. Recent Labs  Lab 06/20/22 0849 06/20/22 1149 06/20/22 1646  GLUCAP 91 90 104*  Cardiac Enzymes: No results for input(s): "CKTOTAL", "CKMB", "CKMBINDEX", "TROPONINI" in the last 168 hours. No results for input(s): "PROBNP" in the last 8760 hours. Coagulation Profile: No results for input(s): "INR", "PROTIME" in the last 168 hours. Thyroid Function Tests: No results for input(s): "TSH", "T4TOTAL", "FREET4", "T3FREE", "THYROIDAB" in the last 72 hours.  Lipid Profile: Recent Labs     06/22/22 0255  CHOL 171  HDL 31*  LDLCALC 128*  TRIG 62  CHOLHDL 5.5   Anemia Panel: No results for input(s): "VITAMINB12", "FOLATE", "FERRITIN", "TIBC", "IRON", "RETICCTPCT" in the last 72 hours. Urine analysis: No results found for: "COLORURINE", "APPEARANCEUR", "LABSPEC", "PHURINE", "GLUCOSEU", "HGBUR", "BILIRUBINUR", "KETONESUR", "PROTEINUR", "UROBILINOGEN", "NITRITE", "LEUKOCYTESUR" Sepsis Labs: Invalid input(s): "PROCALCITONIN", "LACTICIDVEN"  Microbiology: Recent Results (from the past 240 hour(s))  Resp Panel by RT-PCR (Flu A&B, Covid) Anterior Nasal Swab     Status: None   Collection Time: 06/20/22  1:48 PM   Specimen: Anterior Nasal Swab  Result Value Ref Range Status   SARS Coronavirus 2 by RT PCR NEGATIVE NEGATIVE Final    Comment: (NOTE) SARS-CoV-2 target nucleic acids are NOT DETECTED.  The SARS-CoV-2 RNA is generally detectable in upper respiratory specimens during the acute phase of infection. The lowest concentration of SARS-CoV-2 viral copies this assay can detect is 138 copies/mL. A negative result does not preclude SARS-Cov-2 infection and should not be used as the sole basis for treatment or other patient management decisions. A negative result may occur with  improper specimen collection/handling, submission of specimen other than nasopharyngeal swab, presence of viral mutation(s) within the areas targeted by this assay, and inadequate number of viral copies(<138 copies/mL). A negative result must be combined with clinical observations, patient history, and epidemiological information. The expected result is Negative.  Fact Sheet for Patients:  EntrepreneurPulse.com.au  Fact Sheet for Healthcare Providers:  IncredibleEmployment.be  This test is no t yet approved or cleared by the [place] FDA and  has been authorized for detection and/or diagnosis of SARS-CoV-2 by FDA under an Emergency Use Authorization  (EUA). This EUA will remain  in effect (meaning this test can be used) for the duration of the COVID-19 declaration under Section 564(b)(1) of the Act, 21 U.S.C.section 360bbb-3(b)(1), unless the authorization is terminated  or revoked sooner.       Influenza A by PCR NEGATIVE NEGATIVE Final   Influenza B by PCR NEGATIVE NEGATIVE Final    Comment: (NOTE) The Xpert Xpress SARS-CoV-2/FLU/RSV plus assay is intended as an aid in the diagnosis of influenza from Nasopharyngeal swab specimens and should not be used as a sole basis for treatment. Nasal washings and aspirates are unacceptable for Xpert Xpress SARS-CoV-2/FLU/RSV testing.  Fact Sheet for Patients: EntrepreneurPulse.com.au  Fact Sheet for Healthcare Providers: IncredibleEmployment.be  This test is not yet approved or cleared by the [place] FDA and has been authorized for detection and/or diagnosis of SARS-CoV-2 by FDA under an Emergency Use Authorization (EUA). This EUA will remain in effect (meaning this test can be used) for the duration of the COVID-19 declaration under Section 564(b)(1) of the Act, 21 U.S.C. section 360bbb-3(b)(1), unless the authorization is terminated or revoked.  Performed at [facility], [place] [address], [place], [place] [address]   MRSA Next Gen by PCR, Nasal     Status: None   Collection Time: 06/20/22  1:49 PM   Specimen: Anterior Nasal Swab  Result Value Ref Range Status   MRSA by PCR Next Gen NOT DETECTED NOT DETECTED Final  Comment: (NOTE) The GeneXpert MRSA Assay (FDA approved for NASAL specimens only), is one component of a comprehensive MRSA colonization surveillance program. It is not intended to diagnose MRSA infection nor to guide or monitor treatment for MRSA infections. Test performance is not FDA approved in patients less than 55 years old. Performed at [facility], [place] [address], [place], [place] [address]   Culture, blood (Routine X 2) w Reflex to ID Panel     Status: None (Preliminary result)   Collection Time: 06/22/22  6:49 PM   Specimen: BLOOD RIGHT ARM  Result Value Ref Range Status   Specimen Description   Final    BLOOD RIGHT ARM Performed at [facility], [place] [address] [place], [place] [address]    Special Requests   Final    BOTTLES DRAWN AEROBIC AND ANAEROBIC Blood Culture adequate volume Performed at [place] [address] [place], [place] [address]    Culture   Final    NO GROWTH < 12 HOURS Performed at [place] [address], [place], [place] [address]    Report Status PENDING  Incomplete  Culture, blood (Routine X 2) w Reflex to ID Panel     Status: None (Preliminary result)   Collection Time: 06/22/22  6:50 PM   Specimen: BLOOD RIGHT HAND  Result Value Ref Range Status   Specimen Description   Final    BLOOD RIGHT HAND Performed at [facility], [place] [address] [place], [place] [address]    Special Requests   Final    BOTTLES DRAWN AEROBIC ONLY Blood Culture adequate volume Performed at [place] [address], [place],  [address]    Culture   Final    NO GROWTH < 12 HOURS Performed at [facility] [address] [place],  [address]    Report Status PENDING  Incomplete    Radiology Studies: No results found.    Izzak Fries T. Bollinger  If 7PM-7AM, please contact night-coverage [email or web address] 06/23/2022, 2:23 PM

## 2022-06-23 NOTE — Progress Notes (Addendum)
Rounding Note    Patient Name: Troy Singh Date of Encounter: 06/23/2022  [facility] Cardiologist: None   Subjective   Cath this admit  showed mild non obstructive CAD with EF 20% pRCA, 40% pLAD and 20% ostial to mid LM with elevated right and left heart pressures>>c/w NICM.  No CP or SOB today  Inpatient Medications    Scheduled Meds:  amLODipine  5 mg Oral Daily   aspirin EC  81 mg Oral Daily   atorvastatin  40 mg Oral Daily   azithromycin  500 mg Oral QHS   carvedilol  3.125 mg Oral BID WC   colchicine  0.6 mg Oral Daily   enoxaparin (LOVENOX) injection  40 mg Subcutaneous Daily   furosemide  40 mg Intravenous BID   hydrALAZINE  25 mg Oral Q8H   isosorbide mononitrate  30 mg Oral Daily   mouth rinse  15 mL Mouth Rinse 4 times per day   sodium chloride flush  3 mL Intravenous Q12H   sodium chloride flush  3 mL Intravenous Q12H   Continuous Infusions:  sodium chloride     cefTRIAXone (ROCEPHIN)  IV 2 g (06/22/22 2116)   PRN Meds: sodium chloride, acetaminophen, docusate sodium, hydrALAZINE, mouth rinse, polyethylene glycol, sodium chloride flush   Vital Signs    Vitals:   06/23/22 0257 06/23/22 0304 06/23/22 0421 06/23/22 0600  BP: (!) 123/55  98/67 135/71  Pulse: (!) 59  64   Resp: 18  17   Temp: 99.6 F (37.6 C)  100.3 F (37.9 C) 99.6 F (37.6 C)  TempSrc: Oral  Oral   SpO2: 94%  96%   Weight:  92.7 kg    Height:        Intake/Output Summary (Last 24 hours) at 06/23/2022 0857 Last data filed at 06/22/2022 2300 Gross per 24 hour  Intake 240 ml  Output 1725 ml  Net -1485 ml       06/23/2022    3:04 AM 06/22/2022    5:00 AM 06/19/2022    9:22 PM  Last 3 Weights  Weight (lbs) 204 lb 5.9 oz 212 lb 8.4 oz 229 lb 15 oz  Weight (kg) 92.7 kg 96.4 kg 104.3 kg      Telemetry    NSR with NSVT up to 6 beats- Personally Reviewed  ECG    No new EKG to review - Personally Reviewed  Physical Exam   GEN: Well nourished, well  developed in no acute distress HEENT: Normal NECK: No JVD; No carotid bruits LYMPHATICS: No lymphadenopathy CARDIAC:RRR, no murmurs, rubs, gallops RESPIRATORY:  Clear to auscultation without rales, wheezing or rhonchi  ABDOMEN: Soft, non-tender, non-distended MUSCULOSKELETAL:  No edema; No deformity  SKIN: Warm and dry NEUROLOGIC:  Alert and oriented x 3 PSYCHIATRIC:  Normal affect  Labs    High Sensitivity Troponin:   Recent Labs  Lab 06/19/22 2128 06/20/22 0150 06/20/22 0842 06/20/22 1000 06/20/22 1846  TROPONINIHS 28* 1,937* 4,928* 4,835* 4,637*      Chemistry Recent Labs  Lab 06/20/22 0005 06/20/22 0150 06/20/22 0840 06/21/22 0043 06/21/22 0447 06/21/22 1547 06/22/22 0255 06/23/22 0343  NA 137 140   < > 136   < > 138 137 136  K 3.8 4.5   < > 3.8   < > 4.2 4.3 3.9  CL 106 109   < > 103   < > 103 102 100  CO2 26 23   < > 23   < >  24 23 25   GLUCOSE 87 96   < > 116*   < > 114* 117* 117*  BUN 25* 25*   < > 26*   < > 29* 31* 29*  CREATININE 1.67* 1.56*   < > 1.71*   < > 1.63* 1.65* 1.55*  CALCIUM 8.8* 9.0   < > 8.5*   < > 8.8* 8.6* 8.6*  MG  --   --    < > 1.9  --  2.5*  --  2.1  PROT 7.3 7.4  --   --   --   --   --  6.9  ALBUMIN 3.1* 3.3*  --   --   --   --   --  2.8*  AST 43* 58*  --   --   --   --   --  22  ALT 18 20  --   --   --   --   --  19  ALKPHOS 60 60  --   --   --   --   --  51  BILITOT 0.4 0.6  --   --   --   --   --  0.6  GFRNONAA 49* 53*   < > 48*   < > 50* 50* 54*  ANIONGAP 5 8   < > 10   < > 11 12 11    < > = values in this interval not displayed.     Lipids  Recent Labs  Lab 06/22/22 0255  CHOL 171  TRIG 62  HDL 31*  LDLCALC 128*  CHOLHDL 5.5     Hematology Recent Labs  Lab 06/21/22 0447 06/21/22 0937 06/21/22 0944 06/22/22 0255 06/23/22 0343  WBC 10.7*  --   --  9.3 10.0  RBC 4.33  --   --  4.18* 4.26  HGB 11.3*   < > 11.6* 11.2* 11.2*  HCT 37.1*   < > 34.0* 35.7* 35.9*  MCV 85.7  --   --  85.4 84.3  MCH 26.1  --   --   26.8 26.3  MCHC 30.5  --   --  31.4 31.2  RDW 15.8*  --   --  15.8* 15.4  PLT 272  --   --  267 296   < > = values in this interval not displayed.    Thyroid  Recent Labs  Lab 06/20/22 0842  TSH 1.260     BNP Recent Labs  Lab 06/19/22 2123 06/23/22 0343  BNP 459.4* 320.2*     DDimer No results for input(s): "DDIMER" in the last 168 hours.   Radiology    CARDIAC CATHETERIZATION  Result Date: 06/21/2022   Prox RCA lesion is 20% stenosed.   Prox LAD lesion is 40% stenosed.   Ost LM to Mid LM lesion is 20% stenosed. Mild non-obstructive CAD Elevated right and left heart pressures Non-ischemic cardiomyopathy Recommendations: Medical management of mild CAD. He will need further diuresis. Will resume IV Lasix. Follow renal function closely. GDMT as tolerated.    Cardiac Studies   Echocardiogram 06/20/22 1. Left ventricular ejection fraction, by estimation, is 30 to 35%. The  left ventricle has moderately decreased function. The left ventricle  demonstrates global hypokinesis. The left ventricular internal cavity size  was moderately dilated. Left  ventricular diastolic parameters are consistent with Grade III diastolic  dysfunction (restrictive).   2. Right ventricular systolic function is normal. The right ventricular  size is normal. Tricuspid  regurgitation signal is inadequate for assessing  PA pressure.   3. The mitral valve is normal in structure. No evidence of mitral valve  regurgitation. No evidence of mitral stenosis.   4. The aortic valve is normal in structure. Aortic valve regurgitation is  not visualized. No aortic stenosis is present.   5. The inferior vena cava is normal in size with <50% respiratory  variability, suggesting right atrial pressure of 8 mmHg.   Comparison(s): No prior Echocardiogram.   Patient Profile     51 y.o. male   Assessment & Plan    Acute on Chronic Combined Systolic and Diastolic Heart Failure  - Echo this admission showed EF  30-35%, grade III diastolic dysfunction, normal RV systolic function  - Patient presented complaining of sudden SOB while driving. BNP elevated to 459. CXR with cardiomegaly and bilateral airspace disease that could reflect edema vs infection  -LHC with mild non obstructive CAD>>suspect his DCM is related to HTN -Cath 12/8 with elevated filling pressures and severe PHTN with PAP 74/48mmHg, RA , LVEDP -now on lasix 40mg  IV BID -he put out 1.725L yesterday and is net neg 3.1L since admit -SCr improved from 1.65>>1.55 today -given AKI on CKD, will hold on starting ACE/ARB/ARNi/MRA -I think he is still volume overloaded so will continue Lasix IV -continue to follow strict I&O's, daily weights and renal function closely -continue Hydralazine 25mg  TID, Imdur 30mg  daily, Carvedilol 3.125mg  BID and Jardiance 10mg  daily -wean off amlodipine to allow BP room to titrate Hydralazine and Carvedilol -if renal function improves may be able to add Entresto and MRA -Given grade III diastolic dysfunction and AKI on CKD, PYP scan has been scheduled for tomorrow to rule out cardiac amyloid  Elevated Troponin  NSTEMI  ASCAD - hsTn [phone] - Patient reports that he has been having shortness of breath and chest tightness on exertion for the past year. He has multiple risk factors for CAD including tobacco use, type 2 DM, HTN  - LHC 12/8 with mild non obstructive CAD - sx likely related to poor BP control - Continue ASA, lipitor   HTN Urgency  - BP much improved today at 135/55mmHg - continue Hydralazine to 25mg  TID, Imdur 30mg  daily, Carvedilol 3.125mg  BID - will wean off amlodipine to allow BP room to titrate GDMT with Hydralazine and Carvedilol  - if renal function significantly improves, may be able to add Entresto and MRA  AKI on CKD stage IIIa - Closely monitoring renal function post cath and with diuresis - Avoiding nephrotoxic medications  - SCR improved from  1.65>>1.55  HLD -LDL goal < 70 -LDL this admit 128 -started on Atorvastatin 40mg  daily -will need FLP and ALT In 6 weeks   I have spent a total of with patient reviewing cath , telemetry, EKGs, labs and examining patient as well as establishing an assessment and plan that was discussed with the patient.  > 50% of time was spent in direct patient care.    For questions or updates, please contact [facility] Please consult [email or web address] for contact info under        Signed, [phone], MD  06/23/2022, 8:57 AM

## 2022-06-23 NOTE — Evaluation (Signed)
Physical Therapy One Time Evaluation Patient Details Name: Troy Singh MRN: [id number] DOB: 1971/06/14 Today's Date: 06/23/2022  History of Present Illness  51 year old M with PMH of DM-2, HTN, obesity, former tobacco use and noncompliance with medication presenting with shortness of breath, and admitted for acute respiratory failure with hypoxia in the setting of acute combined CHF, non-STEMI, hypertensive emergency, CAP and AKI.  Clinical Impression  Patient evaluated by Physical Therapy with no further acute PT needs identified. All education has been completed and the patient has no further questions.  See below for any follow-up Physical Therapy or equipment needs. PT is signing off. Thank you for this referral.      Recommendations for follow up therapy are one component of a multi-disciplinary discharge planning process, led by the attending physician.  Recommendations may be updated based on patient status, additional functional criteria and insurance authorization.  Follow Up Recommendations No PT follow up      Assistance Recommended at Discharge None  Patient can return home with the following       Equipment Recommendations None recommended by PT  Recommendations for Other Services       Functional Status Assessment Patient has not had a recent decline in their functional status     Precautions / Restrictions Precautions Precautions: None      Mobility  Bed Mobility Overal bed mobility: Independent                  Transfers Overall transfer level: Independent                      Ambulation/Gait Ambulation/Gait assistance: Independent Gait Distance (Feet): 400 Feet Assistive device: None Gait Pattern/deviations:  (increased bil trunk sway but pt reports gait at baseline)       General Gait Details: no symptoms, HR in 80s, SPO2 91% on room air during ambulation (96% on room air upon therapist leaving room)  Stairs             Wheelchair Mobility    Modified Rankin (Stroke Patients Only)       Balance Overall balance assessment: No apparent balance deficits (not formally assessed)                                           Pertinent Vitals/Pain Pain Assessment Pain Assessment: No/denies pain    Home Living Family/patient expects to be discharged to:: Private residence     Type of Home: Apartment Home Access: Stairs to enter Entrance Stairs-Rails: Right Entrance Stairs-Number of Steps: 14     Home Equipment: None      Prior Function Prior Level of Function : Independent/Modified Independent                     Hand Dominance        Extremity/Trunk Assessment   Upper Extremity Assessment Upper Extremity Assessment: Overall WFL for tasks assessed    Lower Extremity Assessment Lower Extremity Assessment: Overall WFL for tasks assessed    Cervical / Trunk Assessment Cervical / Trunk Assessment: Normal  Communication   Communication: No difficulties  Cognition Arousal/Alertness: Awake/alert Behavior During Therapy: WFL for tasks assessed/performed Overall Cognitive Status: Within Functional Limits for tasks assessed  General Comments      Exercises     Assessment/Plan    PT Assessment Patient does not need any further PT services  PT Problem List         PT Treatment Interventions      PT Goals (Current goals can be found in the Care Plan section)  Acute Rehab PT Goals PT Goal Formulation: All assessment and education complete, DC therapy    Frequency       Co-evaluation               AM-PAC PT "6 Clicks" Mobility  Outcome Measure Help needed turning from your back to your side while in a flat bed without using bedrails?: None Help needed moving from lying on your back to sitting on the side of a flat bed without using bedrails?: None Help needed moving to and from a bed  to a chair (including a wheelchair)?: None Help needed standing up from a chair using your arms (e.g., wheelchair or bedside chair)?: None Help needed to walk in hospital room?: None Help needed climbing 3-5 steps with a railing? : None 6 Click Score: 24    End of Session   Activity Tolerance: Patient tolerated treatment well Patient left: in chair;with call bell/phone within reach Nurse Communication: Mobility status PT Visit Diagnosis: Difficulty in walking, not elsewhere classified (R26.2)    Time: 1212-1222 PT Time Calculation (min) (ACUTE ONLY): 10 min   Charges:   PT Evaluation $PT Eval Low Complexity: 1 Low         Troy Singh PT, DPT Physical Therapist Acute Rehabilitation Services Preferred contact method: Secure Chat Weekend Pager Only: [phone] Office: [phone]   [name] 06/23/2022, 3:35 PM

## 2022-06-24 ENCOUNTER — Ambulatory Visit (HOSPITAL_COMMUNITY)
Admit: 2022-06-24 | Discharge: 2022-06-24 | Disposition: A | Discharge status: Home / Self Care | Payer: Self-pay | Attending: Physician Assistant | Admitting: Physician Assistant

## 2022-06-24 ENCOUNTER — Inpatient Hospital Stay (HOSPITAL_COMMUNITY): Payer: Self-pay

## 2022-06-24 ENCOUNTER — Other Ambulatory Visit (HOSPITAL_COMMUNITY): Payer: Self-pay

## 2022-06-24 ENCOUNTER — Inpatient Hospital Stay (HOSPITAL_COMMUNITY)
Admit: 2022-06-24 | Discharge: 2022-06-24 | Disposition: A | Discharge status: Home / Self Care | Payer: Self-pay | Attending: Physician Assistant | Admitting: Physician Assistant

## 2022-06-24 DIAGNOSIS — I43 Cardiomyopathy in diseases classified elsewhere: Secondary | ICD-10-CM

## 2022-06-24 DIAGNOSIS — E854 Organ-limited amyloidosis: Secondary | ICD-10-CM | POA: Insufficient documentation

## 2022-06-24 DIAGNOSIS — I110 Hypertensive heart disease with heart failure: Secondary | ICD-10-CM | POA: Insufficient documentation

## 2022-06-24 DIAGNOSIS — Z87891 Personal history of nicotine dependence: Secondary | ICD-10-CM | POA: Insufficient documentation

## 2022-06-24 DIAGNOSIS — I509 Heart failure, unspecified: Secondary | ICD-10-CM | POA: Insufficient documentation

## 2022-06-24 DIAGNOSIS — E7800 Pure hypercholesterolemia, unspecified: Secondary | ICD-10-CM

## 2022-06-24 DIAGNOSIS — E119 Type 2 diabetes mellitus without complications: Secondary | ICD-10-CM | POA: Insufficient documentation

## 2022-06-24 LAB — RENAL FUNCTION PANEL
Albumin: 2.9 g/dL — ABNORMAL LOW (ref 3.5–5.0)
Anion gap: 13 (ref 5–15)
BUN: 34 mg/dL — ABNORMAL HIGH (ref 6–20)
CO2: 23 mmol/L (ref 22–32)
Calcium: 9.1 mg/dL (ref 8.9–10.3)
Chloride: 102 mmol/L (ref 98–111)
Creatinine, Ser: 1.87 mg/dL — ABNORMAL HIGH (ref 0.61–1.24)
GFR, Estimated: 43 mL/min — ABNORMAL LOW (ref >60)
Glucose, Bld: 110 mg/dL — ABNORMAL HIGH (ref 70–99)
Phosphorus: 4.9 mg/dL — ABNORMAL HIGH (ref 2.5–4.6)
Potassium: 3.9 mmol/L (ref 3.5–5.1)
Sodium: 138 mmol/L (ref 135–145)

## 2022-06-24 LAB — HEMOGLOBIN A1C
Hgb A1c MFr Bld: 6.1 % — ABNORMAL HIGH (ref 4.8–5.6)
Hgb A1c MFr Bld: 6.2 % — ABNORMAL HIGH (ref 4.8–5.6)
Mean Plasma Glucose: 128 mg/dL
Mean Plasma Glucose: 131 mg/dL

## 2022-06-24 LAB — CBC
HCT: 38 % — ABNORMAL LOW (ref 39.0–52.0)
Hemoglobin: 11.7 g/dL — ABNORMAL LOW (ref 13.0–17.0)
MCH: 26.3 pg (ref 26.0–34.0)
MCHC: 30.8 g/dL (ref 30.0–36.0)
MCV: 85.4 fL (ref 80.0–100.0)
Platelets: 326 10*3/uL (ref 150–400)
RBC: 4.45 MIL/uL (ref 4.22–5.81)
RDW: 15.4 % (ref 11.5–15.5)
WBC: 8.7 10*3/uL (ref 4.0–10.5)
nRBC: 0 % (ref 0.0–0.2)

## 2022-06-24 LAB — MAGNESIUM: Magnesium: 2.3 mg/dL (ref 1.7–2.4)

## 2022-06-24 LAB — BRAIN NATRIURETIC PEPTIDE: B Natriuretic Peptide: 377.2 pg/mL — ABNORMAL HIGH (ref 0.0–100.0)

## 2022-06-24 MED ORDER — TECHNETIUM TC 99M PYROPHOSPHATE
21.1000 | Freq: Once | INTRAVENOUS | Status: AC | PRN
  Administered 2022-06-24: 21.1 via INTRAVENOUS

## 2022-06-24 MED ORDER — PREDNISONE 20 MG PO TABS
30.0000 mg | ORAL_TABLET | Freq: Every day | ORAL | Status: AC
  Administered 2022-06-24 – 2022-06-26 (×3): 30 mg via ORAL
  Filled 2022-06-24 (×3): qty 1

## 2022-06-24 NOTE — Progress Notes (Signed)
PROGRESS NOTE  Troy Singh [id number] DOB: Apr 21, 1971   PCP: Patient, No Pcp Per  Patient is from: Home.  Independently ambulates at baseline.  DOA: 06/19/2022 LOS: 4  Chief complaints Chief Complaint  Patient presents with   Respiratory Distress     Brief Narrative / Interim history: 51 year old M with PMH of DM-2, HTN, obesity, former tobacco use and noncompliance with medication presenting with shortness of breath, and admitted for acute respiratory failure with hypoxia in the setting of acute combined CHF, non-STEMI, hypertensive emergency, CAP and AKI.  CXR showed cardiomegaly and bilateral airspace disease.  BNP 460.  Procalcitonin elevated.  Patient was started on CAP coverage, IV diuretics on BiPAP and admitted to ICU by pulmonology.  TTE with LVEF of 30 to 35%, GH, G3-DD.  R/LHC with nonobstructive CAD, NICM and elevated right and left heart pressures.  Eventually, his respiratory status improved and he was transferred to hospitalist service on 06/22/2022.  Nuclear scan suggesting transthyretin amyloidosis.  Cardiology following.  Subjective: Seen and examined earlier this morning.  No major events overnight of this morning.  Complains of left elbow pain and stiffness.  Denies chest pain, shortness of breath, GI or UTI symptoms.  Excellent urine output.  Objective: Vitals:   06/23/22 1325 06/23/22 2128 06/24/22 0452 06/24/22 1441  BP: 132/74 131/79 (!) 158/81 (!) 134/93  Pulse: 62 64 (!) 59 63  Resp: _0 Temp: 98 F (36.7 C) 98.6 F (37 C) 98.3 F (36.8 C) 99.3 F (37.4 C)  TempSrc: Oral Oral Oral Oral  SpO2: 95% 93% 96% 99%  Weight:      Height:        Examination:  GENERAL: No apparent distress.  Nontoxic. HEENT: MMM.  Vision and hearing grossly intact.  NECK: Supple.  No apparent JVD.  RESP:  No IWOB.  Fair aeration bilaterally. CVS:  RRR. Heart sounds normal.  ABD/GI/GU: BS+. Abd soft, NTND.  MSK/EXT:  Moves extremities.  Notable swelling just  below his left elbow.  No erythema.  Limited ROM with flexion. SKIN: no apparent skin lesion or wound NEURO: Awake and alert. Oriented appropriately.  No apparent focal neuro deficit. PSYCH: Calm. Normal affect.   Procedures:  12/8-R/LHC with nonobstructive CAD, NICM, increased right and left heart pressures.  Microbiology summarized: COVID-19 and influenza PCR nonreactive. MRSA PCR screen negative.  Blood cultures NGTD  Assessment and plan: Principal Problem:   Hypertensive emergency Active Problems:   Elevated troponin   DM type 2, goal HbA1c < 7% (HCC)   Acute congestive heart failure (HCC)   DCM (dilated cardiomyopathy) (HCC)   Acute combined systolic and diastolic heart failure (HCC)   Coronary artery disease due to lipid rich plaque   Pure hypercholesterolemia   AKI (acute kidney injury) ([place])   CAP (community acquired pneumonia)   At risk for sleep apnea   Gout flare   Morbid obesity (HCC)   NSVT (nonsustained ventricular tachycardia) (HCC)   Fever   Cardiac amyloidosis (HCC)  Acute respiratory failure with hypoxia: Documented desaturation to 87 and 88%.  Likely due to acute CHF, hypertensive emergency, non-STEMI and CAP.  Cannot rule out sleep apnea.  Respiratory failure resolved. -Treat treatable causes -Incentive spirometry, OOB, flutter valve  Acute combined CHF/cardiac amyloidosis/DCM: BNP elevated to 460. TTE with LVEF of 30 to 35%, GH, G3-DD.  R/LHC with nonobstructive CAD, NICM and elevated right and left heart pressures.  Nuclear scan suggests cardiac amyloidosis.  On IV Lasix.  Net -  4.7 L..  BP stable.  Creatinine slightly up. -Cardiology following.  On IV Lasix, Coreg, Imdur and Jardiance.  Weaning off amlodipine. -Strict intake and output -May need outpatient sleep study.  NSVT: About 6 runs of VT overnight.  Not symptomatic. -Continue telemetry monitoring -Optimize electrolytes -Coreg as above  Hypertensive emergency: BP  improved. -Cardiac/antihypertensive meds as above -Needs outpatient sleep study  Non-STEMI/nonobstructive CAD: R/LHC as above. -Cardiac meds as above -Continue aspirin and Lipitor  Community-acquired pneumonia: Pro-Cal elevated. -Continue ceftriaxone and azithromycin to complete 5 days course -Pulmonary toilet  AKI/CKD-3A: Unknown baseline but Cr 1.43 in 10/2021. Stable. Recent Labs    06/19/22 2135 06/20/22 0005 06/20/22 0150 06/20/22 0840 06/21/22 0043 06/21/22 0447 06/21/22 1547 06/22/22 0255 06/23/22 0343 06/24/22 0337  BUN 34* 25* 25* 24* 26* 24* 29* 31* 29* 34*  CREATININE 1.70* 1.67* 1.56* 1.42* 1.71* 1.55* 1.63* 1.65* 1.55* 1.87*  -Continue monitoring   Acute gout flare in bilateral elbows: Likely from diuretics.  Reports history of gout.  Uric acid elevated to 11.  CRP and ESR elevated.  Continues to endorse left elbow pain.  X-ray with soft tissue swelling, small elbow effusion and possible osteochondral defect of capitulum. -Continue colchicine -Add prednisone 30 mg for 3 days  At risk for sleep apnea -Needs outpatient sleep study  Noncompliance with medication: Patient has no PCP.  Not compliant with antihypertensive meds. -Counseled on the importance -TOC consult  Fever: Spiked fever to 101.8 the evening of 12/9.  Likely from gout.  Blood cultures NGTD.  Already on antibiotics for pneumonia.  No UTI symptoms.  No leukocytosis. -Continue monitoring  Morbid obesity Body mass index is 36.2 kg/m.          DVT prophylaxis:  enoxaparin (LOVENOX) injection 40 mg Start: 06/22/22 1500 SCDs Start: 06/20/22 0555  Code Status: Full code Family Communication: None at bedside Level of care: Telemetry Status is: Inpatient Remains inpatient appropriate because: Acute CHF and pneumonia   Final disposition: Likely home with home health Consultants:  Cardiology Pulmonology admitted patient  Sch Meds:  Scheduled Meds:  amLODipine  2.5 mg Oral Daily    aspirin EC  81 mg Oral Daily   atorvastatin  40 mg Oral Daily   carvedilol  3.125 mg Oral BID WC   colchicine  0.6 mg Oral Daily   empagliflozin  10 mg Oral Daily   enoxaparin (LOVENOX) injection  40 mg Subcutaneous Daily   furosemide  40 mg Intravenous BID   hydrALAZINE  25 mg Oral Q8H   isosorbide mononitrate  30 mg Oral Daily   mouth rinse  15 mL Mouth Rinse 4 times per day   predniSONE  30 mg Oral Q breakfast   sodium chloride flush  3 mL Intravenous Q12H   sodium chloride flush  3 mL Intravenous Q12H   Continuous Infusions:  sodium chloride     PRN Meds:.sodium chloride, acetaminophen, docusate sodium, hydrALAZINE, mouth rinse, polyethylene glycol, sodium chloride flush  Antimicrobials: Anti-infectives (From admission, onward)    Start     Dose/Rate Route Frequency Ordered Stop   06/21/22 2200  azithromycin (ZITHROMAX) tablet 500 mg        500 mg Oral Daily at bedtime 06/21/22 1405 06/23/22 2110   06/21/22 1000  azithromycin (ZITHROMAX) tablet 250 mg  Status:  Discontinued       See Hyperspace for full Linked Orders Report.   250 mg Oral Daily 06/19/22 2238 06/19/22 2245   06/21/22 0200  azithromycin (ZITHROMAX) 500  mg in sodium chloride 0.9 % 250 mL IVPB  Status:  Discontinued        500 mg 250 mL/hr over 60 Minutes Intravenous Every 24 hours 06/20/22 1338 06/21/22 1405   06/21/22 0000  cefTRIAXone (ROCEPHIN) 2 g in sodium chloride 0.9 % 100 mL IVPB        2 g 200 mL/hr over 30 Minutes Intravenous Every 24 hours 06/20/22 1338 06/23/22 2145   06/20/22 1000  azithromycin (ZITHROMAX) tablet 500 mg  Status:  Discontinued       See Hyperspace for full Linked Orders Report.   500 mg Oral Daily 06/19/22 2238 06/19/22 2245   06/19/22 2300  azithromycin (ZITHROMAX) 500 mg in sodium chloride 0.9 % 250 mL IVPB        500 mg 250 mL/hr over 60 Minutes Intravenous  Once 06/19/22 2245 06/20/22 0334   06/19/22 2245  cefTRIAXone (ROCEPHIN) 2 g in sodium chloride 0.9 % 100 mL IVPB         2 g 200 mL/hr over 30 Minutes Intravenous  Once 06/19/22 2238 06/20/22 0212        I have personally reviewed the following labs and images: CBC: Recent Labs  Lab 06/19/22 2128 06/19/22 2135 06/21/22 0043 06/21/22 0447 06/21/22 0937 06/21/22 0944 06/22/22 0255 06/23/22 0343 06/24/22 0337  WBC 15.7*  --  9.9 10.7*  --   --  9.3 10.0 8.7  NEUTROABS 13.4*  --   --   --   --   --   --   --   --   HGB 12.6*   < > 11.2* 11.3* 11.9* 11.6* 11.2* 11.2* 11.7*  HCT 42.0   < > 36.0* 37.1* 35.0* 34.0* 35.7* 35.9* 38.0*  MCV 89.7  --  84.7 85.7  --   --  85.4 84.3 85.4  PLT 336  --  269 272  --   --  267 296 326   < > = values in this interval not displayed.   BMP &GFR Recent Labs  Lab 06/20/22 0842 06/21/22 0043 06/21/22 0447 06/21/22 0937 06/21/22 0944 06/21/22 1547 06/22/22 0255 06/23/22 0343 06/24/22 0337  NA  --  136 134*   < > 137 138 137 136 138  K  --  3.8 3.9   < > 3.9 4.2 4.3 3.9 3.9  CL  --  103 100  --   --  103 102 100 102  CO2  --  23 22  --   --  _0 GLUCOSE  --  116* 111*  --   --  114* 117* 117* 110*  BUN  --  26* 24*  --   --  29* 31* 29* 34*  CREATININE  --  1.71* 1.55*  --   --  1.63* 1.65* 1.55* 1.87*  CALCIUM  --  8.5* 8.5*  --   --  8.8* 8.6* 8.6* 9.1  MG 2.2 1.9  --   --   --  2.5*  --  2.1 2.3  PHOS  --  3.3  --   --   --   --   --  3.9 4.9*   < > = values in this interval not displayed.   Estimated Creatinine Clearance: 46.5 mL/min (A) (by C-G formula based on SCr of 1.87 mg/dL (H)). Liver & Pancreas: Recent Labs  Lab 06/20/22 0005 06/20/22 0150 06/23/22 0343 06/24/22 0337  AST 43* 58* 22  --   ALT  _0 --   ALKPHOS 60 60 51  --   BILITOT 0.4 0.6 0.6  --   PROT 7.3 7.4 6.9  --   ALBUMIN 3.1* 3.3* 2.8* 2.9*   No results for input(s): "LIPASE", "AMYLASE" in the last 168 hours. No results for input(s): "AMMONIA" in the last 168 hours. Diabetic: Recent Labs    06/22/22 0252 06/23/22 0343  HGBA1C 6.1* 6.2*   Recent Labs   Lab 06/20/22 0849 06/20/22 1149 06/20/22 1646  GLUCAP 91 90 104*   Cardiac Enzymes: No results for input(s): "CKTOTAL", "CKMB", "CKMBINDEX", "TROPONINI" in the last 168 hours. No results for input(s): "PROBNP" in the last 8760 hours. Coagulation Profile: No results for input(s): "INR", "PROTIME" in the last 168 hours. Thyroid Function Tests: No results for input(s): "TSH", "T4TOTAL", "FREET4", "T3FREE", "THYROIDAB" in the last 72 hours.  Lipid Profile: Recent Labs    06/22/22 0255  CHOL 171  HDL 31*  LDLCALC 128*  TRIG 62  CHOLHDL 5.5   Anemia Panel: No results for input(s): "VITAMINB12", "FOLATE", "FERRITIN", "TIBC", "IRON", "RETICCTPCT" in the last 72 hours. Urine analysis: No results found for: "COLORURINE", "APPEARANCEUR", "LABSPEC", "PHURINE", "GLUCOSEU", "HGBUR", "BILIRUBINUR", "KETONESUR", "PROTEINUR", "UROBILINOGEN", "NITRITE", "LEUKOCYTESUR" Sepsis Labs: Invalid input(s): "PROCALCITONIN", "LACTICIDVEN"  Microbiology: Recent Results (from the past 240 hour(s))  Resp Panel by RT-PCR (Flu A&B, Covid) Anterior Nasal Swab     Status: None   Collection Time: 06/20/22  1:48 PM   Specimen: Anterior Nasal Swab  Result Value Ref Range Status   SARS Coronavirus 2 by RT PCR NEGATIVE NEGATIVE Final    Comment: (NOTE) SARS-CoV-2 target nucleic acids are NOT DETECTED.  The SARS-CoV-2 RNA is generally detectable in upper respiratory specimens during the acute phase of infection. The lowest concentration of SARS-CoV-2 viral copies this assay can detect is 138 copies/mL. A negative result does not preclude SARS-Cov-2 infection and should not be used as the sole basis for treatment or other patient management decisions. A negative result may occur with  improper specimen collection/handling, submission of specimen other than nasopharyngeal swab, presence of viral mutation(s) within the areas targeted by this assay, and inadequate number of viral copies(<138 copies/mL). A  negative result must be combined with clinical observations, patient history, and epidemiological information. The expected result is Negative.  Fact Sheet for Patients:  EntrepreneurPulse.com.au  Fact Sheet for Healthcare Providers:  IncredibleEmployment.be  This test is no t yet approved or cleared by the [place] FDA and  has been authorized for detection and/or diagnosis of SARS-CoV-2 by FDA under an Emergency Use Authorization (EUA). This EUA will remain  in effect (meaning this test can be used) for the duration of the COVID-19 declaration under Section 564(b)(1) of the Act, 21 U.S.C.section 360bbb-3(b)(1), unless the authorization is terminated  or revoked sooner.       Influenza A by PCR NEGATIVE NEGATIVE Final   Influenza B by PCR NEGATIVE NEGATIVE Final    Comment: (NOTE) The Xpert Xpress SARS-CoV-2/FLU/RSV plus assay is intended as an aid in the diagnosis of influenza from Nasopharyngeal swab specimens and should not be used as a sole basis for treatment. Nasal washings and aspirates are unacceptable for Xpert Xpress SARS-CoV-2/FLU/RSV testing.  Fact Sheet for Patients: EntrepreneurPulse.com.au  Fact Sheet for Healthcare Providers: IncredibleEmployment.be  This test is not yet approved or cleared by the [place] FDA and has been authorized for detection and/or diagnosis of SARS-CoV-2 by FDA under an Emergency Use Authorization (EUA). This EUA will remain  in effect (meaning this test can be used) for the duration of the COVID-19 declaration under Section 564(b)(1) of the Act, 21 U.S.C. section 360bbb-3(b)(1), unless the authorization is terminated or revoked.  Performed at [facility], [place] [address] [place], [place] [address]   MRSA Next Gen by PCR, Nasal     Status: None   Collection Time: 06/20/22  1:49 PM   Specimen: Anterior Nasal Swab  Result Value Ref  Range Status   MRSA by PCR Next Gen NOT DETECTED NOT DETECTED Final    Comment: (NOTE) The GeneXpert MRSA Assay (FDA approved for NASAL specimens only), is one component of a comprehensive MRSA colonization surveillance program. It is not intended to diagnose MRSA infection nor to guide or monitor treatment for MRSA infections. Test performance is not FDA approved in patients less than 87 years old. Performed at [facility], [place] [address] [place], [place] [address]   Culture, blood (Routine X 2) w Reflex to ID Panel     Status: None (Preliminary result)   Collection Time: 06/22/22  6:49 PM   Specimen: BLOOD RIGHT ARM  Result Value Ref Range Status   Specimen Description   Final    BLOOD RIGHT ARM Performed at [facility], [place] [address], [place], [place] [address]    Special Requests   Final    BOTTLES DRAWN AEROBIC AND ANAEROBIC Blood Culture adequate volume Performed at [facility] [address] [place], [place] [address]    Culture   Final    NO GROWTH 2 DAYS Performed at [facility] [address] [place], [place] [address]    Report Status PENDING  Incomplete  Culture, blood (Routine X 2) w Reflex to ID Panel     Status: None (Preliminary result)   Collection Time: 06/22/22  6:50 PM   Specimen: BLOOD RIGHT HAND  Result Value Ref Range Status   Specimen Description   Final    BLOOD RIGHT HAND Performed at [facility], [place] [address] [place], [place] [address]    Special Requests   Final    BOTTLES DRAWN AEROBIC ONLY Blood Culture adequate volume Performed at [facility] [address] [place], [place] [address]    Culture   Final    NO GROWTH 2 DAYS Performed at [facility] [address], [place], [place] [address]    Report Status PENDING  Incomplete    Radiology Studies: NM CARDIAC AMYLOID TUMOR LOC INFLAM SPECT 1 DAY  Result Date: 06/24/2022 CLINICAL DATA:   HEART FAILURE. CONCERN FOR CARDIAC AMYLOIDOSIS. History type II diabetes mellitus, hypertension, former smoker EXAM: NUCLEAR MEDICINE TUMOR LOCALIZATION. PYP CARDIAC AMYLOIDOSIS SCAN WITH SPECT TECHNIQUE: Following intravenous administration of radiopharmaceutical, anterior planar images of the chest were obtained. Regions of interest were placed on the heart and contralateral chest wall for quantitative assessment. Additional SPECT imaging of the chest was obtained. RADIOPHARMACEUTICALS:  21.1 mCi TC- 64mPYROPHOSPHATE IV FINDINGS: Planar Visual assessment: Anterior planar imaging demonstrates radiotracer uptake within the heart equal to uptake within the adjacent ribs (Grade 2). Quantitative assessment : Quantitative assessment of the cardiac uptake compared to the contralateral chest wall is equal to 1.19 (H/CL = 1.19). SPECT assessment: SPECT imaging of the chest demonstrates clear radiotracer accumulation within the LEFT ventricle. IMPRESSION: Visual and quantitative assessment (grade 2, H/CL = 1.19) is strongly suggestive of transthyretin amyloidosis. Electronically Signed   By: MElta Guadeloupe  Thornton Papas M.D.   On: 06/24/2022 14:32   DG Elbow 2 Views Left  Result Date: 06/24/2022 CLINICAL DATA:  Elbow pain, left EXAM: LEFT ELBOW - 2 VIEW COMPARISON:  None Available. FINDINGS: There is an elbow joint effusion and generalized soft tissue swelling, most prominent posteriorly. There is ulnar trochlear and radiocapitellar osteophyte formation. No acute fracture identified. Small olecranon enthesophyte. Possible osteochondral defect of the capitellum. IMPRESSION: Soft tissue swelling of the elbow most prominent posteriorly. Small elbow joint effusion, which could be related to occult trauma or arthritis. Possible osteochondral defect of the capitellum. Electronically Signed   By: Maurine Simmering M.D.   On: 06/24/2022 11:30      Meredith Kilbride T. Schlater  If 7PM-7AM, please contact  night-coverage [email or web address] 06/24/2022, 2:56 PM

## 2022-06-24 NOTE — Progress Notes (Signed)
OT Cancellation Note and Discharge from OT  Patient Details Name: Trentin Knappenberger MRN: [id number] DOB: 30-Jan-1971   Cancelled Treatment:    Reason Eval/Treat Not Completed: OT screened, no needs identified, will sign off (Per PT, Pt independent)  Evern Bio Nishtha Raider 06/24/2022, 8:14 AM  Nyoka Cowden OTR/L Acute Rehabilitation Services Office: [phone]

## 2022-06-24 NOTE — Progress Notes (Incomplete)
Heart Failure Nurse Navigator Progress Note  PCP: Patient, No Pcp Per PCP-Cardiologist: None Admission Diagnosis: Acute respiratory failure.  Admitted from: [facility]  Presentation:   Troy Singh presented with acute respiratory failure, requiring BiPAp. Hypertensive emergency.   ECHO/ LVEF: 30-35% G3DD  Clinical Course:  Past Medical History:  Diagnosis Date   Diabetes mellitus without complication (HCC)    Gout    Hypertension      Social History   Socioeconomic History   Marital status: Single    Spouse name: Not on file   Number of children: Not on file   Years of education: Not on file   Highest education level: Not on file  Occupational History   Not on file  Tobacco Use   Smoking status: Every Day    Packs/day: 1.00    Types: Cigarettes   Smokeless tobacco: Never  Vaping Use   Vaping Use: Never used  Substance and Sexual Activity   Alcohol use: No    Alcohol/week: 0.0 standard drinks of alcohol   Drug use: No   Sexual activity: Yes  Other Topics Concern   Not on file  Social History Narrative   Not on file   Social Determinants of Health   Financial Resource Strain: Not on file  Food Insecurity: No Food Insecurity (06/20/2022)   Hunger Vital Sign    Worried About Running Out of Food in the Last Year: Never true    Ran Out of Food in the Last Year: Never true  Transportation Needs: No Transportation Needs (06/20/2022)   PRAPARE - Administrator, Civil Service (Medical): No    Lack of Transportation (Non-Medical): No  Physical Activity: Not on file  Stress: Not on file  Social Connections: Not on file    High Risk Criteria for Readmission and/or Poor Patient Outcomes: Heart failure hospital admissions (last 6 months): 0  No Show rate: *** Difficult social situation: No Ins Demonstrates medication adherence: *** Primary Language: *** Literacy level: ***  Barriers of Care:   ***  Considerations/Referrals:   Referral  made to Heart Failure Pharmacist Stewardship: *** Referral made to Heart Failure CSW/NCM TOC: *** Referral made to Heart & Vascular TOC clinic: ***  Items for Follow-up on DC/TOC: ***   ***

## 2022-06-25 ENCOUNTER — Other Ambulatory Visit (HOSPITAL_COMMUNITY): Payer: Self-pay

## 2022-06-25 LAB — RENAL FUNCTION PANEL
Albumin: 3.3 g/dL — ABNORMAL LOW (ref 3.5–5.0)
Anion gap: 11 (ref 5–15)
BUN: 40 mg/dL — ABNORMAL HIGH (ref 6–20)
CO2: 26 mmol/L (ref 22–32)
Calcium: 9.5 mg/dL (ref 8.9–10.3)
Chloride: 99 mmol/L (ref 98–111)
Creatinine, Ser: 1.57 mg/dL — ABNORMAL HIGH (ref 0.61–1.24)
GFR, Estimated: 53 mL/min — ABNORMAL LOW (ref >60)
Glucose, Bld: 171 mg/dL — ABNORMAL HIGH (ref 70–99)
Phosphorus: 5.2 mg/dL — ABNORMAL HIGH (ref 2.5–4.6)
Potassium: 4.6 mmol/L (ref 3.5–5.1)
Sodium: 136 mmol/L (ref 135–145)

## 2022-06-25 LAB — CBC
HCT: 40.2 % (ref 39.0–52.0)
Hemoglobin: 12.4 g/dL — ABNORMAL LOW (ref 13.0–17.0)
MCH: 26.1 pg (ref 26.0–34.0)
MCHC: 30.8 g/dL (ref 30.0–36.0)
MCV: 84.5 fL (ref 80.0–100.0)
Platelets: 384 10*3/uL (ref 150–400)
RBC: 4.76 MIL/uL (ref 4.22–5.81)
RDW: 15.2 % (ref 11.5–15.5)
WBC: 8.4 10*3/uL (ref 4.0–10.5)
nRBC: 0 % (ref 0.0–0.2)

## 2022-06-25 LAB — MAGNESIUM: Magnesium: 2.6 mg/dL — ABNORMAL HIGH (ref 1.7–2.4)

## 2022-06-25 MED ORDER — ATORVASTATIN CALCIUM 40 MG PO TABS
40.0000 mg | ORAL_TABLET | Freq: Every day | ORAL | 1 refills | Status: DC
  Filled 2022-06-25 – 2022-06-26 (×2): qty 30, 30d supply, fill #0

## 2022-06-25 MED ORDER — ALLOPURINOL 100 MG PO TABS
100.0000 mg | ORAL_TABLET | Freq: Every day | ORAL | 1 refills | Status: DC
  Filled 2022-06-25 – 2022-06-26 (×2): qty 30, 30d supply, fill #0
  Filled 2023-06-02: qty 30, 30d supply, fill #1
  Filled 2023-06-24: qty 30, 30d supply, fill #2

## 2022-06-25 MED ORDER — COLCHICINE 0.6 MG PO TABS
0.6000 mg | ORAL_TABLET | Freq: Every day | ORAL | 1 refills | Status: DC
  Filled 2022-06-25: qty 90, 90d supply, fill #0
  Filled 2022-06-26: qty 30, 30d supply, fill #0

## 2022-06-25 MED ORDER — PREDNISONE 10 MG PO TABS
30.0000 mg | ORAL_TABLET | Freq: Every day | ORAL | 0 refills | Status: DC
  Filled 2022-06-25: qty 9, 3d supply, fill #0

## 2022-06-25 MED ORDER — HYDRALAZINE HCL 25 MG PO TABS
25.0000 mg | ORAL_TABLET | Freq: Three times a day (TID) | ORAL | 1 refills | Status: DC
  Filled 2022-06-25 – 2022-06-26 (×2): qty 90, 30d supply, fill #0

## 2022-06-25 MED ORDER — EMPAGLIFLOZIN 10 MG PO TABS
10.0000 mg | ORAL_TABLET | Freq: Every day | ORAL | 1 refills | Status: AC
  Filled 2022-06-25: qty 90, 90d supply, fill #0
  Filled 2022-06-26 – 2022-07-26 (×2): qty 30, 30d supply, fill #0

## 2022-06-25 MED ORDER — ASPIRIN 81 MG PO TBEC: 81.0000 mg | DELAYED_RELEASE_TABLET | Freq: Every day | ORAL | 12 refills | Status: AC

## 2022-06-25 MED ORDER — FUROSEMIDE 40 MG PO TABS
40.0000 mg | ORAL_TABLET | Freq: Every day | ORAL | 1 refills | Status: DC
  Filled 2022-06-25 – 2022-06-26 (×2): qty 30, 30d supply, fill #0

## 2022-06-25 MED ORDER — CARVEDILOL 3.125 MG PO TABS
3.1250 mg | ORAL_TABLET | Freq: Two times a day (BID) | ORAL | 1 refills | Status: DC
  Filled 2022-06-25 – 2022-06-26 (×2): qty 60, 30d supply, fill #0

## 2022-06-25 MED ORDER — ISOSORBIDE MONONITRATE ER 30 MG PO TB24
30.0000 mg | ORAL_TABLET | Freq: Every day | ORAL | 1 refills | Status: DC
  Filled 2022-06-25 – 2022-06-26 (×2): qty 30, 30d supply, fill #0

## 2022-06-25 NOTE — Progress Notes (Signed)
His HR dropped to 32 but not sustaining. he was asymptomatic when I checked on him. He's now in the mid 40s. Garner Nash NP notified without any new order. Will continue to monitor.

## 2022-06-25 NOTE — Progress Notes (Signed)
Progress Note  Patient Name: Troy Singh Date of Encounter: 06/25/2022  Primary Cardiologist: None   Subjective   Patient seen and examined at his bedside.  Inpatient Medications    Scheduled Meds:  amLODipine  2.5 mg Oral Daily   aspirin EC  81 mg Oral Daily   atorvastatin  40 mg Oral Daily   carvedilol  3.125 mg Oral BID WC   colchicine  0.6 mg Oral Daily   empagliflozin  10 mg Oral Daily   enoxaparin (LOVENOX) injection  40 mg Subcutaneous Daily   furosemide  40 mg Intravenous BID   hydrALAZINE  25 mg Oral Q8H   isosorbide mononitrate  30 mg Oral Daily   mouth rinse  15 mL Mouth Rinse 4 times per day   predniSONE  30 mg Oral Q breakfast   sodium chloride flush  3 mL Intravenous Q12H   sodium chloride flush  3 mL Intravenous Q12H   Continuous Infusions:  sodium chloride     PRN Meds: sodium chloride, acetaminophen, docusate sodium, hydrALAZINE, mouth rinse, polyethylene glycol, sodium chloride flush   Vital Signs    Vitals:   06/24/22 1441 06/24/22 2127 06/25/22 0459 06/25/22 0500  BP: (!) 134/93 (!) 150/98 132/85   Pulse: 63 68 (!) 50   Resp: 20 16 18    Temp: 99.3 F (37.4 C) 98.9 F (37.2 C) 98.3 F (36.8 C)   TempSrc: Oral Oral Oral   SpO2: 99% 100% 99%   Weight:    92.8 kg  Height:        Intake/Output Summary (Last 24 hours) at 06/25/2022 1127 Last data filed at 06/25/2022 0200 Gross per 24 hour  Intake --  Output 950 ml  Net -950 ml   Filed Weights   06/22/22 0500 06/23/22 0304 06/25/22 0500  Weight: 96.4 kg 92.7 kg 92.8 kg    Telemetry    Sinus rhythm - Personally Reviewed  ECG    None today - Personally Reviewed  Physical Exam    General: Comfortable Head: Atraumatic, normal size  Eyes: PEERLA, EOMI  Neck: Supple, normal JVD Cardiac: Normal S1, S2; RRR; no murmurs, rubs, or gallops Lungs: Clear to auscultation bilaterally Abd: Soft, nontender, no hepatomegaly  Ext: warm, no edema Musculoskeletal: No deformities, BUE and  BLE strength normal and equal Skin: Warm and dry, no rashes   Neuro: Alert and oriented to person, place, time, and situation, CNII-XII grossly intact, no focal deficits  Psych: Normal mood and affect   Labs    Chemistry Recent Labs  Lab 06/20/22 0005 06/20/22 0150 06/20/22 0840 06/23/22 0343 06/24/22 0337 06/25/22 0348  NA 137 140   < > 136 138 136  K 3.8 4.5   < > 3.9 3.9 4.6  CL 106 109   < > 100 102 99  CO2 26 23   < > 25 23 26   GLUCOSE 87 96   < > 117* 110* 171*  BUN 25* 25*   < > 29* 34* 40*  CREATININE 1.67* 1.56*   < > 1.55* 1.87* 1.57*  CALCIUM 8.8* 9.0   < > 8.6* 9.1 9.5  PROT 7.3 7.4  --  6.9  --   --   ALBUMIN 3.1* 3.3*  --  2.8* 2.9* 3.3*  AST 43* 58*  --  22  --   --   ALT 18 20  --  19  --   --   ALKPHOS 60 60  --  51  --   --  BILITOT 0.4 0.6  --  0.6  --   --   GFRNONAA 49* 53*   < > 54* 43* 53*  ANIONGAP 5 8   < > 11 13 11    < > = values in this interval not displayed.     Hematology Recent Labs  Lab 06/23/22 0343 06/24/22 0337 06/25/22 0348  WBC 10.0 8.7 8.4  RBC 4.26 4.45 4.76  HGB 11.2* 11.7* 12.4*  HCT 35.9* 38.0* 40.2  MCV 84.3 85.4 84.5  MCH 26.3 26.3 26.1  MCHC 31.2 30.8 30.8  RDW 15.4 15.4 15.2  PLT 296 326 384    Cardiac EnzymesNo results for input(s): "TROPONINI" in the last 168 hours. No results for input(s): "TROPIPOC" in the last 168 hours.   BNP Recent Labs  Lab 06/19/22 2123 06/23/22 0343 06/24/22 0338  BNP 459.4* 320.2* 377.2*     DDimer No results for input(s): "DDIMER" in the last 168 hours.   Radiology    NM CARDIAC AMYLOID TUMOR LOC INFLAM SPECT 1 DAY  Result Date: 06/24/2022 CLINICAL DATA:  HEART FAILURE. CONCERN FOR CARDIAC AMYLOIDOSIS. History type II diabetes mellitus, hypertension, former smoker EXAM: NUCLEAR MEDICINE TUMOR LOCALIZATION. PYP CARDIAC AMYLOIDOSIS SCAN WITH SPECT TECHNIQUE: Following intravenous administration of radiopharmaceutical, anterior planar images of the chest were obtained.  Regions of interest were placed on the heart and contralateral chest wall for quantitative assessment. Additional SPECT imaging of the chest was obtained. RADIOPHARMACEUTICALS:  21.1 mCi TC- 38m PYROPHOSPHATE IV FINDINGS: Planar Visual assessment: Anterior planar imaging demonstrates radiotracer uptake within the heart equal to uptake within the adjacent ribs (Grade 2). Quantitative assessment : Quantitative assessment of the cardiac uptake compared to the contralateral chest wall is equal to 1.19 (H/CL = 1.19). SPECT assessment: SPECT imaging of the chest demonstrates clear radiotracer accumulation within the LEFT ventricle. IMPRESSION: Visual and quantitative assessment (grade 2, H/CL = 1.19) is strongly suggestive of transthyretin amyloidosis. Electronically Signed   By: Lavonia Dana M.D.   On: 06/24/2022 14:32   DG Elbow 2 Views Left  Result Date: 06/24/2022 CLINICAL DATA:  Elbow pain, left EXAM: LEFT ELBOW - 2 VIEW COMPARISON:  None Available. FINDINGS: There is an elbow joint effusion and generalized soft tissue swelling, most prominent posteriorly. There is ulnar trochlear and radiocapitellar osteophyte formation. No acute fracture identified. Small olecranon enthesophyte. Possible osteochondral defect of the capitellum. IMPRESSION: Soft tissue swelling of the elbow most prominent posteriorly. Small elbow joint effusion, which could be related to occult trauma or arthritis. Possible osteochondral defect of the capitellum. Electronically Signed   By: Maurine Simmering M.D.   On: 06/24/2022 11:30    Cardiac Studies   NM Cardiac Amyloid 06/25/2022 Narrative & Impression  CLINICAL DATA:  HEART FAILURE. CONCERN FOR CARDIAC AMYLOIDOSIS. History type II diabetes mellitus, hypertension, former smoker   EXAM: NUCLEAR MEDICINE TUMOR LOCALIZATION. PYP CARDIAC AMYLOIDOSIS SCAN WITH SPECT   TECHNIQUE: Following intravenous administration of radiopharmaceutical, anterior planar images of the chest were  obtained. Regions of interest were placed on the heart and contralateral chest wall for quantitative assessment. Additional SPECT imaging of the chest was obtained.   RADIOPHARMACEUTICALS:  21.1 mCi TC- 63m PYROPHOSPHATE IV   FINDINGS: Planar Visual assessment:   Anterior planar imaging demonstrates radiotracer uptake within the heart equal to uptake within the adjacent ribs (Grade 2).   Quantitative assessment :   Quantitative assessment of the cardiac uptake compared to the contralateral chest wall is equal to 1.19 (H/CL = 1.19).  SPECT assessment: SPECT imaging of the chest demonstrates clear radiotracer accumulation within the LEFT ventricle.   IMPRESSION: Visual and quantitative assessment (grade 2, H/CL = 1.19) is strongly suggestive of transthyretin amyloidosis.      Patient Profile     51 y.o. male with hx of mild non obstructive CAD with EF 20% pRCA, 40% pLAD and 20% ostial to mid LM with elevated right and left heart pressures>>c/w NICM.   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  He is euvolemic.  I would like to transition his Lasix to p.o.  Lasix 40 mg daily. In terms of his cardiomyopathy he has been started on: 30 medical therapy he is on carvedilol, isosorbide nitrate, hydralazine and Jardiance.  Renal function not yet improved to be able to add his Entresto and MRA.  He had the nuclear PYP study done yesterday showing evidence of transthyretin amyloidosis  Discussed these results with the patient.  Will refer him to our outpatient heart failure clinic to pursue treatment. No angina symptoms continue on his current medical regimen with aspirin  and statin. Kidney function slightly improved today we will continue to monitor. Blood pressure now has improved-good increase his carvedilol to 6.25 mg twice daily.    For questions or updates, please contact [facility] Please consult [email or web address] for contact info under Cardiology/STEMI.      Signed, Thomasene Ripple, DO  06/25/2022, 11:27 AM

## 2022-06-25 NOTE — Discharge Summary (Signed)
Physician Discharge Summary  Troy Singh KYH:[id number] DOB: 1971-05-04 DOA: 06/19/2022  PCP: Patient, No Pcp Per  Admit date: 06/19/2022 Discharge date: 06/25/2022 Admitted From: Home Disposition: Home Recommendations for Outpatient Follow-up:  Follow up with PCP as below Outpatient follow-up with cardiology as below Check CMP and CBC in 1 to 2 weeks Reassess fluid status and adjust cardiac meds as appropriate Please follow up on the following pending results: None  Home Health: Not indicated Equipment/Devices: Not indicated  Discharge Condition: Stable CODE STATUS: Full code  Follow-up Information     [name] Follow up in 22 day(s).   Specialty: Cardiology Why: Hospital follow up appoinment 07/16/2022 @ 2 pm PLEASE bring a current medication list to appointment [name], off [address]. Contact information: [address] [id number] [place] [phone]        Primary Care at [facility] Follow up in 2 day(s).   Specialty: Family Medicine Why: Hospital Follow up appointment is Friday December 15 at 10 am, you can apply for financial assistance when you establish Primary Care. Contact information: [address] [place] [phone]                Hospital course 51 year old M with PMH of DM-2, HTN, obesity, former tobacco use and noncompliance with medication presenting with shortness of breath, and admitted for acute respiratory failure with hypoxia in the setting of acute combined CHF, non-STEMI, hypertensive emergency, CAP and AKI.  CXR showed cardiomegaly and bilateral airspace disease.  BNP 460.  Procalcitonin elevated.  Patient was started on CAP coverage, IV diuretics on BiPAP and admitted to ICU by pulmonology.  TTE with LVEF of 30 to 35%, GH, G3-DD.  R/LHC with nonobstructive CAD, NICM and elevated right  and left heart pressures.  Eventually, his respiratory status improved and he was transferred to hospitalist service on 06/22/2022.  Nuclear scan suggests transthyretin amyloidosis.   The day of discharge, respiratory failure resolved, and he was cleared for discharge by cardiology.  Initially, cardiology recommended increasing Coreg to 6.25 mg twice daily.  After discussion about intermittent bradycardia and borderline heart rate, he was discharged on Coreg 3.125 mg twice daily.  He will be on p.o. Lasix 40 mg daily, hydralazine 25 mg 3 times daily, Imdur 30 mg daily.  Cardiology recommended Jardiance but patient has no insurance and out-of-pocket price is about $2000.  This has been brought to cardiologist attention.  Patient has been counseled on the importance of compliance with medication and sodium and fluid restriction and daily weight.   Patient completed CAP coverage with ceftriaxone and azithromycin prior to discharge.  Hospital course notable for acute gout flareup for which she has been started on colchicine and prednisone.  Will start allopurinol once he complete course of prednisone.  Patient's medications delivered to bedside prior to discharge except Jardiance.   See individual problem list below for more.   Problems addressed during this hospitalization Principal Problem:   Hypertensive emergency Active Problems:   Elevated troponin   DM type 2, goal HbA1c < 7% (HCC)   Acute congestive heart failure (HCC)   DCM (dilated cardiomyopathy) (HCC)   Acute combined systolic and diastolic heart failure (HCC)   Coronary artery disease due to lipid rich plaque   Pure hypercholesterolemia   AKI (acute kidney injury) ([place])   CAP (community acquired pneumonia)   At risk for sleep apnea  Gout flare   Morbid obesity (HCC)   NSVT (nonsustained ventricular tachycardia) (HCC)   Fever   Cardiac amyloidosis (HCC)   Acute respiratory failure with hypoxia: Documented desaturation to 87 and  88%.  Likely due to acute CHF, hypertensive emergency, non-STEMI and CAP.  Cannot rule out sleep apnea.  Respiratory failure resolved.   Acute combined CHF/cardiac amyloidosis/DCM: BNP elevated to 460. TTE with LVEF of 30 to 35%, GH, G3-DD.  R/LHC with nonobstructive CAD, NICM and elevated right and left heart pressures.  Nuclear scan suggests cardiac amyloidosis.  Diuresed with IV Lasix 40 mg twice daily.  I and O incomplete but net -6 L.  Discharge weight 204.6 pounds.  BP and creatinine stable. -Discharge on p.o. Lasix, low-dose Coreg, Imdur and hydralazine. -Unfortunately, patient was not able to afford Jardiance.  -Cardiology to arrange outpatient follow-up. -May need outpatient sleep study.   Intermittent bradycardia/NSVT: Not symptomatic. -Discharged on low-dose Coreg 3.125 mg twice daily after discussion with cardiology. -Consider outpatient sleep study   Hypertensive emergency: BP improved. -Cardiac/antihypertensive meds as above -Needs outpatient sleep study   Non-STEMI/nonobstructive CAD: R/LHC as above. -Cardiac meds as above -Continue aspirin and Lipitor   Community-acquired pneumonia: Pro-Cal elevated. -Completed 5 days of azithromycin and ceftriaxone in house.   AKI/CKD-3A: Unknown baseline but Cr 1.43 in 10/2021. Stable. Recent Labs    06/20/22 0005 06/20/22 0150 06/20/22 0840 06/21/22 0043 06/21/22 0447 06/21/22 1547 06/22/22 0255 06/23/22 0343 06/24/22 0337 06/25/22 0348  BUN 25* 25* 24* 26* 24* 29* 31* 29* 34* 40*  CREATININE 1.67* 1.56* 1.42* 1.71* 1.55* 1.63* 1.65* 1.55* 1.87* 1.57*  -Recheck renal function at follow-up.     Acute gout flare in bilateral elbows: Likely from diuretics.  Reports history of gout.  Uric acid elevated to 11.  CRP and ESR elevated.  Continues to endorse left elbow pain.  X-ray with soft tissue swelling, small elbow effusion and possible osteochondral defect of capitulum.  Swelling and pain improved after starting  prednisone. -Continue colchicine -P.o. prednisone 30 mg daily for 4 more days -Patient to start allopurinol after completing course of prednisone   At risk for sleep apnea -Needs outpatient sleep study   Noncompliance with medication: Patient has no PCP.  Not compliant with antihypertensive meds. -Counseled on the importance compliance with his medication and follow-up with PCP   Fever: Spiked fever to 101.8 the evening of 12/9.  Likely from gout.  Blood cultures NGTD.  Already on antibiotics for pneumonia.  No UTI symptoms.  No leukocytosis.  No further fever.   Morbid obesity Body mass index is 36.2 kg/m -Encourage lifestyle change to lose weight.   Vital signs Vitals:   06/24/22 2127 06/25/22 0459 06/25/22 0500 06/25/22 1200  BP: (!) 150/98 132/85  (!) 136/91  Pulse: 68 (!) 50  61  Temp: 98.9 F (37.2 C) 98.3 F (36.8 C)  97.8 F (36.6 C)  Resp: _0 Height:      Weight:   92.8 kg   SpO2: 100% 99%  100%  TempSrc: Oral Oral  Oral  BMI (Calculated):   36.25      Discharge exam  GENERAL: No apparent distress.  Nontoxic. HEENT: MMM.  Vision and hearing grossly intact.  NECK: Supple.  No apparent JVD.  RESP:  No IWOB.  Fair aeration bilaterally. CVS:  RRR. Heart sounds normal.  ABD/GI/GU: BS+. Abd soft, NTND.  MSK/EXT:  Moves extremities. No apparent deformity. No edema.  SKIN: no apparent skin  lesion or wound NEURO: Awake and alert. Oriented appropriately.  No apparent focal neuro deficit. PSYCH: Calm. Normal affect.   Discharge Instructions Discharge Instructions     (HEART FAILURE PATIENTS) Call MD:  Anytime you have any of the following symptoms: 1) 3 pound weight gain in 24 hours or 5 pounds in 1 week 2) shortness of breath, with or without a dry hacking cough 3) swelling in the hands, feet or stomach 4) if you have to sleep on extra pillows at night in order to breathe.   Complete by: As directed    Call MD for:  difficulty breathing, headache or visual  disturbances   Complete by: As directed    Call MD for:  persistant dizziness or light-headedness   Complete by: As directed    Diet - low sodium heart healthy   Complete by: As directed    Discharge instructions   Complete by: As directed    It has been a pleasure taking care of you!  You were hospitalized due to trouble breathing likely from heart failure and pneumonia.  You have been treated with antibiotics for pneumonia.  We have started you on medications for heart failure.  It is very important that you take your medications as prescribed.  Follow-up with cardiology per their recommendation.  In addition to taking your medications as prescribed, we also recommend you avoid alcohol or over-the-counter pain medication other than plain Tylenol, limit the amount of water/fluid you drink to less than 6 cups (1500 cc) a day,  limit your sodium (salt) intake to less than 2 g (2000 mg) a day and weigh yourself daily at the same time and keeping your weight log.     Take care,   Increase activity slowly   Complete by: As directed       Allergies as of 06/25/2022   No Known Allergies      Medication List     STOP taking these medications    amLODipine 2.5 MG tablet Commonly known as: NORVASC   levETIRAcetam 500 MG tablet Commonly known as: Keppra       TAKE these medications    acetaminophen 500 MG tablet Commonly known as: TYLENOL Take 1,000 mg by mouth every 6 (six) hours as needed for mild pain.   allopurinol 100 MG tablet Commonly known as: Zyloprim Take 1 tablet (100 mg total) by mouth daily. Start taking on: June 29, 2022   aspirin EC 81 MG tablet Take 1 tablet (81 mg total) by mouth daily. Swallow whole. Start taking on: June 26, 2022   atorvastatin 40 MG tablet Commonly known as: LIPITOR Take 1 tablet (40 mg total) by mouth daily. Start taking on: June 26, 2022   carvedilol 3.125 MG tablet Commonly known as: COREG Take 1 tablet (3.125 mg  total) by mouth 2 (two) times daily with a meal.   colchicine 0.6 MG tablet Take 1 tablet (0.6 mg total) by mouth daily. Start taking on: June 26, 2022   empagliflozin 10 MG Tabs tablet Commonly known as: JARDIANCE Take 1 tablet (10 mg total) by mouth daily. Start taking on: June 26, 2022   furosemide 40 MG tablet Commonly known as: Lasix Take 1 tablet (40 mg total) by mouth daily.   hydrALAZINE 25 MG tablet Commonly known as: APRESOLINE Take 1 tablet (25 mg total) by mouth every 8 (eight) hours.   isosorbide mononitrate 30 MG 24 hr tablet Commonly known as: IMDUR Take 1 tablet (30 mg total)  by mouth daily. Start taking on: June 26, 2022   predniSONE 10 MG tablet Commonly known as: DELTASONE Take 3 tablets (30 mg total) by mouth daily with breakfast for 3 days. Start taking on: June 26, 2022        Consultations: Pulmonology admitted patient. Cardiology  Procedures/Studies: 12/8-R/LHC with nonobstructive CAD, NICM, increased right and left heart pressures.    NM CARDIAC AMYLOID TUMOR LOC INFLAM SPECT 1 DAY  Result Date: 06/24/2022 CLINICAL DATA:  HEART FAILURE. CONCERN FOR CARDIAC AMYLOIDOSIS. History type II diabetes mellitus, hypertension, former smoker EXAM: NUCLEAR MEDICINE TUMOR LOCALIZATION. PYP CARDIAC AMYLOIDOSIS SCAN WITH SPECT TECHNIQUE: Following intravenous administration of radiopharmaceutical, anterior planar images of the chest were obtained. Regions of interest were placed on the heart and contralateral chest wall for quantitative assessment. Additional SPECT imaging of the chest was obtained. RADIOPHARMACEUTICALS:  21.1 mCi TC- 14mPYROPHOSPHATE IV FINDINGS: Planar Visual assessment: Anterior planar imaging demonstrates radiotracer uptake within the heart equal to uptake within the adjacent ribs (Grade 2). Quantitative assessment : Quantitative assessment of the cardiac uptake compared to the contralateral chest wall is equal to 1.19 (H/CL  = 1.19). SPECT assessment: SPECT imaging of the chest demonstrates clear radiotracer accumulation within the LEFT ventricle. IMPRESSION: Visual and quantitative assessment (grade 2, H/CL = 1.19) is strongly suggestive of transthyretin amyloidosis. Electronically Signed   By: MLavonia DanaM.D.   On: 06/24/2022 14:32   DG Elbow 2 Views Left  Result Date: 06/24/2022 CLINICAL DATA:  Elbow pain, left EXAM: LEFT ELBOW - 2 VIEW COMPARISON:  None Available. FINDINGS: There is an elbow joint effusion and generalized soft tissue swelling, most prominent posteriorly. There is ulnar trochlear and radiocapitellar osteophyte formation. No acute fracture identified. Small olecranon enthesophyte. Possible osteochondral defect of the capitellum. IMPRESSION: Soft tissue swelling of the elbow most prominent posteriorly. Small elbow joint effusion, which could be related to occult trauma or arthritis. Possible osteochondral defect of the capitellum. Electronically Signed   By: JMaurine SimmeringM.D.   On: 06/24/2022 11:30   CARDIAC CATHETERIZATION  Result Date: 06/21/2022   Prox RCA lesion is 20% stenosed.   Prox LAD lesion is 40% stenosed.   Ost LM to Mid LM lesion is 20% stenosed. Mild non-obstructive CAD Elevated right and left heart pressures Non-ischemic cardiomyopathy Recommendations: Medical management of mild CAD. He will need further diuresis. Will resume IV Lasix. Follow renal function closely. GDMT as tolerated.   ECHOCARDIOGRAM COMPLETE  Result Date: 06/20/2022    ECHOCARDIOGRAM REPORT   Patient Name:   Troy AZZARELLODate of Exam: 06/20/2022 Medical Rec #:  [id number]    Height:       63.0 in Accession #:    [id number]   Weight:       229.9 lb Date of Birth:  3November 27, 1972    BSA:          2.052 m Patient Age:    57years      BP:           170/110 mmHg Patient Gender: M             HR:           77 bpm. Exam Location:  Inpatient Procedure: 2D Echo, Cardiac Doppler and Color Doppler Indications:    Elevated Troponin   History:        Patient has no prior history of Echocardiogram examinations.  Risk Factors:Hypertension and Diabetes.  Sonographer:    Bernadene Person RDCS Referring Phys: [id number] [place]  1. Left ventricular ejection fraction, by estimation, is 30 to 35%. The left ventricle has moderately decreased function. The left ventricle demonstrates global hypokinesis. The left ventricular internal cavity size was moderately dilated. Left ventricular diastolic parameters are consistent with Grade III diastolic dysfunction (restrictive).  2. Right ventricular systolic function is normal. The right ventricular size is normal. Tricuspid regurgitation signal is inadequate for assessing PA pressure.  3. The mitral valve is normal in structure. No evidence of mitral valve regurgitation. No evidence of mitral stenosis.  4. The aortic valve is normal in structure. Aortic valve regurgitation is not visualized. No aortic stenosis is present.  5. The inferior vena cava is normal in size with <50% respiratory variability, suggesting right atrial pressure of 8 mmHg. Comparison(s): No prior Echocardiogram. FINDINGS  Left Ventricle: Left ventricular ejection fraction, by estimation, is 30 to 35%. The left ventricle has moderately decreased function. The left ventricle demonstrates global hypokinesis. The left ventricular internal cavity size was moderately dilated. There is no left ventricular hypertrophy. Left ventricular diastolic parameters are consistent with Grade III diastolic dysfunction (restrictive). Right Ventricle: The right ventricular size is normal. No increase in right ventricular wall thickness. Right ventricular systolic function is normal. Tricuspid regurgitation signal is inadequate for assessing PA pressure. Left Atrium: Left atrial size was normal in size. Right Atrium: Right atrial size was normal in size. Pericardium: There is no evidence of pericardial effusion. Mitral Valve: The  mitral valve is normal in structure. No evidence of mitral valve regurgitation. No evidence of mitral valve stenosis. Tricuspid Valve: The tricuspid valve is normal in structure. Tricuspid valve regurgitation is not demonstrated. No evidence of tricuspid stenosis. Aortic Valve: The aortic valve is normal in structure. Aortic valve regurgitation is not visualized. No aortic stenosis is present. Pulmonic Valve: The pulmonic valve was normal in structure. Pulmonic valve regurgitation is not visualized. No evidence of pulmonic stenosis. Aorta: The aortic root is normal in size and structure. Venous: The inferior vena cava is normal in size with less than 50% respiratory variability, suggesting right atrial pressure of 8 mmHg. IAS/Shunts: No atrial level shunt detected by color flow Doppler.  LEFT VENTRICLE PLAX 2D LVIDd:         6.70 cm     Diastology LVIDs:         5.50 cm     LV e' medial:    4.50 cm/s LV PW:         1.00 cm     LV E/e' medial:  27.8 LV IVS:        0.80 cm     LV e' lateral:   4.05 cm/s LVOT diam:     2.30 cm     LV E/e' lateral: 30.9 LV SV:         65 LV SV Index:   32 LVOT Area:     4.15 cm  LV Volumes (MOD) LV vol d, MOD A2C: 87.0 ml LV vol d, MOD A4C: 89.3 ml LV vol s, MOD A2C: 56.0 ml LV vol s, MOD A4C: 57.6 ml LV SV MOD A2C:     31.0 ml LV SV MOD A4C:     89.3 ml LV SV MOD BP:      30.1 ml RIGHT VENTRICLE RV S prime:     10.10 cm/s TAPSE (M-mode): 2.4 cm LEFT ATRIUM  Index        RIGHT ATRIUM           Index LA diam:        4.60 cm 2.24 cm/m   RA Area:     16.00 cm LA Vol (A2C):   62.0 ml 30.22 ml/m  RA Volume:   45.20 ml  22.03 ml/m LA Vol (A4C):   58.3 ml 28.41 ml/m LA Biplane Vol: 64.7 ml 31.53 ml/m  AORTIC VALVE LVOT Vmax:   99.40 cm/s LVOT Vmean:  61.100 cm/s LVOT VTI:    0.156 m  AORTA Ao Root diam: 3.40 cm Ao Asc diam:  3.50 cm MITRAL VALVE MV Area (PHT): 6.17 cm     SHUNTS MV Decel Time: 123 msec     Systemic VTI:  0.16 m MV E velocity: 125.00 cm/s  Systemic Diam: 2.30  cm MV A velocity: 45.40 cm/s MV E/A ratio:  2.75 Kardie Tobb DO Electronically signed by Berniece Salines DO Signature Date/Time: 06/20/2022/11:31:47 AM    Final    CT Angio Chest PE W and/or Wo Contrast  Result Date: 06/19/2022 CLINICAL DATA:  Respiratory distress EXAM: CT ANGIOGRAPHY CHEST WITH CONTRAST TECHNIQUE: Multidetector CT imaging of the chest was performed using the standard protocol during bolus administration of intravenous contrast. Multiplanar CT image reconstructions and MIPs were obtained to evaluate the vascular anatomy. RADIATION DOSE REDUCTION: This exam was performed according to the departmental dose-optimization program which includes automated exposure control, adjustment of the mA and/or kV according to patient size and/or use of iterative reconstruction technique. CONTRAST:  60 mL OMNIPAQUE IOHEXOL 350 MG/ML SOLN COMPARISON:  Chest x-ray 06/19/2022 FINDINGS: Cardiovascular: Satisfactory opacification of the pulmonary arteries to the segmental level. No evidence of pulmonary embolism. Nonaneurysmal aorta. Mild atherosclerosis. Cardiomegaly. No pericardial effusion. Mediastinum/Nodes: Midline trachea. No thyroid mass. Multiple mildly enlarged mediastinal lymph nodes. Left paratracheal node measures 1.7 cm. Right paratracheal nodes measure up to 1.6 cm. Subcarinal lymph nodes measuring up to 2.2 cm. Esophagus within normal limits. Lungs/Pleura: Negative for pleural effusion. Extensive bilateral ground-glass density with interlobular septal thickening and appearance of crazy paving pattern. Patchy consolidations in the upper and lower lobes. Upper Abdomen: Gallstones.  No acute finding Musculoskeletal: No chest wall abnormality. No acute or significant osseous findings. Review of the MIP images confirms the above findings. IMPRESSION: 1. Negative for acute pulmonary embolus. 2. Cardiomegaly. Extensive bilateral ground-glass density with interlobular septal thickening and appearance of crazy  paving pattern. Patchy consolidations in the upper and lower lobes. Findings are nonspecific and could be secondary to multifocal pneumonia, pulmonary edema, or ARDS, in addition to less common etiologies such as interstitial pneumoni or alveolar proteinosis 3. Mediastinal adenopathy, likely reactive. 4. Gallstones. Aortic Atherosclerosis (ICD10-I70.0). Electronically Signed   By: Donavan Foil M.D.   On: 06/19/2022 22:45   DG Chest Portable 1 View  Result Date: 06/19/2022 CLINICAL DATA:  Shortness of breath, respiratory distress EXAM: PORTABLE CHEST 1 VIEW COMPARISON:  11/14/2014 FINDINGS: Cardiomegaly, vascular congestion. Bilateral airspace disease. No effusions. No acute bony abnormality. IMPRESSION: Cardiomegaly. Bilateral airspace disease could reflect edema or infection. Electronically Signed   By: Rolm Baptise M.D.   On: 06/19/2022 22:02       The results of significant diagnostics from this hospitalization (including imaging, microbiology, ancillary and laboratory) are listed below for reference.     Microbiology: Recent Results (from the past 240 hour(s))  Resp Panel by RT-PCR (Flu A&B, Covid) Anterior Nasal Swab     Status:  None   Collection Time: 06/20/22  1:48 PM   Specimen: Anterior Nasal Swab  Result Value Ref Range Status   SARS Coronavirus 2 by RT PCR NEGATIVE NEGATIVE Final    Comment: (NOTE) SARS-CoV-2 target nucleic acids are NOT DETECTED.  The SARS-CoV-2 RNA is generally detectable in upper respiratory specimens during the acute phase of infection. The lowest concentration of SARS-CoV-2 viral copies this assay can detect is 138 copies/mL. A negative result does not preclude SARS-Cov-2 infection and should not be used as the sole basis for treatment or other patient management decisions. A negative result may occur with  improper specimen collection/handling, submission of specimen other than nasopharyngeal swab, presence of viral mutation(s) within the areas  targeted by this assay, and inadequate number of viral copies(<138 copies/mL). A negative result must be combined with clinical observations, patient history, and epidemiological information. The expected result is Negative.  Fact Sheet for Patients:  EntrepreneurPulse.com.au  Fact Sheet for Healthcare Providers:  IncredibleEmployment.be  This test is no t yet approved or cleared by the [place] FDA and  has been authorized for detection and/or diagnosis of SARS-CoV-2 by FDA under an Emergency Use Authorization (EUA). This EUA will remain  in effect (meaning this test can be used) for the duration of the COVID-19 declaration under Section 564(b)(1) of the Act, 21 U.S.C.section 360bbb-3(b)(1), unless the authorization is terminated  or revoked sooner.       Influenza A by PCR NEGATIVE NEGATIVE Final   Influenza B by PCR NEGATIVE NEGATIVE Final    Comment: (NOTE) The Xpert Xpress SARS-CoV-2/FLU/RSV plus assay is intended as an aid in the diagnosis of influenza from Nasopharyngeal swab specimens and should not be used as a sole basis for treatment. Nasal washings and aspirates are unacceptable for Xpert Xpress SARS-CoV-2/FLU/RSV testing.  Fact Sheet for Patients: EntrepreneurPulse.com.au  Fact Sheet for Healthcare Providers: IncredibleEmployment.be  This test is not yet approved or cleared by the [place] FDA and has been authorized for detection and/or diagnosis of SARS-CoV-2 by FDA under an Emergency Use Authorization (EUA). This EUA will remain in effect (meaning this test can be used) for the duration of the COVID-19 declaration under Section 564(b)(1) of the Act, 21 U.S.C. section 360bbb-3(b)(1), unless the authorization is terminated or revoked.  Performed at [facility], [place] [address], [place], [place] [address]   MRSA Next Gen by PCR, Nasal     Status: None    Collection Time: 06/20/22  1:49 PM   Specimen: Anterior Nasal Swab  Result Value Ref Range Status   MRSA by PCR Next Gen NOT DETECTED NOT DETECTED Final    Comment: (NOTE) The GeneXpert MRSA Assay (FDA approved for NASAL specimens only), is one component of a comprehensive MRSA colonization surveillance program. It is not intended to diagnose MRSA infection nor to guide or monitor treatment for MRSA infections. Test performance is not FDA approved in patients less than 55 years old. Performed at [facility], [place] [address] [place], [place] [address]   Culture, blood (Routine X 2) w Reflex to ID Panel     Status: None (Preliminary result)   Collection Time: 06/22/22  6:49 PM   Specimen: BLOOD RIGHT ARM  Result Value Ref Range Status   Specimen Description   Final    BLOOD RIGHT ARM Performed at [facility], [place] [address] [place], [place] [address]    Special Requests   Final    BOTTLES DRAWN AEROBIC  AND ANAEROBIC Blood Culture adequate volume Performed at [place] [address], [place], [place] [address]    Culture   Final    NO GROWTH 3 DAYS Performed at [facility], [place] [address] [address], [place], [place] [address]    Report Status PENDING  Incomplete  Culture, blood (Routine X 2) w Reflex to ID Panel     Status: None (Preliminary result)   Collection Time: 06/22/22  6:50 PM   Specimen: BLOOD RIGHT HAND  Result Value Ref Range Status   Specimen Description   Final    BLOOD RIGHT HAND Performed at [facility], [place] [address] [place], [place] [address]    Special Requests   Final    BOTTLES DRAWN AEROBIC ONLY Blood Culture adequate volume Performed at [place] [address], [place], [place] [address]    Culture   Final    NO GROWTH 3 DAYS Performed at [facility], [place] [address], [place],  [address]    Report Status PENDING  Incomplete     Labs:  CBC: Recent Labs   Lab 06/19/22 2128 06/19/22 2135 06/21/22 0447 06/21/22 0937 06/21/22 0944 06/22/22 0255 06/23/22 0343 06/24/22 0337 06/25/22 0348  WBC 15.7*   < > 10.7*  --   --  9.3 10.0 8.7 8.4  NEUTROABS 13.4*  --   --   --   --   --   --   --   --   HGB 12.6*   < > 11.3*   < > 11.6* 11.2* 11.2* 11.7* 12.4*  HCT 42.0   < > 37.1*   < > 34.0* 35.7* 35.9* 38.0* 40.2  MCV 89.7   < > 85.7  --   --  85.4 84.3 85.4 84.5  PLT 336   < > 272  --   --  267 296 326 384   < > = values in this interval not displayed.   BMP &GFR Recent Labs  Lab 06/21/22 0043 06/21/22 0447 06/21/22 1547 06/22/22 0255 06/23/22 0343 06/24/22 0337 06/25/22 0348  NA 136   < > 138 137 136 138 136  K 3.8   < > 4.2 4.3 3.9 3.9 4.6  CL 103   < > 103 102 100 102 99  CO2 23   < > _0 GLUCOSE 116*   < > 114* 117* 117* 110* 171*  BUN 26*   < > 29* 31* 29* 34* 40*  CREATININE 1.71*   < > 1.63* 1.65* 1.55* 1.87* 1.57*  CALCIUM 8.5*   < > 8.8* 8.6* 8.6* 9.1 9.5  MG 1.9  --  2.5*  --  2.1 2.3 2.6*  PHOS 3.3  --   --   --  3.9 4.9* 5.2*   < > = values in this interval not displayed.   Estimated Creatinine Clearance: 55.5 mL/min (A) (by C-G formula based on SCr of 1.57 mg/dL (H)). Liver & Pancreas: Recent Labs  Lab 06/20/22 0005 06/20/22 0150 06/23/22 0343 06/24/22 0337 06/25/22 0348  AST 43* 58* 22  --   --   ALT _1 --   --   ALKPHOS 60 60 51  --   --   BILITOT 0.4 0.6 0.6  --   --   PROT 7.3 7.4 6.9  --   --   ALBUMIN 3.1* 3.3* 2.8* 2.9* 3.3*   No results  for input(s): "LIPASE", "AMYLASE" in the last 168 hours. No results for input(s): "AMMONIA" in the last 168 hours. Diabetic: Recent Labs    06/23/22 0343  HGBA1C 6.2*   Recent Labs  Lab 06/20/22 0849 06/20/22 1149 06/20/22 1646  GLUCAP 91 90 104*   Cardiac Enzymes: No results for input(s): "CKTOTAL", "CKMB", "CKMBINDEX", "TROPONINI" in the last 168 hours. No results for input(s): "PROBNP" in the last 8760 hours. Coagulation  Profile: No results for input(s): "INR", "PROTIME" in the last 168 hours. Thyroid Function Tests: No results for input(s): "TSH", "T4TOTAL", "FREET4", "T3FREE", "THYROIDAB" in the last 72 hours. Lipid Profile: No results for input(s): "CHOL", "HDL", "LDLCALC", "TRIG", "CHOLHDL", "LDLDIRECT" in the last 72 hours. Anemia Panel: No results for input(s): "VITAMINB12", "FOLATE", "FERRITIN", "TIBC", "IRON", "RETICCTPCT" in the last 72 hours. Urine analysis: No results found for: "COLORURINE", "APPEARANCEUR", "LABSPEC", "PHURINE", "GLUCOSEU", "HGBUR", "BILIRUBINUR", "KETONESUR", "PROTEINUR", "UROBILINOGEN", "NITRITE", "LEUKOCYTESUR" Sepsis Labs: Invalid input(s): "PROCALCITONIN", "LACTICIDVEN"   SIGNED:  Mercy Riding, MD  [facility] 06/25/2022, 5:01 PM

## 2022-06-25 NOTE — TOC Initial Note (Addendum)
Transition of Care [facility]) - Initial/Assessment Note    Patient Details  Name: Troy Singh MRN: [id number] Date of Birth: [birth year]  [facility] of [facility] CM/SW Contact:    Larrie Kass, LCSW Phone Number: 06/25/2022, 1:20 PM  Clinical Narrative:                 TOCCSW requested assistance from [facility] for [facility] letter. CSW spoke with pt and informed pt about the MATCH program.pt was given [facility] letter  Pt stated he is working on getting it. CSW spoke with pt about [facility] financial assistance program that may assist with medical appointments. Pt has agreed for CSW to arrange PCP appointment on his behalf to establish primary care.  Adden 2:00pm PCP appointment has been made with [facility] for Friday December 15th at 10am. Appointment added to pt's AVS.    Expected Discharge Plan: Home/Self Care Barriers to Discharge: Continued Medical Work up   Patient Goals and CMS Choice Patient states their goals for this hospitalization and ongoing recovery are:: return home      Expected Discharge Plan and Services Expected Discharge Plan: Home/Self Care                                              Prior Living Arrangements/Services   Lives with:: Self Patient language and need for interpreter reviewed:: Yes Do you feel safe going back to the place where you live?: Yes      Need for Family Participation in Patient Care: No (Comment) Care giver support system in place?: No (comment)   Criminal Activity/Legal Involvement Pertinent to Current Situation/Hospitalization: No - Comment as needed  Activities of Daily Living Home Assistive Devices/Equipment: Crutches ADL Screening (condition at time of admission) Patient's cognitive ability adequate to safely complete daily activities?: Yes Is the patient deaf or have difficulty hearing?: No Does the patient have difficulty seeing, even when wearing glasses/contacts?: No Does the  patient have difficulty concentrating, remembering, or making decisions?: No Patient able to express need for assistance with ADLs?: Yes Does the patient have difficulty dressing or bathing?: No Independently performs ADLs?: Yes (appropriate for developmental age) Does the patient have difficulty walking or climbing stairs?: Yes Weakness of Legs: Left Weakness of Arms/Hands: None  Permission Sought/Granted                  Emotional Assessment Appearance:: Appears stated age Attitude/Demeanor/Rapport: Gracious Affect (typically observed): Accepting Orientation: : Oriented to Self, Oriented to Place, Oriented to  Time, Oriented to Situation   Psych Involvement: No (comment)  Admission diagnosis:  Hypertensive emergency [I16.1] Patient Active Problem List   Diagnosis Date Noted   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   NSVT (nonsustained ventricular tachycardia) (HCC) 06/23/2022   Fever 06/23/2022   Acute combined systolic and diastolic heart failure (HCC) 06/22/2022   Coronary artery disease due to lipid rich plaque 06/22/2022   Pure hypercholesterolemia 06/22/2022   AKI (acute kidney injury) (HCC) 06/22/2022   CAP (community acquired pneumonia) 06/22/2022   At risk for sleep apnea 06/22/2022   Gout flare 06/22/2022   Morbid obesity (HCC) 06/22/2022   DCM (dilated cardiomyopathy) (HCC) 06/21/2022   Hypertensive emergency 06/20/2022   Elevated troponin 06/20/2022   DM type 2, goal HbA1c < 7% (HCC) 06/20/2022   Acute congestive heart failure (HCC) 06/20/2022   PCP:  Patient, No Pcp Per Pharmacy:   [facility] [address], [place] - [address] [place] [address] Phone: [phone] Fax: [phone]     Social Determinants of Health (SDOH) Interventions    Readmission Risk Interventions     No data to display

## 2022-06-25 NOTE — Progress Notes (Signed)
Heart Failure booklet given to patient. Medications and diet information discussed.

## 2022-06-25 NOTE — Plan of Care (Signed)
  Problem: Education: Goal: Knowledge of General Education information will improve Description: Including pain rating scale, medication(s)/side effects and non-pharmacologic comfort measures Outcome: Progressing   Problem: Health Behavior/Discharge Planning: Goal: Ability to manage health-related needs will improve Outcome: Progressing   Problem: Clinical Measurements: Goal: Ability to maintain clinical measurements within normal limits will improve Outcome: Progressing Goal: Diagnostic test results will improve Outcome: Progressing Goal: Respiratory complications will improve Outcome: Progressing Goal: Cardiovascular complication will be avoided Outcome: Progressing   Problem: Education: Goal: Ability to demonstrate management of disease process will improve Outcome: Progressing Goal: Ability to verbalize understanding of medication therapies will improve Outcome: Progressing

## 2022-06-25 NOTE — Progress Notes (Signed)
MATCH Medication Assistance Card  Name: Troy Singh ID (MRN): [id number] Bin: [id number] RX Group: [id number] Discharge Date: 06/25/22 Expiration Date:06/29/22                                          (must be filled within 7 days of discharge)     You have been approved to have the prescriptions written by your discharging physician filled through our [facility] (Medication Assistance Through [facility] program. This program allows for a one-time (no refills) 34-day supply of selected medications for a low copay amount.  The copay is $3.00 per prescription. For instance, if you have one prescription, you will pay $3.00; for two prescriptions, you pay $6.00; for three prescriptions, you pay $9.00; and so on.  Only certain pharmacies are participating in this program with [facility]. You will need to select one of the pharmacies from the attached list and take your prescriptions, this letter, and your photo ID to one of the [facility]patient pharmacies, [facility], [facility] [address] or [facility] [address].   We are excited that you are able to use the [facility] program to get your medications. These prescriptions must be filled within 7 days of hospital discharge or they will no longer be valid for the [facility] program. Should you have any problems with your prescriptions please contact your case management team member at [phone] for [place]/ [facility].  Thank you, [facility] Management   [facility] RNCM [phone]

## 2022-06-26 ENCOUNTER — Encounter: Payer: Self-pay | Admitting: *Deleted

## 2022-06-26 ENCOUNTER — Telehealth: Payer: Self-pay | Admitting: Physician Assistant

## 2022-06-26 ENCOUNTER — Other Ambulatory Visit: Payer: Self-pay

## 2022-06-26 ENCOUNTER — Other Ambulatory Visit (INDEPENDENT_AMBULATORY_CARE_PROVIDER_SITE_OTHER): Payer: Self-pay

## 2022-06-26 ENCOUNTER — Other Ambulatory Visit (HOSPITAL_COMMUNITY): Payer: Self-pay

## 2022-06-26 DIAGNOSIS — R001 Bradycardia, unspecified: Secondary | ICD-10-CM

## 2022-06-26 NOTE — TOC Transition Note (Signed)
Transition of Care [facility]) - CM/SW Discharge Note   Patient Details  Name: Troy Singh MRN: [id number] Date of Birth: 10/10/1970  [facility]) CM/SW Contact:  Larrie Kass, LCSW Phone Number: 06/26/2022, 11:07 AM   Clinical Narrative:     Pt received MATCH letter pt is unable to receive over ride on [employer] due to the cost. MD was made aware. Pt has received all resources available, TOC will sign off at this time.    Barriers to Discharge: Barriers Resolved   Patient Goals and CMS Choice Patient states their goals for this hospitalization and ongoing recovery are:: retun home      Discharge Placement                       Discharge Plan and Services                                     Social Determinants of Health (SDOH) Interventions     Readmission Risk Interventions     No data to display

## 2022-06-26 NOTE — Progress Notes (Unsigned)
Enrolled for [employer] to mail a [employer] Live Telemetry monitor to patients address on file.   Dr.Tobb to read. 

## 2022-06-26 NOTE — Plan of Care (Signed)
  Problem: Education: Goal: Knowledge of General Education information will improve Description: Including pain rating scale, medication(s)/side effects and non-pharmacologic comfort measures Outcome: Adequate for Discharge   Problem: Health Behavior/Discharge Planning: Goal: Ability to manage health-related needs will improve Outcome: Adequate for Discharge   Problem: Clinical Measurements: Goal: Ability to maintain clinical measurements within normal limits will improve Outcome: Adequate for Discharge Goal: Will remain free from infection Outcome: Adequate for Discharge Goal: Diagnostic test results will improve Outcome: Adequate for Discharge Goal: Respiratory complications will improve Outcome: Adequate for Discharge Goal: Cardiovascular complication will be avoided Outcome: Adequate for Discharge   Problem: Nutrition: Goal: Adequate nutrition will be maintained Outcome: Adequate for Discharge   Problem: Coping: Goal: Level of anxiety will decrease Outcome: Adequate for Discharge   Problem: Elimination: Goal: Will not experience complications related to bowel motility Outcome: Adequate for Discharge Goal: Will not experience complications related to urinary retention Outcome: Adequate for Discharge   Problem: Skin Integrity: Goal: Risk for impaired skin integrity will decrease Outcome: Adequate for Discharge   Problem: Education: Goal: Understanding of CV disease, CV risk reduction, and recovery process will improve Outcome: Adequate for Discharge Goal: Individualized Educational Video(s) Outcome: Adequate for Discharge   Problem: Activity: Goal: Ability to return to baseline activity level will improve Outcome: Adequate for Discharge   Problem: Cardiovascular: Goal: Ability to achieve and maintain adequate cardiovascular perfusion will improve Outcome: Adequate for Discharge Goal: Vascular access site(s) Level 0-1 will be maintained Outcome: Adequate for  Discharge   Problem: Health Behavior/Discharge Planning: Goal: Ability to safely manage health-related needs after discharge will improve Outcome: Adequate for Discharge   Problem: Education: Goal: Ability to demonstrate management of disease process will improve Outcome: Adequate for Discharge Goal: Ability to verbalize understanding of medication therapies will improve Outcome: Adequate for Discharge Goal: Individualized Educational Video(s) Outcome: Adequate for Discharge   Problem: Activity: Goal: Capacity to carry out activities will improve Outcome: Adequate for Discharge   Problem: Cardiac: Goal: Ability to achieve and maintain adequate cardiopulmonary perfusion will improve Outcome: Adequate for Discharge

## 2022-06-26 NOTE — Plan of Care (Signed)
  Problem: Education: Goal: Knowledge of General Education information will improve Description: Including pain rating scale, medication(s)/side effects and non-pharmacologic comfort measures Outcome: Progressing   Problem: Coping: Goal: Level of anxiety will decrease Outcome: Progressing   Problem: Elimination: Goal: Will not experience complications related to urinary retention Outcome: Progressing   Problem: Skin Integrity: Goal: Risk for impaired skin integrity will decrease Outcome: Progressing   Problem: Activity: Goal: Capacity to carry out activities will improve Outcome: Progressing

## 2022-06-26 NOTE — Telephone Encounter (Signed)
Dr. Servando Salina requests 14 day live [facility] monitor for this patient for bradycardia. Will send request to monitor team. Spoke with pt via phone who is agreeable. He knows monitor will be mailed to his house. Since AVS already printed, I reviewed with his nurse who will relay Dr. Mallory Shirk office # to him if any questions arise. Otherwise has f/u scheduled with CHF clinic 07/16/22.

## 2022-06-26 NOTE — Progress Notes (Signed)
At 0018 on 06/26/22: RN notified on call provider about patient's HR dropping down to 39. RN checked on patient and patient was resting comfortably when coming in to check patient's BP for the scheduled hydralazine. Patient was also asymptomatic. Patient's HR was 59 when checking patient's BP.  At 0618 on 06/26/22: RN notified on call provider about patient's HR dropping down to the low 30's and even 20's with a 3.41 second pause sinus arrest. Checked on patient. Patient was resting comfortably. When patient woke up, the patient told RN that he was okay and was asymptomatic.

## 2022-06-26 NOTE — Discharge Summary (Signed)
Physician Discharge Summary  Troy Singh LAG:[id number] DOB: 1971/02/06 DOA: 06/19/2022  PCP: Patient, No Pcp Per  Admit date: 06/19/2022 Discharge date: 06/26/2022 Admitted From: Home Disposition: Home Recommendations for Outpatient Follow-up:  Follow up with PCP as below Outpatient follow-up with cardiology as below Outpatient heart monitor per cardiology. Check CMP and CBC in 1 to 2 weeks Reassess fluid status and adjust cardiac meds as appropriate Please follow up on the following pending results: None  Home Health: Not indicated Equipment/Devices: Not indicated  Discharge Condition: Stable CODE STATUS: Full code  Follow-up Information     [facility] Follow up in 22 day(s).   Specialty: Cardiology Why: Hospital follow up appoinment 07/16/2022 @ 2 pm PLEASE bring a current medication list to appointment [place], off [address]. Contact information: [address] [id number] [place] [phone]        Primary Care at [facility] Follow up in 2 day(s).   Specialty: Family Medicine Why: Hospital Follow up appointment is Friday December 15 at 10 am, you can apply for financial assistance when you establish Primary Care. Contact information: [address], [place] [phone]                Hospital course 51 year old M with PMH of DM-2, HTN, obesity, former tobacco use and noncompliance with medication presenting with shortness of breath, and admitted for acute respiratory failure with hypoxia in the setting of acute combined CHF, non-STEMI, hypertensive emergency, CAP and AKI.  CXR showed cardiomegaly and bilateral airspace disease.  BNP 460.  Procalcitonin elevated.  Patient was started on CAP coverage, IV diuretics on BiPAP and admitted to ICU by pulmonology.  TTE with LVEF of 30 to 35%, GH, G3-DD.  R/LHC with  nonobstructive CAD, NICM and elevated right and left heart pressures.  Eventually, his respiratory status improved and he was transferred to hospitalist service on 06/22/2022.  Nuclear scan suggests transthyretin amyloidosis.   The day of discharge, respiratory failure resolved, and he was cleared for discharge by cardiology.  Initially, cardiology recommended increasing Coreg to 6.25 mg twice daily.  After discussion about intermittent bradycardia and borderline heart rate, he was discharged on Coreg 3.125 mg twice daily.  Cardiology to arrange outpatient heart monitor.  He will be on p.o. Lasix 40 mg daily, hydralazine 25 mg 3 times daily, Imdur 30 mg daily. Patient has been provided with Match letter for his prescriptions.  Cardiology recommended Jardiance but patient has no insurance and out-of-pocket price is about $2000.  This is not covered by Match letter.  Cardiology aware of the situation.    Patient has been counseled on the importance of compliance with medication and sodium and fluid restriction and daily weight.   Patient completed CAP coverage with ceftriaxone and azithromycin prior to discharge.  Hospital course notable for acute gout flareup for which she has been started on colchicine and prednisone.  Will start allopurinol once he complete course of prednisone.   See individual problem list below for more.   Problems addressed during this hospitalization Principal Problem:   Hypertensive emergency Active Problems:   Elevated troponin   DM type 2, goal HbA1c < 7% (HCC)   Acute congestive heart failure (HCC)   DCM (dilated cardiomyopathy) (HCC)   Acute combined systolic and diastolic heart failure (HCC)   Coronary artery disease due to lipid rich plaque   Pure hypercholesterolemia   AKI (  acute kidney injury) ([place])   CAP (community acquired pneumonia)   At risk for sleep apnea   Gout flare   Morbid obesity (HCC)   NSVT (nonsustained ventricular tachycardia) (HCC)    Fever   Cardiac amyloidosis (HCC)   Acute respiratory failure with hypoxia: Documented desaturation to 87 and 88%.  Likely due to acute CHF, hypertensive emergency, non-STEMI and CAP.  Cannot rule out sleep apnea.  Respiratory failure resolved.   Acute combined CHF/cardiac amyloidosis/DCM: BNP elevated to 460. TTE with LVEF of 30 to 35%, GH, G3-DD.  R/LHC with nonobstructive CAD, NICM and elevated right and left heart pressures.  Nuclear scan suggests cardiac amyloidosis. Diuresed with IV Lasix 40 mg twice daily.  I and O incomplete but net 7.6 L.  Discharge weight 200.4 pounds.  BP and creatinine stable. -Discharge on p.o. Lasix, low-dose Coreg, Imdur and hydralazine. -Unfortunately, patient was not able to afford Jardiance.  -Cardiology to arrange outpatient follow-up. -May need outpatient sleep study.   Intermittent bradycardia/NSVT: Not symptomatic. -Discharged on low-dose Coreg 3.125 mg twice daily after discussion with cardiology. -Consider outpatient sleep study   Hypertensive emergency: BP improved. -Cardiac/antihypertensive meds as above -Needs outpatient sleep study   Non-STEMI/nonobstructive CAD: R/LHC as above. -Cardiac meds as above -Continue aspirin and Lipitor   Community-acquired pneumonia: Pro-Cal elevated. -Completed 5 days of azithromycin and ceftriaxone in house.   AKI/CKD-3A: Unknown baseline but Cr 1.43 in 10/2021. Stable. Recent Labs    06/20/22 0005 06/20/22 0150 06/20/22 0840 06/21/22 0043 06/21/22 0447 06/21/22 1547 06/22/22 0255 06/23/22 0343 06/24/22 0337 06/25/22 0348  BUN 25* 25* 24* 26* 24* 29* 31* 29* 34* 40*  CREATININE 1.67* 1.56* 1.42* 1.71* 1.55* 1.63* 1.65* 1.55* 1.87* 1.57*  -Recheck renal function at follow-up.     Acute gout flare in bilateral elbows: Likely from diuretics.  Reports history of gout.  Uric acid elevated to 11.  CRP and ESR elevated.  Continues to endorse left elbow pain.  X-ray with soft tissue swelling, small elbow  effusion and possible osteochondral defect of capitulum.  Swelling and pain improved after starting prednisone. -Continue colchicine -P.o. prednisone 30 mg daily for 4 more days -Patient to start allopurinol after completing course of prednisone   At risk for sleep apnea -Needs outpatient sleep study   Noncompliance with medication: Patient has no PCP.  Not compliant with antihypertensive meds. -Counseled on the importance compliance with his medication and follow-up with PCP   Fever: Spiked fever to 101.8 the evening of 12/9.  Likely from gout.  Blood cultures NGTD.  Already on antibiotics for pneumonia.  No UTI symptoms.  No leukocytosis.  No further fever.   Morbid obesity Body mass index is 36.2 kg/m -Encourage lifestyle change to lose weight.   Vital signs Vitals:   06/25/22 2122 06/26/22 0015 06/26/22 0513 06/26/22 0648  BP: (!) 141/74 138/68 127/81 134/88  Pulse: 67 (!) 59 (!) 51 (!) 58  Temp: 98.2 F (36.8 C)  97.7 F (36.5 C)   Resp: 16  16   Height:      Weight:   90.9 kg   SpO2: 99%  97%   TempSrc: Oral  Oral   BMI (Calculated):   35.51      Discharge exam  GENERAL: No apparent distress.  Nontoxic. HEENT: MMM.  Vision and hearing grossly intact.  NECK: Supple.  No apparent JVD.  RESP:  No IWOB.  Fair aeration bilaterally. CVS:  RRR. Heart sounds normal.  ABD/GI/GU: BS+. Abd soft,  NTND.  MSK/EXT:  Moves extremities. No apparent deformity. No edema.  SKIN: no apparent skin lesion or wound NEURO: Awake and alert. Oriented appropriately.  No apparent focal neuro deficit. PSYCH: Calm. Normal affect.   Discharge Instructions Discharge Instructions     (HEART FAILURE PATIENTS) Call MD:  Anytime you have any of the following symptoms: 1) 3 pound weight gain in 24 hours or 5 pounds in 1 week 2) shortness of breath, with or without a dry hacking cough 3) swelling in the hands, feet or stomach 4) if you have to sleep on extra pillows at night in order to breathe.    Complete by: As directed    Call MD for:  difficulty breathing, headache or visual disturbances   Complete by: As directed    Call MD for:  persistant dizziness or light-headedness   Complete by: As directed    Diet - low sodium heart healthy   Complete by: As directed    Discharge instructions   Complete by: As directed    It has been a pleasure taking care of you!  You were hospitalized due to trouble breathing likely from heart failure and pneumonia.  You have been treated with antibiotics for pneumonia.  We have started you on medications for heart failure.  It is very important that you take your medications as prescribed.  Follow-up with cardiology per their recommendation.  In addition to taking your medications as prescribed, we also recommend you avoid alcohol or over-the-counter pain medication other than plain Tylenol, limit the amount of water/fluid you drink to less than 6 cups (1500 cc) a day,  limit your sodium (salt) intake to less than 2 g (2000 mg) a day and weigh yourself daily at the same time and keeping your weight log.     Take care,   Increase activity slowly   Complete by: As directed       Allergies as of 06/26/2022   No Known Allergies      Medication List     STOP taking these medications    amLODipine 2.5 MG tablet Commonly known as: NORVASC   levETIRAcetam 500 MG tablet Commonly known as: Keppra       TAKE these medications    acetaminophen 500 MG tablet Commonly known as: TYLENOL Take 1,000 mg by mouth every 6 (six) hours as needed for mild pain.   allopurinol 100 MG tablet Commonly known as: Zyloprim Take 1 tablet (100 mg total) by mouth daily. Start taking on: June 29, 2022   aspirin EC 81 MG tablet Take 1 tablet (81 mg total) by mouth daily. Swallow whole.   atorvastatin 40 MG tablet Commonly known as: LIPITOR Take 1 tablet (40 mg total) by mouth daily.   carvedilol 3.125 MG tablet Commonly known as: COREG Take 1  tablet (3.125 mg total) by mouth 2 (two) times daily with a meal.   colchicine 0.6 MG tablet Take 1 tablet (0.6 mg total) by mouth daily.   empagliflozin 10 MG Tabs tablet Commonly known as: JARDIANCE Take 1 tablet (10 mg total) by mouth daily.   furosemide 40 MG tablet Commonly known as: Lasix Take 1 tablet (40 mg total) by mouth daily.   hydrALAZINE 25 MG tablet Commonly known as: APRESOLINE Take 1 tablet (25 mg total) by mouth every 8 (eight) hours.   isosorbide mononitrate 30 MG 24 hr tablet Commonly known as: IMDUR Take 1 tablet (30 mg total) by mouth daily.   predniSONE 10 MG  tablet Commonly known as: DELTASONE Take 3 tablets (30 mg total) by mouth daily with breakfast for 3 days.        Consultations: Pulmonology admitted patient. Cardiology  Procedures/Studies: 12/8-R/LHC with nonobstructive CAD, NICM, increased right and left heart pressures.    NM CARDIAC AMYLOID TUMOR LOC INFLAM SPECT 1 DAY  Result Date: 06/24/2022 CLINICAL DATA:  HEART FAILURE. CONCERN FOR CARDIAC AMYLOIDOSIS. History type II diabetes mellitus, hypertension, former smoker EXAM: NUCLEAR MEDICINE TUMOR LOCALIZATION. PYP CARDIAC AMYLOIDOSIS SCAN WITH SPECT TECHNIQUE: Following intravenous administration of radiopharmaceutical, anterior planar images of the chest were obtained. Regions of interest were placed on the heart and contralateral chest wall for quantitative assessment. Additional SPECT imaging of the chest was obtained. RADIOPHARMACEUTICALS:  21.1 mCi TC- 15mPYROPHOSPHATE IV FINDINGS: Planar Visual assessment: Anterior planar imaging demonstrates radiotracer uptake within the heart equal to uptake within the adjacent ribs (Grade 2). Quantitative assessment : Quantitative assessment of the cardiac uptake compared to the contralateral chest wall is equal to 1.19 (H/CL = 1.19). SPECT assessment: SPECT imaging of the chest demonstrates clear radiotracer accumulation within the LEFT ventricle.  IMPRESSION: Visual and quantitative assessment (grade 2, H/CL = 1.19) is strongly suggestive of transthyretin amyloidosis. Electronically Signed   By: MLavonia DanaM.D.   On: 06/24/2022 14:32   DG Elbow 2 Views Left  Result Date: 06/24/2022 CLINICAL DATA:  Elbow pain, left EXAM: LEFT ELBOW - 2 VIEW COMPARISON:  None Available. FINDINGS: There is an elbow joint effusion and generalized soft tissue swelling, most prominent posteriorly. There is ulnar trochlear and radiocapitellar osteophyte formation. No acute fracture identified. Small olecranon enthesophyte. Possible osteochondral defect of the capitellum. IMPRESSION: Soft tissue swelling of the elbow most prominent posteriorly. Small elbow joint effusion, which could be related to occult trauma or arthritis. Possible osteochondral defect of the capitellum. Electronically Signed   By: JMaurine SimmeringM.D.   On: 06/24/2022 11:30   CARDIAC CATHETERIZATION  Result Date: 06/21/2022   Prox RCA lesion is 20% stenosed.   Prox LAD lesion is 40% stenosed.   Ost LM to Mid LM lesion is 20% stenosed. Mild non-obstructive CAD Elevated right and left heart pressures Non-ischemic cardiomyopathy Recommendations: Medical management of mild CAD. He will need further diuresis. Will resume IV Lasix. Follow renal function closely. GDMT as tolerated.   ECHOCARDIOGRAM COMPLETE  Result Date: 06/20/2022    ECHOCARDIOGRAM REPORT   Patient Name:   WMOREY ANDONIANDate of Exam: 06/20/2022 Medical Rec #:  [id number]    Height:       63.0 in Accession #:    [id number]   Weight:       229.9 lb Date of Birth:  [birth year]    BSA:          2.052 m Patient Age:    89years      BP:           170/110 mmHg Patient Gender: M             HR:           77 bpm. Exam Location:  Inpatient Procedure: 2D Echo, Cardiac Doppler and Color Doppler Indications:    Elevated Troponin  History:        Patient has no prior history of Echocardiogram examinations.                 Risk Factors:Hypertension and  Diabetes.  Sonographer:    SBereaReferring Phys: [id number]  L ICARD IMPRESSIONS  1. Left ventricular ejection fraction, by estimation, is 30 to 35%. The left ventricle has moderately decreased function. The left ventricle demonstrates global hypokinesis. The left ventricular internal cavity size was moderately dilated. Left ventricular diastolic parameters are consistent with Grade III diastolic dysfunction (restrictive).  2. Right ventricular systolic function is normal. The right ventricular size is normal. Tricuspid regurgitation signal is inadequate for assessing PA pressure.  3. The mitral valve is normal in structure. No evidence of mitral valve regurgitation. No evidence of mitral stenosis.  4. The aortic valve is normal in structure. Aortic valve regurgitation is not visualized. No aortic stenosis is present.  5. The inferior vena cava is normal in size with <50% respiratory variability, suggesting right atrial pressure of 8 mmHg. Comparison(s): No prior Echocardiogram. FINDINGS  Left Ventricle: Left ventricular ejection fraction, by estimation, is 30 to 35%. The left ventricle has moderately decreased function. The left ventricle demonstrates global hypokinesis. The left ventricular internal cavity size was moderately dilated. There is no left ventricular hypertrophy. Left ventricular diastolic parameters are consistent with Grade III diastolic dysfunction (restrictive). Right Ventricle: The right ventricular size is normal. No increase in right ventricular wall thickness. Right ventricular systolic function is normal. Tricuspid regurgitation signal is inadequate for assessing PA pressure. Left Atrium: Left atrial size was normal in size. Right Atrium: Right atrial size was normal in size. Pericardium: There is no evidence of pericardial effusion. Mitral Valve: The mitral valve is normal in structure. No evidence of mitral valve regurgitation. No evidence of mitral valve stenosis.  Tricuspid Valve: The tricuspid valve is normal in structure. Tricuspid valve regurgitation is not demonstrated. No evidence of tricuspid stenosis. Aortic Valve: The aortic valve is normal in structure. Aortic valve regurgitation is not visualized. No aortic stenosis is present. Pulmonic Valve: The pulmonic valve was normal in structure. Pulmonic valve regurgitation is not visualized. No evidence of pulmonic stenosis. Aorta: The aortic root is normal in size and structure. Venous: The inferior vena cava is normal in size with less than 50% respiratory variability, suggesting right atrial pressure of 8 mmHg. IAS/Shunts: No atrial level shunt detected by color flow Doppler.  LEFT VENTRICLE PLAX 2D LVIDd:         6.70 cm     Diastology LVIDs:         5.50 cm     LV e' medial:    4.50 cm/s LV PW:         1.00 cm     LV E/e' medial:  27.8 LV IVS:        0.80 cm     LV e' lateral:   4.05 cm/s LVOT diam:     2.30 cm     LV E/e' lateral: 30.9 LV SV:         65 LV SV Index:   32 LVOT Area:     4.15 cm  LV Volumes (MOD) LV vol d, MOD A2C: 87.0 ml LV vol d, MOD A4C: 89.3 ml LV vol s, MOD A2C: 56.0 ml LV vol s, MOD A4C: 57.6 ml LV SV MOD A2C:     31.0 ml LV SV MOD A4C:     89.3 ml LV SV MOD BP:      30.1 ml RIGHT VENTRICLE RV S prime:     10.10 cm/s TAPSE (M-mode): 2.4 cm LEFT ATRIUM             Index        RIGHT  ATRIUM           Index LA diam:        4.60 cm 2.24 cm/m   RA Area:     16.00 cm LA Vol (A2C):   62.0 ml 30.22 ml/m  RA Volume:   45.20 ml  22.03 ml/m LA Vol (A4C):   58.3 ml 28.41 ml/m LA Biplane Vol: 64.7 ml 31.53 ml/m  AORTIC VALVE LVOT Vmax:   99.40 cm/s LVOT Vmean:  61.100 cm/s LVOT VTI:    0.156 m  AORTA Ao Root diam: 3.40 cm Ao Asc diam:  3.50 cm MITRAL VALVE MV Area (PHT): 6.17 cm     SHUNTS MV Decel Time: 123 msec     Systemic VTI:  0.16 m MV E velocity: 125.00 cm/s  Systemic Diam: 2.30 cm MV A velocity: 45.40 cm/s MV E/A ratio:  2.75 Kardie Tobb DO Electronically signed by Berniece Salines DO Signature  Date/Time: 06/20/2022/11:31:47 AM    Final    CT Angio Chest PE W and/or Wo Contrast  Result Date: 06/19/2022 CLINICAL DATA:  Respiratory distress EXAM: CT ANGIOGRAPHY CHEST WITH CONTRAST TECHNIQUE: Multidetector CT imaging of the chest was performed using the standard protocol during bolus administration of intravenous contrast. Multiplanar CT image reconstructions and MIPs were obtained to evaluate the vascular anatomy. RADIATION DOSE REDUCTION: This exam was performed according to the departmental dose-optimization program which includes automated exposure control, adjustment of the mA and/or kV according to patient size and/or use of iterative reconstruction technique. CONTRAST:  60 mL OMNIPAQUE IOHEXOL 350 MG/ML SOLN COMPARISON:  Chest x-ray 06/19/2022 FINDINGS: Cardiovascular: Satisfactory opacification of the pulmonary arteries to the segmental level. No evidence of pulmonary embolism. Nonaneurysmal aorta. Mild atherosclerosis. Cardiomegaly. No pericardial effusion. Mediastinum/Nodes: Midline trachea. No thyroid mass. Multiple mildly enlarged mediastinal lymph nodes. Left paratracheal node measures 1.7 cm. Right paratracheal nodes measure up to 1.6 cm. Subcarinal lymph nodes measuring up to 2.2 cm. Esophagus within normal limits. Lungs/Pleura: Negative for pleural effusion. Extensive bilateral ground-glass density with interlobular septal thickening and appearance of crazy paving pattern. Patchy consolidations in the upper and lower lobes. Upper Abdomen: Gallstones.  No acute finding Musculoskeletal: No chest wall abnormality. No acute or significant osseous findings. Review of the MIP images confirms the above findings. IMPRESSION: 1. Negative for acute pulmonary embolus. 2. Cardiomegaly. Extensive bilateral ground-glass density with interlobular septal thickening and appearance of crazy paving pattern. Patchy consolidations in the upper and lower lobes. Findings are nonspecific and could be secondary to  multifocal pneumonia, pulmonary edema, or ARDS, in addition to less common etiologies such as interstitial pneumoni or alveolar proteinosis 3. Mediastinal adenopathy, likely reactive. 4. Gallstones. Aortic Atherosclerosis (ICD10-I70.0). Electronically Signed   By: Donavan Foil M.D.   On: 06/19/2022 22:45   DG Chest Portable 1 View  Result Date: 06/19/2022 CLINICAL DATA:  Shortness of breath, respiratory distress EXAM: PORTABLE CHEST 1 VIEW COMPARISON:  11/14/2014 FINDINGS: Cardiomegaly, vascular congestion. Bilateral airspace disease. No effusions. No acute bony abnormality. IMPRESSION: Cardiomegaly. Bilateral airspace disease could reflect edema or infection. Electronically Signed   By: Rolm Baptise M.D.   On: 06/19/2022 22:02       The results of significant diagnostics from this hospitalization (including imaging, microbiology, ancillary and laboratory) are listed below for reference.     Microbiology: Recent Results (from the past 240 hour(s))  Resp Panel by RT-PCR (Flu A&B, Covid) Anterior Nasal Swab     Status: None   Collection Time: 06/20/22  1:48 PM  Specimen: Anterior Nasal Swab  Result Value Ref Range Status   SARS Coronavirus 2 by RT PCR NEGATIVE NEGATIVE Final    Comment: (NOTE) SARS-CoV-2 target nucleic acids are NOT DETECTED.  The SARS-CoV-2 RNA is generally detectable in upper respiratory specimens during the acute phase of infection. The lowest concentration of SARS-CoV-2 viral copies this assay can detect is 138 copies/mL. A negative result does not preclude SARS-Cov-2 infection and should not be used as the sole basis for treatment or other patient management decisions. A negative result may occur with  improper specimen collection/handling, submission of specimen other than nasopharyngeal swab, presence of viral mutation(s) within the areas targeted by this assay, and inadequate number of viral copies(<138 copies/mL). A negative result must be combined  with clinical observations, patient history, and epidemiological information. The expected result is Negative.  Fact Sheet for Patients:  EntrepreneurPulse.com.au  Fact Sheet for Healthcare Providers:  IncredibleEmployment.be  This test is no t yet approved or cleared by the [place] FDA and  has been authorized for detection and/or diagnosis of SARS-CoV-2 by FDA under an Emergency Use Authorization (EUA). This EUA will remain  in effect (meaning this test can be used) for the duration of the COVID-19 declaration under Section 564(b)(1) of the Act, 21 U.S.C.section 360bbb-3(b)(1), unless the authorization is terminated  or revoked sooner.       Influenza A by PCR NEGATIVE NEGATIVE Final   Influenza B by PCR NEGATIVE NEGATIVE Final    Comment: (NOTE) The Xpert Xpress SARS-CoV-2/FLU/RSV plus assay is intended as an aid in the diagnosis of influenza from Nasopharyngeal swab specimens and should not be used as a sole basis for treatment. Nasal washings and aspirates are unacceptable for Xpert Xpress SARS-CoV-2/FLU/RSV testing.  Fact Sheet for Patients: EntrepreneurPulse.com.au  Fact Sheet for Healthcare Providers: IncredibleEmployment.be  This test is not yet approved or cleared by the [place] FDA and has been authorized for detection and/or diagnosis of SARS-CoV-2 by FDA under an Emergency Use Authorization (EUA). This EUA will remain in effect (meaning this test can be used) for the duration of the COVID-19 declaration under Section 564(b)(1) of the Act, 21 U.S.C. section 360bbb-3(b)(1), unless the authorization is terminated or revoked.  Performed at [facility], [place] [address], [place], [place] [address]   MRSA Next Gen by PCR, Nasal     Status: None   Collection Time: 06/20/22  1:49 PM   Specimen: Anterior Nasal Swab  Result Value Ref Range Status   MRSA by PCR Next  Gen NOT DETECTED NOT DETECTED Final    Comment: (NOTE) The GeneXpert MRSA Assay (FDA approved for NASAL specimens only), is one component of a comprehensive MRSA colonization surveillance program. It is not intended to diagnose MRSA infection nor to guide or monitor treatment for MRSA infections. Test performance is not FDA approved in patients less than 72 years old. Performed at [facility], [place] [address], [place], [place] [address]   Culture, blood (Routine X 2) w Reflex to ID Panel     Status: None (Preliminary result)   Collection Time: 06/22/22  6:49 PM   Specimen: BLOOD RIGHT ARM  Result Value Ref Range Status   Specimen Description   Final    BLOOD RIGHT ARM Performed at [facility], [place] [address], [place], [place] [address]    Special Requests   Final    BOTTLES DRAWN AEROBIC AND ANAEROBIC Blood Culture adequate volume Performed at [facility], [place] [address] [place], [place] [address]    Culture   Final    NO GROWTH 3 DAYS Performed at [facility], [place] [address] [address] [place], [place] [address]    Report Status PENDING  Incomplete  Culture, blood (Routine X 2) w Reflex to ID Panel     Status: None (Preliminary result)   Collection Time: 06/22/22  6:50 PM   Specimen: BLOOD RIGHT HAND  Result Value Ref Range Status   Specimen Description   Final    BLOOD RIGHT HAND Performed at [facility], [place] [address], [place], [place] [address]    Special Requests   Final    BOTTLES DRAWN AEROBIC ONLY Blood Culture adequate volume Performed at [place] [address], [place], [place] [address]    Culture   Final    NO GROWTH 3 DAYS Performed at [facility], [place] [address], [place], [place] [address]    Report Status PENDING  Incomplete     Labs:  CBC: Recent Labs  Lab 06/19/22 2128 06/19/22 2135 06/21/22 0447 06/21/22 0937 06/21/22 0944 06/22/22 0255 06/23/22 0343  06/24/22 0337 06/25/22 0348  WBC 15.7*   < > 10.7*  --   --  9.3 10.0 8.7 8.4  NEUTROABS 13.4*  --   --   --   --   --   --   --   --   HGB 12.6*   < > 11.3*   < > 11.6* 11.2* 11.2* 11.7* 12.4*  HCT 42.0   < > 37.1*   < > 34.0* 35.7* 35.9* 38.0* 40.2  MCV 89.7   < > 85.7  --   --  85.4 84.3 85.4 84.5  PLT 336   < > 272  --   --  267 296 326 384   < > = values in this interval not displayed.   BMP &GFR Recent Labs  Lab 06/21/22 0043 06/21/22 0447 06/21/22 1547 06/22/22 0255 06/23/22 0343 06/24/22 0337 06/25/22 0348  NA 136   < > 138 137 136 138 136  K 3.8   < > 4.2 4.3 3.9 3.9 4.6  CL 103   < > 103 102 100 102 99  CO2 23   < > _0 GLUCOSE 116*   < > 114* 117* 117* 110* 171*  BUN 26*   < > 29* 31* 29* 34* 40*  CREATININE 1.71*   < > 1.63* 1.65* 1.55* 1.87* 1.57*  CALCIUM 8.5*   < > 8.8* 8.6* 8.6* 9.1 9.5  MG 1.9  --  2.5*  --  2.1 2.3 2.6*  PHOS 3.3  --   --   --  3.9 4.9* 5.2*   < > = values in this interval not displayed.   Estimated Creatinine Clearance: 54.9 mL/min (A) (by C-G formula based on SCr of 1.57 mg/dL (H)). Liver & Pancreas: Recent Labs  Lab 06/20/22 0005 06/20/22 0150 06/23/22 0343 06/24/22 0337 06/25/22 0348  AST 43* 58* 22  --   --   ALT _1 --   --   ALKPHOS 60 60 51  --   --   BILITOT 0.4 0.6 0.6  --   --   PROT 7.3 7.4 6.9  --   --   ALBUMIN 3.1* 3.3* 2.8* 2.9* 3.3*   No results for input(s): "LIPASE", "AMYLASE" in the last 168 hours. No results  for input(s): "AMMONIA" in the last 168 hours. Diabetic: No results for input(s): "HGBA1C" in the last 72 hours.  Recent Labs  Lab 06/20/22 0849 06/20/22 1149 06/20/22 1646  GLUCAP 91 90 104*   Cardiac Enzymes: No results for input(s): "CKTOTAL", "CKMB", "CKMBINDEX", "TROPONINI" in the last 168 hours. No results for input(s): "PROBNP" in the last 8760 hours. Coagulation Profile: No results for input(s): "INR", "PROTIME" in the last 168 hours. Thyroid Function Tests: No  results for input(s): "TSH", "T4TOTAL", "FREET4", "T3FREE", "THYROIDAB" in the last 72 hours. Lipid Profile: No results for input(s): "CHOL", "HDL", "LDLCALC", "TRIG", "CHOLHDL", "LDLDIRECT" in the last 72 hours. Anemia Panel: No results for input(s): "VITAMINB12", "FOLATE", "FERRITIN", "TIBC", "IRON", "RETICCTPCT" in the last 72 hours. Urine analysis: No results found for: "COLORURINE", "APPEARANCEUR", "LABSPEC", "PHURINE", "GLUCOSEU", "HGBUR", "BILIRUBINUR", "KETONESUR", "PROTEINUR", "UROBILINOGEN", "NITRITE", "LEUKOCYTESUR" Sepsis Labs: Invalid input(s): "PROCALCITONIN", "LACTICIDVEN"   SIGNED:  Mercy Riding, MD  [facility] 06/26/2022, 8:29 AM

## 2022-06-27 LAB — CULTURE, BLOOD (ROUTINE X 2)
Culture: NO GROWTH
Culture: NO GROWTH
Special Requests: ADEQUATE
Special Requests: ADEQUATE

## 2022-06-27 NOTE — Progress Notes (Signed)
Subjective:    Troy Singh - 51 y.o. male MRN [id number]  Date of birth: 01-Mar-1971  HPI  Daryan Buell is to establish care and hospital discharge follow-up.   Current issues and/or concerns: 06/19/2022 - 06/26/2022 [facility] per MD note: Recommendations for Outpatient Follow-up:  Follow up with PCP as below Outpatient follow-up with cardiology as below Outpatient heart monitor per cardiology. Check CMP and CBC in 1 to 2 weeks Reassess fluid status and adjust cardiac meds as appropriate Please follow up on the following pending results: None   Problems addressed during this hospitalization Principal Problem:   Hypertensive emergency Active Problems:   Elevated troponin   DM type 2, goal HbA1c < 7% (HCC)   Acute congestive heart failure (HCC)   DCM (dilated cardiomyopathy) (HCC)   Acute combined systolic and diastolic heart failure (HCC)   Coronary artery disease due to lipid rich plaque   Pure hypercholesterolemia   AKI (acute kidney injury) ([place])   CAP (community acquired pneumonia)   At risk for sleep apnea   Gout flare   Morbid obesity (HCC)   NSVT (nonsustained ventricular tachycardia) (HCC)   Fever   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  Likely due to acute CHF, hypertensive emergency, non-STEMI and CAP.  Cannot rule out sleep apnea. Respiratory failure resolved.   Acute combined CHF/cardiac amyloidosis/DCM: BNP elevated to 460. TTE with LVEF of 30 to 35%, GH, G3-DD.  R/LHC with nonobstructive CAD, NICM and elevated right and left heart pressures.  Nuclear scan suggests cardiac amyloidosis. Diuresed with IV Lasix 40 mg twice daily.  I and O incomplete but net 7.6 L.  Discharge weight 200.4 pounds.  BP and creatinine stable. -Discharge on p.o. Lasix, low-dose Coreg, Imdur and hydralazine. -Unfortunately, patient was not able to afford Jardiance.  -Cardiology to arrange outpatient  follow-up. -May need outpatient sleep study.   Intermittent bradycardia/NSVT: Not symptomatic. -Discharged on low-dose Coreg 3.125 mg twice daily after discussion with cardiology. -Consider outpatient sleep study   Hypertensive emergency: BP improved. -Cardiac/antihypertensive meds as above -Needs outpatient sleep study   Non-STEMI/nonobstructive CAD: R/LHC as above. -Cardiac meds as above -Continue aspirin and Lipitor   Community-acquired pneumonia: Pro-Cal elevated. -Completed 5 days of azithromycin and ceftriaxone in house.   AKI/CKD-3A: Unknown baseline but Cr 1.43 in 10/2021. Stable. Recent Labs (within last 365 days)              Recent Labs    06/20/22 0005 06/20/22 0150 06/20/22 0840 06/21/22 0043 06/21/22 0447 06/21/22 1547 06/22/22 0255 06/23/22 0343 06/24/22 0337 06/25/22 0348  BUN 25* 25* 24* 26* 24* 29* 31* 29* 34* 40*  CREATININE 1.67* 1.56* 1.42* 1.71* 1.55* 1.63* 1.65* 1.55* 1.87* 1.57*    -Recheck renal function at follow-up.   Acute gout flare in bilateral elbows: Likely from diuretics.  Reports history of gout.  Uric acid elevated to 11.  CRP and ESR elevated.  Continues to endorse left elbow pain.  X-ray with soft tissue swelling, small elbow effusion and possible osteochondral defect of capitulum.  Swelling and pain improved after starting prednisone. -Continue colchicine -P.o. prednisone 30 mg daily for 4 more days -Patient to start allopurinol after completing course of prednisone   At risk for sleep apnea -Needs outpatient sleep study   Noncompliance with medication: Patient has no PCP.  Not compliant with antihypertensive meds. -Counseled on the importance compliance with his medication and follow-up with PCP  Fever: Spiked fever to 101.8 the evening of 12/9.  Likely from gout.  Blood cultures NGTD.  Already on antibiotics for pneumonia.  No UTI symptoms.  No leukocytosis.  No further fever.   Morbid obesity Body mass index is 36.2  kg/m -Encourage lifestyle change to lose weight.  Follow-Ups  Follow up with [place] (Cardiology) in 22 days (07/18/2022); Hospital follow up appoinment 07/16/2022 @ 2 pm PLEASE bring a current medication list to appointment [place], off [employer]. Follow up with Primary Care at [facility]) in 2 days (06/28/2022); Hospital Follow up appointment is Friday December 15 at 10 am, you can apply for financial assistance when you establish Primary Care.  Today's visit 06/28/2022: Feeling improved since hospital discharge. Denies red flag symptoms. Reports taking all medications as prescribed and does not need any refills presently. Gout resolved. Trying to monitor what he eats. Concerned about eating too much salt. He is aware of his appointment with Cardiology on 07/16/2022. I discussed with patient to return to our office for a lab visit on next week and he was agreeable. He was provided with a CAFA application and I discussed next steps with him. Patient denies additional issues/concerns to discuss today.    ROS per HPI    Health Maintenance:  Health Maintenance Due  Topic Date Due   COVID-19 Vaccine (1) Never done   FOOT EXAM  Never done   OPHTHALMOLOGY EXAM  Never done   Diabetic kidney evaluation - Urine ACR  Never done   Hepatitis C Screening  Never done   DTaP/Tdap/Td (1 - Tdap) Never done   COLONOSCOPY (Pts 45-64yr Insurance coverage will need to be confirmed)  Never done     Past Medical History: Patient Active Problem List   Diagnosis Date Noted   Cardiac amyloidosis ([place] 06/24/2022   NSVT (nonsustained ventricular tachycardia) ([place] 06/23/2022   Fever 06/23/2022   Acute combined systolic and diastolic heart failure ([place] 06/22/2022   Coronary artery disease due to lipid rich plaque 06/22/2022   Pure hypercholesterolemia 06/22/2022   AKI (acute kidney injury) ([place] 06/22/2022    CAP (community acquired pneumonia) 06/22/2022   At risk for sleep apnea 06/22/2022   Gout flare 06/22/2022   Morbid obesity ([place] 06/22/2022   DCM (dilated cardiomyopathy) ([place] 06/21/2022   Hypertensive emergency 06/20/2022   Elevated troponin 06/20/2022   DM type 2, goal HbA1c < 7% ([facility] 06/20/2022   Acute congestive heart failure ([place] 06/20/2022    Social History   reports that he quit smoking about 12 months ago. His smoking use included cigarettes. He smoked an average of 1 pack per day. He has been exposed to tobacco smoke. He has never used smokeless tobacco. He reports that he does not drink alcohol and does not use drugs.   Family History  family history includes Diabetes in an other family member; Hypertension in an other family member; Hypertrophic cardiomyopathy in his mother.   Medications: reviewed and updated   Objective:   Physical Exam BP 138/71   Pulse 68   Temp 98.3 F (36.8 C)   Resp 16   Ht 5' 5.55" (1.665 m)   Wt 200 lb (90.7 kg)   SpO2 98%   BMI 32.72 kg/m   Physical Exam HENT:     Head: Normocephalic and atraumatic.  Eyes:     Extraocular Movements: Extraocular movements intact.     Conjunctiva/sclera: Conjunctivae normal.  Pupils: Pupils are equal, round, and reactive to light.  Cardiovascular:     Rate and Rhythm: Normal rate and regular rhythm.     Pulses: Normal pulses.     Heart sounds: Normal heart sounds.  Pulmonary:     Effort: Pulmonary effort is normal.     Breath sounds: Normal breath sounds.  Musculoskeletal:     Cervical back: Normal range of motion and neck supple.  Neurological:     General: No focal deficit present.     Mental Status: He is alert and oriented to person, place, and time.  Psychiatric:        Mood and Affect: Mood normal.        Behavior: Behavior normal.      Assessment & Plan:  1. Encounter to establish care 2. Hospital discharge follow-up - Reviewed hospital course, current medications, ensured  proper follow-up in place, and addressed concerns.  - Patient aware to return on next week for lab visit to obtain CMP and CBC.  - CMP14+EGFR; Future - CBC; Future  3. Hypertensive emergency 4. Elevated troponin 5. Acute congestive heart failure, unspecified heart failure type ([place]) 6. 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 Coronary artery disease due to lipid rich plaque 8. NSVT (nonsustained ventricular tachycardia) (HCC) 9. Pure hypercholesterolemia 10. Morbid obesity ([place]) - Continue present management.  - Keep all scheduled appointments with Cardiology.  - Referral to Medical Nutrition Therapy for further evaluation/management.  - Amb ref to Medical Nutrition Therapy-MNT  11. Type 2 diabetes mellitus without complication, without long-term current use of insulin (HCC) - Hemoglobin A1c 6.2% on 06/23/2022.  - Metformin as prescribed. Counseled on medication adherence/adverse effects.  - Discussed the importance of healthy eating habits, low-carbohydrate diet, low-sugar diet, regular aerobic exercise (at least 150 minutes a week as tolerated) and medication compliance to achieve or maintain control of diabetes. - Follow-up with primary provider in 4 weeks or sooner if needed.  - metFORMIN (GLUCOPHAGE-XR) 500 MG 24 hr tablet; Take 1 tablet (500 mg total) by mouth daily with breakfast.  Dispense: 30 tablet; Refill: 2  12. Community acquired pneumonia, unspecified laterality - Completed prescribed medications prior to hospital discharge.   13. AKI (acute kidney injury) ([place]) - Patient will return at later date for CMP (see # 1 and  2).   14. Acute drug-induced gout of elbow, unspecified laterality - Patient reports resolved.   15. Fever, unspecified fever cause - Temperature today in office normal.   16. At risk for sleep apnea - Referral for sleep study for further evaluation/management.  - PSG Sleep Study; Future  17. Financial difficulties - Offered patient [place] financial  discount/orange card and blue card application. Counseled patient will need to have an appointment with the financial counselor for processing of documentation. Patient agreeable.    Patient was given clear instructions to go to Emergency Department or return to medical center if symptoms don't improve, worsen, or new problems develop.The patient verbalized understanding.  I discussed the assessment and treatment plan with the patient. The patient was provided an opportunity to ask questions and all were answered. The patient agreed with the plan and demonstrated an understanding of the instructions.   The patient was advised to call back or seek an in-person evaluation if the symptoms worsen or if the condition fails to improve as anticipated.    Durene Fruits, NP 06/28/2022, 12:29 PM Primary Care at [facility]

## 2022-06-28 ENCOUNTER — Ambulatory Visit (INDEPENDENT_AMBULATORY_CARE_PROVIDER_SITE_OTHER): Payer: Self-pay | Admitting: Family

## 2022-06-28 ENCOUNTER — Telehealth: Payer: Self-pay | Admitting: Family

## 2022-06-28 ENCOUNTER — Encounter: Payer: Self-pay | Admitting: Family

## 2022-06-28 VITALS — BP 138/71 | HR 68 | Temp 98.3°F | Resp 16 | Ht 65.55 in | Wt 200.0 lb

## 2022-06-28 DIAGNOSIS — E119 Type 2 diabetes mellitus without complications: Secondary | ICD-10-CM

## 2022-06-28 DIAGNOSIS — N179 Acute kidney failure, unspecified: Secondary | ICD-10-CM

## 2022-06-28 DIAGNOSIS — R7989 Other specified abnormal findings of blood chemistry: Secondary | ICD-10-CM

## 2022-06-28 DIAGNOSIS — I161 Hypertensive emergency: Secondary | ICD-10-CM

## 2022-06-28 DIAGNOSIS — I2583 Coronary atherosclerosis due to lipid rich plaque: Secondary | ICD-10-CM

## 2022-06-28 DIAGNOSIS — Z9189 Other specified personal risk factors, not elsewhere classified: Secondary | ICD-10-CM

## 2022-06-28 DIAGNOSIS — Z7689 Persons encountering health services in other specified circumstances: Secondary | ICD-10-CM

## 2022-06-28 DIAGNOSIS — I509 Heart failure, unspecified: Secondary | ICD-10-CM

## 2022-06-28 DIAGNOSIS — E7800 Pure hypercholesterolemia, unspecified: Secondary | ICD-10-CM

## 2022-06-28 DIAGNOSIS — I4729 Other ventricular tachycardia: Secondary | ICD-10-CM

## 2022-06-28 DIAGNOSIS — I43 Cardiomyopathy in diseases classified elsewhere: Secondary | ICD-10-CM

## 2022-06-28 DIAGNOSIS — I2510 Atherosclerotic heart disease of native coronary artery without angina pectoris: Secondary | ICD-10-CM

## 2022-06-28 DIAGNOSIS — E854 Organ-limited amyloidosis: Secondary | ICD-10-CM

## 2022-06-28 DIAGNOSIS — M10229 Drug-induced gout, unspecified elbow: Secondary | ICD-10-CM

## 2022-06-28 DIAGNOSIS — J189 Pneumonia, unspecified organism: Secondary | ICD-10-CM

## 2022-06-28 DIAGNOSIS — Z599 Problem related to housing and economic circumstances, unspecified: Secondary | ICD-10-CM

## 2022-06-28 DIAGNOSIS — R509 Fever, unspecified: Secondary | ICD-10-CM

## 2022-06-28 DIAGNOSIS — Z09 Encounter for follow-up examination after completed treatment for conditions other than malignant neoplasm: Secondary | ICD-10-CM

## 2022-06-28 MED ORDER — METFORMIN HCL ER 500 MG PO TB24: 500.0000 mg | ORAL_TABLET | Freq: Every day | ORAL | 2 refills | Status: AC

## 2022-06-28 NOTE — Telephone Encounter (Signed)
Note signed 

## 2022-06-28 NOTE — Progress Notes (Signed)
./  Pt presents to establish care, and hospital f/u [facility] 12/6-12/13

## 2022-06-29 DIAGNOSIS — R001 Bradycardia, unspecified: Secondary | ICD-10-CM

## 2022-07-04 ENCOUNTER — Telehealth: Payer: Self-pay | Admitting: Family

## 2022-07-04 NOTE — Telephone Encounter (Signed)
Pt came in stating his job is asking for a note clearing him for work, pts last visit was on 12/15.

## 2022-07-05 NOTE — Telephone Encounter (Signed)
PCP is not in the office. Will have to f/u when PCP returns.

## 2022-07-09 NOTE — Telephone Encounter (Signed)
Make patient aware clearance to return to work to be determined by Cardiology. His is scheduled for an appointment with the same on 07/16/2022.

## 2022-07-09 NOTE — Telephone Encounter (Signed)
Advised to return call to PCE.  Will advise on the message from PCP regarding work note.  Msg per Ricky Stabs, NP (PCP) Make patient aware clearance to return to work to be determined by Cardiology. His is scheduled for an appointment with the same on 07/16/2022.

## 2022-07-11 NOTE — Telephone Encounter (Signed)
Patient states his discharge paperwork was not enough to provide to his employer. States job is not strenuous but was told he cannot report back to work until he has a note.   Aware that it is recommended by his PCP that his clearance come from Cardiology.   Patient aware of appt. Will follow up.

## 2022-07-15 NOTE — Progress Notes (Incomplete)
HEART & VASCULAR TRANSITION OF CARE CONSULT NOTE     Referring Physician: Primary Care: Primary Cardiologist:  HPI: Referred to clinic by *** for heart failure consultation. 52 y.o. male with history of DM II, HTN, hx cerebral infarcts d/t HTN,  tobacco abuse. He was admitted to [facility] 06/20/22 with acute respiratory failure requiring BiPAP and hypertensive emergency. HS troponin up to 1937. He was treated with nitro gtt and IV lasix. Cadiology consulted. Echo 06/20/22: EF 30-35%, grade III DD, RV okay. 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 He was started on GDMT which was limited by renal function and lack of insurance.   He is here today for hospital follow-up.    FH -  SH-   Cardiac Testing    Review of Systems: [y] = yes, [ ]  = no   General: Weight gain [ ] ; Weight loss [ ] ; Anorexia [ ] ; Fatigue [ ] ; Fever [ ] ; Chills [ ] ; Weakness [ ]   Cardiac: Chest pain/pressure [ ] ; Resting SOB [ ] ; Exertional SOB [ ] ; Orthopnea [ ] ; Pedal Edema [ ] ; Palpitations [ ] ; Syncope [ ] ; Presyncope [ ] ; Paroxysmal nocturnal dyspnea[ ]   Pulmonary: Cough [ ] ; Wheezing[ ] ; Hemoptysis[ ] ; Sputum [ ] ; Snoring [ ]   GI: Vomiting[ ] ; Dysphagia[ ] ; Melena[ ] ; Hematochezia [ ] ; Heartburn[ ] ; Abdominal pain [ ] ; Constipation [ ] ; Diarrhea [ ] ; BRBPR [ ]   GU: Hematuria[ ] ; Dysuria [ ] ; Nocturia[ ]   Vascular: Pain in legs with walking [ ] ; Pain in feet with lying flat [ ] ; Non-healing sores [ ] ; Stroke [ ] ; TIA [ ] ; Slurred speech [ ] ;  Neuro: Headaches[ ] ; Vertigo[ ] ; Seizures[ ] ; Paresthesias[ ] ;Blurred vision [ ] ; Diplopia [ ] ; Vision changes [ ]   Ortho/Skin: Arthritis [ ] ; Joint pain [ ] ; Muscle pain [ ] ; Joint swelling [ ] ; Back Pain [ ] ; Rash [ ]   Psych: Depression[ ] ; Anxiety[ ]   Heme: Bleeding problems [ ] ; Clotting disorders [ ] ; Anemia [ ]   Endocrine: Diabetes [ ] ; Thyroid dysfunction[  ]   Past Medical History:  Diagnosis Date   Diabetes mellitus without complication (HCC)    Gout    Hypertension     Current Outpatient Medications  Medication Sig Dispense Refill   acetaminophen (TYLENOL) 500 MG tablet Take 1,000 mg by mouth every 6 (six) hours as needed for mild pain.     allopurinol (ZYLOPRIM) 100 MG tablet Take 1 tablet (100 mg total) by mouth daily. 90 tablet 1   aspirin EC 81 MG tablet Take 1 tablet (81 mg total) by mouth daily. Swallow whole. 30 tablet 12   atorvastatin (LIPITOR) 40 MG tablet Take 1 tablet (40 mg total) by mouth daily. 90 tablet 1   carvedilol (COREG) 3.125 MG tablet Take 1 tablet (3.125 mg total) by mouth 2 (two) times daily with a meal. 180 tablet 1   colchicine 0.6 MG tablet Take 1 tablet (0.6 mg total) by mouth daily. 90 tablet 1   empagliflozin (JARDIANCE) 10 MG TABS tablet Take 1 tablet (10 mg total) by mouth daily. 90 tablet 1   furosemide (LASIX) 40 MG tablet Take 1 tablet (40 mg total) by mouth daily. 90 tablet 1   hydrALAZINE (APRESOLINE) 25 MG tablet Take 1 tablet (25 mg total) by mouth every 8 (eight) hours. 270 tablet 1   isosorbide mononitrate (  IMDUR) 30 MG 24 hr tablet Take 1 tablet (30 mg total) by mouth daily. 90 tablet 1   metFORMIN (GLUCOPHAGE-XR) 500 MG 24 hr tablet Take 1 tablet (500 mg total) by mouth daily with breakfast. 30 tablet 2   No current facility-administered medications for this visit.    No Known Allergies    Social History   Socioeconomic History   Marital status: Single    Spouse name: Not on file   Number of children: Not on file   Years of education: Not on file   Highest education level: Not on file  Occupational History   Not on file  Tobacco Use   Smoking status: Former    Packs/day: 1.00    Types: Cigarettes    Quit date: 05/31/2021    Years since quitting: 1.1    Passive exposure: Past   Smokeless tobacco: Never  Vaping Use   Vaping Use: Never used  Substance and Sexual Activity    Alcohol use: No    Alcohol/week: 0.0 standard drinks of alcohol   Drug use: No   Sexual activity: Yes  Other Topics Concern   Not on file  Social History Narrative   Not on file   Social Determinants of Health   Financial Resource Strain: Not on file  Food Insecurity: No Food Insecurity (06/20/2022)   Hunger Vital Sign    Worried About Running Out of Food in the Last Year: Never true    Ran Out of Food in the Last Year: Never true  Transportation Needs: No Transportation Needs (06/20/2022)   PRAPARE - Hydrologist (Medical): No    Lack of Transportation (Non-Medical): No  Physical Activity: Not on file  Stress: Not on file  Social Connections: Not on file  Intimate Partner Violence: Not At Risk (06/20/2022)   Humiliation, Afraid, Rape, and Kick questionnaire    Fear of Current or Ex-Partner: No    Emotionally Abused: No    Physically Abused: No    Sexually Abused: No      Family History  Problem Relation Age of Onset   Hypertrophic cardiomyopathy Mother    Diabetes Other    Hypertension Other     There were no vitals filed for this visit.  PHYSICAL EXAM: General:  Well appearing. No respiratory difficulty HEENT: normal Neck: supple. no JVD. Carotids 2+ bilat; no bruits. No lymphadenopathy or thryomegaly appreciated. Cor: PMI nondisplaced. Regular rate & rhythm. No rubs, gallops or murmurs. Lungs: clear Abdomen: soft, nontender, nondistended. No hepatosplenomegaly. No bruits or masses. Good bowel sounds. Extremities: no cyanosis, clubbing, rash, edema Neuro: alert & oriented x 3, cranial nerves grossly intact. moves all 4 extremities w/o difficulty. Affect pleasant.  ECG:   ASSESSMENT & PLAN: HFrEF/NICM NYHA *** GDMT  Diuretic- BB- Ace/ARB/ARNI MRA SGLT2i  Possible cardiac amyloidosis  HTN  Obesity  SDOH -Uninsured.   Referred to HFSW (PCP, Medications, Transportation, ETOH Abuse, Drug Abuse, Insurance, Financial ): Yes  or No Refer to Pharmacy: Yes or No Refer to Home Health: Yes on No Refer to Advanced Heart Failure Clinic: Yes or no  Refer to General Cardiology: Yes or No  Follow up

## 2022-07-16 ENCOUNTER — Ambulatory Visit (HOSPITAL_COMMUNITY)
Admit: 2022-07-16 | Discharge: 2022-07-16 | Disposition: A | Discharge status: Home / Self Care | Payer: Self-pay | Source: Ambulatory Visit | Attending: Physician Assistant | Admitting: Physician Assistant

## 2022-07-16 ENCOUNTER — Telehealth (HOSPITAL_COMMUNITY): Payer: Self-pay | Admitting: *Deleted

## 2022-07-16 ENCOUNTER — Other Ambulatory Visit (HOSPITAL_COMMUNITY): Payer: Self-pay

## 2022-07-16 ENCOUNTER — Encounter (HOSPITAL_COMMUNITY): Payer: Self-pay

## 2022-07-16 VITALS — BP 170/100 | HR 68 | Wt 208.0 lb

## 2022-07-16 DIAGNOSIS — Z87891 Personal history of nicotine dependence: Secondary | ICD-10-CM | POA: Insufficient documentation

## 2022-07-16 DIAGNOSIS — I10 Essential (primary) hypertension: Secondary | ICD-10-CM

## 2022-07-16 DIAGNOSIS — Z79899 Other long term (current) drug therapy: Secondary | ICD-10-CM | POA: Insufficient documentation

## 2022-07-16 DIAGNOSIS — I428 Other cardiomyopathies: Secondary | ICD-10-CM | POA: Insufficient documentation

## 2022-07-16 DIAGNOSIS — E119 Type 2 diabetes mellitus without complications: Secondary | ICD-10-CM | POA: Insufficient documentation

## 2022-07-16 DIAGNOSIS — I493 Ventricular premature depolarization: Secondary | ICD-10-CM | POA: Insufficient documentation

## 2022-07-16 DIAGNOSIS — I110 Hypertensive heart disease with heart failure: Secondary | ICD-10-CM | POA: Insufficient documentation

## 2022-07-16 DIAGNOSIS — R0683 Snoring: Secondary | ICD-10-CM | POA: Insufficient documentation

## 2022-07-16 DIAGNOSIS — Z7984 Long term (current) use of oral hypoglycemic drugs: Secondary | ICD-10-CM | POA: Insufficient documentation

## 2022-07-16 DIAGNOSIS — I5020 Unspecified systolic (congestive) heart failure: Secondary | ICD-10-CM | POA: Insufficient documentation

## 2022-07-16 DIAGNOSIS — Z8673 Personal history of transient ischemic attack (TIA), and cerebral infarction without residual deficits: Secondary | ICD-10-CM | POA: Insufficient documentation

## 2022-07-16 DIAGNOSIS — E669 Obesity, unspecified: Secondary | ICD-10-CM | POA: Insufficient documentation

## 2022-07-16 DIAGNOSIS — I2510 Atherosclerotic heart disease of native coronary artery without angina pectoris: Secondary | ICD-10-CM | POA: Insufficient documentation

## 2022-07-16 LAB — COMPREHENSIVE METABOLIC PANEL
ALT: 17 U/L (ref 0–44)
AST: 21 U/L (ref 15–41)
Albumin: 3.6 g/dL (ref 3.5–5.0)
Alkaline Phosphatase: 83 U/L (ref 38–126)
Anion gap: 7 (ref 5–15)
BUN: 20 mg/dL (ref 6–20)
CO2: 24 mmol/L (ref 22–32)
Calcium: 9.1 mg/dL (ref 8.9–10.3)
Chloride: 103 mmol/L (ref 98–111)
Creatinine, Ser: 1.35 mg/dL — ABNORMAL HIGH (ref 0.61–1.24)
GFR, Estimated: >60 mL/min (ref >60)
Glucose, Bld: 116 mg/dL — ABNORMAL HIGH (ref 70–99)
Potassium: 4.1 mmol/L (ref 3.5–5.1)
Sodium: 134 mmol/L — ABNORMAL LOW (ref 135–145)
Total Bilirubin: 0.6 mg/dL (ref 0.3–1.2)
Total Protein: 7.6 g/dL (ref 6.5–8.1)

## 2022-07-16 LAB — BRAIN NATRIURETIC PEPTIDE: B Natriuretic Peptide: 532.2 pg/mL — ABNORMAL HIGH (ref 0.0–100.0)

## 2022-07-16 MED ORDER — ISOSORBIDE MONONITRATE ER 30 MG PO TB24
30.0000 mg | ORAL_TABLET | Freq: Every day | ORAL | 3 refills | Status: DC
  Filled 2022-07-16 (×2): qty 30, 30d supply, fill #0
  Filled 2023-06-02: qty 30, 30d supply, fill #1
  Filled 2023-06-24 – 2023-07-03 (×2): qty 30, 30d supply, fill #2
  Filled 2023-07-15: qty 30, 30d supply, fill #3

## 2022-07-16 MED ORDER — FUROSEMIDE 40 MG PO TABS
40.0000 mg | ORAL_TABLET | Freq: Every day | ORAL | 3 refills | Status: DC
  Filled 2022-07-16 (×2): qty 30, 30d supply, fill #0
  Filled 2023-06-02: qty 30, 30d supply, fill #1
  Filled 2023-06-24 – 2023-06-26 (×2): qty 30, 30d supply, fill #2
  Filled 2023-07-15: qty 30, 30d supply, fill #3

## 2022-07-16 MED ORDER — HYDRALAZINE HCL 50 MG PO TABS
50.0000 mg | ORAL_TABLET | Freq: Three times a day (TID) | ORAL | 11 refills | Status: DC
  Filled 2022-07-16: qty 90, 30d supply, fill #0
  Filled 2023-06-24: qty 90, 30d supply, fill #1

## 2022-07-16 MED ORDER — CARVEDILOL 3.125 MG PO TABS
3.1250 mg | ORAL_TABLET | Freq: Two times a day (BID) | ORAL | 3 refills | Status: DC
  Filled 2022-07-16: qty 60, 30d supply, fill #0

## 2022-07-16 MED ORDER — ATORVASTATIN CALCIUM 40 MG PO TABS
40.0000 mg | ORAL_TABLET | Freq: Every day | ORAL | 3 refills | Status: AC
  Filled 2022-07-16 (×2): qty 30, 30d supply, fill #0
  Filled 2023-06-02: qty 30, 30d supply, fill #1
  Filled 2023-06-24 – 2023-07-03 (×2): qty 30, 30d supply, fill #2
  Filled 2023-07-15: qty 30, 30d supply, fill #3

## 2022-07-16 NOTE — Progress Notes (Signed)
  Heart and Vascular Care Navigation  07/16/2022  Troy Singh. 06/05/1971 [id number]  Reason for Referral: Patient seen in HF [facility] for SDoH screen Patient is participating in a Managed Medicaid Plan: no uninsured  Engaged with patient face to face for initial visit for Heart and Vascular Care Coordination.                                                                                                   Assessment: Patient is a 52 yo male who resides with his sister. He is working two part time jobs  and does not have any insurance benefits. He reports he is back to work at one of his jobs and hopeful to return to the second. He states that he had a [facility]. He denies any other SDoH concerns at this time.                               HRT/VAS Care Coordination     Patients Home Cardiology Office Heart Failure Clinic  HF [facility]   Outpatient Care Team Social Worker   Social Worker Name: Charmian Muff, [place] [phone]   Living arrangements for the past 2 months Apartment   Lives with: Siblings   Patient Has Concern With Paying Medical Bills Yes   Does Patient Have Prescription Coverage? No   Home Assistive Devices/Equipment Crutches       Social History:                                                                             SDOH Screenings   Food Insecurity: No Food Insecurity (06/20/2022)  Housing: Low Risk  (06/20/2022)  Transportation Needs: No Transportation Needs (06/20/2022)  Utilities: Not At Risk (06/20/2022)  Depression (PHQ2-9): Low Risk  (06/28/2022)  Financial Resource Strain: Medium Risk (07/16/2022)  Tobacco Use: Medium Risk (06/28/2022)    SDOH Interventions: Financial Resources:  Financial Strain Interventions: Intervention Not Indicated Occupational hygienist for [place] Insecurity:     Housing Insecurity:     Transportation:        Follow-up plan:  CSW provided patient with information regarding Medicaid  application for possible eligibility for the Medicaid expansion program and also encouraged him to make an appointment with the financial counselor to discuss CAFA. CSW provided patient with a CAFA application. Patient verbalize understanding of follow up and will retrun call if needed. [name], [place], [place]

## 2022-07-16 NOTE — Telephone Encounter (Signed)
Called to confirm Heart & Vascular Transitions of Care appointment at 2 pm on 07/16/22. Patient reminded to bring all medications and pill box organizer with them. Confirmed patient has transportation. Gave directions, instructed to utilize [place] parking.  Confirmed appointment prior to ending call.    Earnestine Leys, BSN, Clinical cytogeneticist Only

## 2022-07-16 NOTE — Progress Notes (Signed)
ReDS Vest / Clip - 07/16/22 1355       ReDS Vest / Clip   Station Marker A    Ruler Value 30    ReDS Value Range High volume overload    ReDS Actual Value 54

## 2022-07-16 NOTE — Patient Instructions (Addendum)
Primary care physician referral per [name], LCSW. Increase your Hydarlazine to 50 mg three times daily. Labs today - will call you if abnormal. Letter given for light duty in work environment.  Heart Failure medication refills sent to [facility] with [employer]. 30 day coupon provided for Jardiance. Our PharmD was contacted to apply for medication assistance from company.  Return to see Dr. Daniel Nones in [facility] in 3 weeks. Please call Heart Failure Clinic if any questions or concerns.

## 2022-07-17 ENCOUNTER — Telehealth (HOSPITAL_COMMUNITY): Payer: Self-pay

## 2022-07-17 ENCOUNTER — Other Ambulatory Visit (HOSPITAL_COMMUNITY): Payer: Self-pay

## 2022-07-17 NOTE — Telephone Encounter (Signed)
No answer, Left message to return call. ? ?

## 2022-07-17 NOTE — Telephone Encounter (Signed)
Undetermined time. No lifting > 25 lb, anticipate this for 2 months but not certain beyond that

## 2022-07-17 NOTE — Telephone Encounter (Signed)
Pam from the [facility] called wanting to know how long patient needed to have the work restrictions? (Ex. 74m,67m,32yr) please advise.    CB#[phone]

## 2022-07-18 ENCOUNTER — Other Ambulatory Visit: Payer: Self-pay | Admitting: Student

## 2022-07-18 ENCOUNTER — Other Ambulatory Visit (HOSPITAL_COMMUNITY): Payer: Self-pay

## 2022-07-18 DIAGNOSIS — M10229 Drug-induced gout, unspecified elbow: Secondary | ICD-10-CM

## 2022-07-18 MED ORDER — COLCHICINE 0.6 MG PO TABS
0.6000 mg | ORAL_TABLET | ORAL | 0 refills | Status: DC
  Filled 2022-07-18: qty 30, 60d supply, fill #0
  Filled 2022-07-18: qty 15, 30d supply, fill #0

## 2022-07-18 NOTE — Progress Notes (Signed)
Reduced colchicine dose to 0.6 mg every other day due to risk of toxicity with carvedilol and also reduced renal function.

## 2022-07-22 ENCOUNTER — Telehealth (HOSPITAL_COMMUNITY): Payer: Self-pay

## 2022-07-22 ENCOUNTER — Other Ambulatory Visit (HOSPITAL_BASED_OUTPATIENT_CLINIC_OR_DEPARTMENT_OTHER): Payer: Self-pay

## 2022-07-22 ENCOUNTER — Other Ambulatory Visit (HOSPITAL_COMMUNITY): Payer: Self-pay

## 2022-07-22 LAB — MULTIPLE MYELOMA PANEL, SERUM
Albumin SerPl Elph-Mcnc: 3.2 g/dL (ref 2.9–4.4)
Albumin/Glob SerPl: 0.9 (ref 0.7–1.7)
Alpha 1: 0.3 g/dL (ref 0.0–0.4)
Alpha2 Glob SerPl Elph-Mcnc: 1 g/dL (ref 0.4–1.0)
B-Globulin SerPl Elph-Mcnc: 1.2 g/dL (ref 0.7–1.3)
Gamma Glob SerPl Elph-Mcnc: 1.3 g/dL (ref 0.4–1.8)
Globulin, Total: 3.8 g/dL (ref 2.2–3.9)
IgA: 196 mg/dL (ref 90–386)
IgG (Immunoglobin G), Serum: 1506 mg/dL (ref 603–1613)
IgM (Immunoglobulin M), Srm: 89 mg/dL (ref 20–172)
Total Protein ELP: 7 g/dL (ref 6.0–8.5)

## 2022-07-22 MED ORDER — SPIRONOLACTONE 25 MG PO TABS
25.0000 mg | ORAL_TABLET | Freq: Every day | ORAL | 0 refills | Status: DC
  Filled 2022-07-22 – 2022-07-26 (×2): qty 30, 30d supply, fill #0
  Filled 2023-06-02: qty 30, 30d supply, fill #1
  Filled 2023-06-24: qty 30, 30d supply, fill #2

## 2022-07-22 NOTE — Telephone Encounter (Signed)
Patient aware and agreeable. Troy Singh sent in to pharmacy for him. He is going to call back to set up repeat labs.

## 2022-07-24 ENCOUNTER — Other Ambulatory Visit (HOSPITAL_COMMUNITY): Payer: Self-pay

## 2022-07-26 ENCOUNTER — Other Ambulatory Visit (HOSPITAL_COMMUNITY): Payer: Self-pay

## 2022-07-30 ENCOUNTER — Ambulatory Visit (HOSPITAL_COMMUNITY)
Admission: RE | Admit: 2022-07-30 | Discharge: 2022-07-30 | Disposition: A | Discharge status: Home / Self Care | Payer: Self-pay | Source: Ambulatory Visit | Attending: Cardiology | Admitting: Cardiology

## 2022-07-30 ENCOUNTER — Other Ambulatory Visit (HOSPITAL_COMMUNITY): Payer: Self-pay

## 2022-07-30 VITALS — BP 174/84 | HR 79 | Ht 65.5 in | Wt 208.8 lb

## 2022-07-30 DIAGNOSIS — E854 Organ-limited amyloidosis: Secondary | ICD-10-CM

## 2022-07-30 DIAGNOSIS — Z5986 Financial insecurity: Secondary | ICD-10-CM | POA: Insufficient documentation

## 2022-07-30 DIAGNOSIS — I5042 Chronic combined systolic (congestive) and diastolic (congestive) heart failure: Secondary | ICD-10-CM

## 2022-07-30 DIAGNOSIS — Z7984 Long term (current) use of oral hypoglycemic drugs: Secondary | ICD-10-CM | POA: Insufficient documentation

## 2022-07-30 DIAGNOSIS — Z79899 Other long term (current) drug therapy: Secondary | ICD-10-CM | POA: Insufficient documentation

## 2022-07-30 DIAGNOSIS — I2510 Atherosclerotic heart disease of native coronary artery without angina pectoris: Secondary | ICD-10-CM | POA: Insufficient documentation

## 2022-07-30 DIAGNOSIS — Z87891 Personal history of nicotine dependence: Secondary | ICD-10-CM | POA: Insufficient documentation

## 2022-07-30 DIAGNOSIS — I5022 Chronic systolic (congestive) heart failure: Secondary | ICD-10-CM | POA: Insufficient documentation

## 2022-07-30 DIAGNOSIS — I428 Other cardiomyopathies: Secondary | ICD-10-CM

## 2022-07-30 DIAGNOSIS — I110 Hypertensive heart disease with heart failure: Secondary | ICD-10-CM | POA: Insufficient documentation

## 2022-07-30 DIAGNOSIS — Z7982 Long term (current) use of aspirin: Secondary | ICD-10-CM | POA: Insufficient documentation

## 2022-07-30 DIAGNOSIS — I43 Cardiomyopathy in diseases classified elsewhere: Secondary | ICD-10-CM

## 2022-07-30 DIAGNOSIS — E119 Type 2 diabetes mellitus without complications: Secondary | ICD-10-CM | POA: Insufficient documentation

## 2022-07-30 DIAGNOSIS — I5020 Unspecified systolic (congestive) heart failure: Secondary | ICD-10-CM

## 2022-07-30 DIAGNOSIS — Z8249 Family history of ischemic heart disease and other diseases of the circulatory system: Secondary | ICD-10-CM | POA: Insufficient documentation

## 2022-07-30 DIAGNOSIS — Z833 Family history of diabetes mellitus: Secondary | ICD-10-CM | POA: Insufficient documentation

## 2022-07-30 LAB — BRAIN NATRIURETIC PEPTIDE: B Natriuretic Peptide: 516.6 pg/mL — ABNORMAL HIGH (ref 0.0–100.0)

## 2022-07-30 LAB — BASIC METABOLIC PANEL
Anion gap: 12 (ref 5–15)
BUN: 13 mg/dL (ref 6–20)
CO2: 25 mmol/L (ref 22–32)
Calcium: 9.3 mg/dL (ref 8.9–10.3)
Chloride: 101 mmol/L (ref 98–111)
Creatinine, Ser: 1.37 mg/dL — ABNORMAL HIGH (ref 0.61–1.24)
GFR, Estimated: >60 mL/min (ref >60)
Glucose, Bld: 98 mg/dL (ref 70–99)
Potassium: 3.8 mmol/L (ref 3.5–5.1)
Sodium: 138 mmol/L (ref 135–145)

## 2022-07-30 MED ORDER — CARVEDILOL 6.25 MG PO TABS
6.2500 mg | ORAL_TABLET | Freq: Two times a day (BID) | ORAL | 3 refills | Status: DC
  Filled 2022-07-30: qty 45, 23d supply, fill #0
  Filled 2023-06-02: qty 45, 23d supply, fill #1
  Filled 2023-06-24 – 2023-06-26 (×2): qty 45, 23d supply, fill #2
  Filled 2023-07-15: qty 45, 23d supply, fill #3

## 2022-07-30 MED ORDER — ENTRESTO 24-26 MG PO TABS
1.0000 | ORAL_TABLET | Freq: Two times a day (BID) | ORAL | 0 refills | Status: DC
  Filled 2022-07-30: qty 60, 30d supply, fill #0

## 2022-07-30 NOTE — Progress Notes (Signed)
Medication Samples have been provided to the patient.  Drug name: Jardiance       Strength: 10 mg        Qty: 3  LOT: [id number]  Exp.Date: 05/2024  Dosing instructions: Take 1 tablet daily   The patient has been instructed regarding the correct time, dose, and frequency of taking this medication, including desired effects and most common side effects.   Troy Singh 4:14 PM 07/30/2022

## 2022-07-30 NOTE — Patient Instructions (Addendum)
INCREASE Carvedilol 6.25 mg Twice daily  START Entresto 24/26 Twice daily  Labs done today, your results will be available in MyChart, we will contact you for abnormal readings.  Genetic test has been done, this has to be sent to [place] to be processed and can take 1-2 weeks to get results back.  We will let you know the results.  Please follow up with our heart failure pharmacist in 3 weeks  Your physician recommends that you schedule a follow-up appointment in: 2 months  If you have any questions or concerns before your next appointment please send [place] a message through [place] [place] or call our office at [phone].    TO LEAVE A MESSAGE FOR THE NURSE SELECT OPTION 2, PLEASE LEAVE A MESSAGE INCLUDING: YOUR NAME DATE OF BIRTH CALL BACK NUMBER REASON FOR CALL**this is important as we prioritize the call backs  YOU WILL RECEIVE A CALL BACK THE SAME DAY AS LONG AS YOU CALL BEFORE 4:00 PM  At the [facility], you and your health needs are our priority. As part of our continuing mission to provide you with exceptional heart care, we have created designated Provider Care Teams. These Care Teams include your primary Cardiologist (physician) and Advanced Practice Providers (APPs- Physician Assistants and Nurse Practitioners) who all work together to provide you with the care you need, when you need it.   You may see any of the following providers on your designated Care Team at your next follow up: Dr Glori Bickers Dr Loralie Champagne Dr. Roxana Hires, NP Lyda Jester, [facility] [place], [place] Forestine Na, NP Audry Riles, PharmD   Please be sure to bring in all your medications bottles to every appointment.

## 2022-07-30 NOTE — Progress Notes (Signed)
Blood collected for TTR genetic testing per Dr Daniel Nones.  Order form completed and both shipped by [facility] to [facility].

## 2022-07-30 NOTE — Progress Notes (Signed)
Medication Samples have been provided to the patient.  Drug name: Delene Loll       Strength: 24/26 mg        Qty: 2  LOT: [id number]  Exp.Date: 02/2024   Dosing instructions: take 1 tablet Twice daily   The patient has been instructed regarding the correct time, dose, and frequency of taking this medication, including desired effects and most common side effects.   Juanita Laster Tyrece Vanterpool 4:07 PM 07/30/2022

## 2022-07-30 NOTE — Progress Notes (Signed)
ADVANCED HEART FAILURE CLINIC NOTE  Referring Physician: Rema Fendt, NP  Primary Care: Rema Fendt, NP Primary Cardiologist: Dr. Gasper Lloyd  HPI: Troy Singh. is a 52 y.o. male with T2DM, HTN, hx of cerebral infarcts, HTN, HFrEF and hx of tobacco use presenting today to establish care. Troy Singh cardiac history dates back to December 2023 when he presented with acute respiratory failure and hypertensive emergency requiring BIPAP. During that admission he was diuresed, started on GDMT and had a TTE demonstrating LVEF of 35%. Due to significant LVH, he underwent PYP which was suggestive for amyloid.   Troy Singh states prior to his history of heart failure he worked 4 jobs without difficulty. He has always been very active with virtually no limitations. Since fall of 2023, he started to feel increasingly SOB until it became intolerable leading to admission. He also has a family history of HCM in his mother. Since starting GDMT, he does feel significantly better and has now resumed work once more.   Activity level/exercise tolerance:  NYHA II Orthopnea:  Sleeps on 2 pillows Paroxysmal noctural dyspnea:  No Chest pain/pressure:  No Orthostatic lightheadedness:  No Palpitations:  No Lower extremity edema:  No Presyncope/syncope:  No Cough:  No  Past Medical History:  Diagnosis Date   Diabetes mellitus without complication (HCC)    Gout    Hypertension     Current Outpatient Medications  Medication Sig Dispense Refill   acetaminophen (TYLENOL) 500 MG tablet Take 1,000 mg by mouth every 6 (six) hours as needed for mild pain.     allopurinol (ZYLOPRIM) 100 MG tablet Take 1 tablet (100 mg total) by mouth daily. 90 tablet 1   aspirin EC 81 MG tablet Take 1 tablet (81 mg total) by mouth daily. Swallow whole. 30 tablet 12   atorvastatin (LIPITOR) 40 MG tablet Take 1 tablet (40 mg total) by mouth daily. 90 tablet 3   carvedilol (COREG) 3.125 MG tablet Take 1 tablet (3.125 mg  total) by mouth 2 (two) times daily with a meal. 180 tablet 3   colchicine 0.6 MG tablet Take 1 tablet (0.6 mg total) by mouth every other day. 30 tablet 0   empagliflozin (JARDIANCE) 10 MG TABS tablet Take 1 tablet (10 mg total) by mouth daily. 90 tablet 1   furosemide (LASIX) 40 MG tablet Take 1 tablet (40 mg total) by mouth daily. 90 tablet 3   hydrALAZINE (APRESOLINE) 50 MG tablet Take 1 tablet (50 mg total) by mouth every 8 (eight) hours. 90 tablet 11   isosorbide mononitrate (IMDUR) 30 MG 24 hr tablet Take 1 tablet (30 mg total) by mouth daily. 90 tablet 3   metFORMIN (GLUCOPHAGE-XR) 500 MG 24 hr tablet Take 1 tablet (500 mg total) by mouth daily with breakfast. 30 tablet 2   spironolactone (ALDACTONE) 25 MG tablet Take 1 tablet (25 mg total) by mouth daily. 90 tablet 0   No current facility-administered medications for this encounter.    No Known Allergies    Social History   Socioeconomic History   Marital status: Single    Spouse name: Not on file   Number of children: Not on file   Years of education: Not on file   Highest education level: Not on file  Occupational History   Not on file  Tobacco Use   Smoking status: Former    Packs/day: 1.00    Types: Cigarettes    Quit date: 05/31/2021    Years since  quitting: 1.1    Passive exposure: Past   Smokeless tobacco: Never  Vaping Use   Vaping Use: Never used  Substance and Sexual Activity   Alcohol use: No    Alcohol/week: 0.0 standard drinks of alcohol   Drug use: No   Sexual activity: Yes  Other Topics Concern   Not on file  Social History Narrative   Not on file   Social Determinants of Health   Financial Resource Strain: Medium Risk (07/16/2022)   Overall Financial Resource Strain (CARDIA)    Difficulty of Paying Living Expenses: Somewhat hard  Food Insecurity: No Food Insecurity (06/20/2022)   Hunger Vital Sign    Worried About Running Out of Food in the Last Year: Never true    Ran Out of Food in the Last  Year: Never true  Transportation Needs: No Transportation Needs (06/20/2022)   PRAPARE - Hydrologist (Medical): No    Lack of Transportation (Non-Medical): No  Physical Activity: Not on file  Stress: Not on file  Social Connections: Not on file  Intimate Partner Violence: Not At Risk (06/20/2022)   Humiliation, Afraid, Rape, and Kick questionnaire    Fear of Current or Ex-Partner: No    Emotionally Abused: No    Physically Abused: No    Sexually Abused: No      Family History  Problem Relation Age of Onset   Hypertrophic cardiomyopathy Mother    Diabetes Other    Hypertension Other     PHYSICAL EXAM: Vitals:   07/30/22 1449  BP: (!) 174/84  Pulse: 79  SpO2: 97%   GENERAL: Well nourished, well developed, and in no apparent distress at rest.  HEENT: Negative for arcus senilis or xanthelasma. There is no scleral icterus.  The mucous membranes are pink and moist.   NECK: Supple, No masses. Normal carotid upstrokes without bruits. No masses or thyromegaly.    CHEST: There are no chest wall deformities. There is no chest wall tenderness. Respirations are unlabored.  Lungs- cta b/l CARDIAC:  JVP: 7 cm H2O         Normal S1, S2  Normal rate with regular rhythm. No murmurs, rubs or gallops.  Pulses are 2+ and symmetrical in upper and lower extremities. no edema.  ABDOMEN: Soft, non-tender, non-distended. There are no masses or hepatomegaly. There are normal bowel sounds.  EXTREMITIES: Warm and well perfused with no cyanosis, clubbing.  LYMPHATIC: No axillary or supraclavicular lymphadenopathy.  NEUROLOGIC: Patient is oriented x3 with no focal or lateralizing neurologic deficits.  PSYCH: Patients affect is appropriate, there is no evidence of anxiety or depression.  SKIN: Warm and dry; no lesions or wounds.   DATA REVIEWED  ECG:NSR  ECHO: 06/20/22: LVEF 35%, moderate LVH; Grade III DD  PYP: 06/24/22: Visual and quantitative assessment (grade 2,  H/CL = 1.19) is strongly suggestive of transthyretin amyloidosis.  CATH: 06/21/22:    Prox RCA lesion is 20% stenosed.   Prox LAD lesion is 40% stenosed.   Ost LM to Mid LM lesion is 20% stenosed.   Mild non-obstructive CAD Elevated right and left heart pressures Non-ischemic cardiomyopathy     ASSESSMENT & PLAN:  Heart failure with reduced EF Etiology of YT:[id number], with grade III DD (restrictive physiology). 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 Genetic testing sent off today. Mother does have history of HCM; will obtain CMR.  NYHA class / AHA Stage:II, significantly improved after starting GDMT Volume status &  Diuretics: Euvolemic, lasix 40mg  daily Vasodilators:Start entresto 24/26mg  BID; taking hydralazine 50 TID and Imdur 30mg ; if hypotensive we can reduced hydralazine doses. Will follow up with pharmacy for further med titration in 2-3 weeks.  Beta-Blocker:increase coreg to 6.25mg  BID ZOX:[id number] 25mg  daily Cardiometabolic:jardiance 10mg  Devices therapies & Valvulopathies:Not indicated at this time.  Advanced therapies:Starting patient assistance processes for Tafamadis.   2. Nonobstructive CAD - ASA 81mg  daily, Lipitor 80mg  daily  3. HTN - see above, starting Entresto today.   Mechelle Pates Advanced Heart Failure Mechanical Circulatory Support

## 2022-08-01 ENCOUNTER — Other Ambulatory Visit (HOSPITAL_COMMUNITY): Payer: Self-pay

## 2022-08-02 ENCOUNTER — Telehealth (HOSPITAL_COMMUNITY): Payer: Self-pay | Admitting: Pharmacy Technician

## 2022-08-02 NOTE — Telephone Encounter (Signed)
Advanced Heart Failure Patient Advocate Encounter  Called and left patient vm to start Jardiance and Entresto patient assistance.

## 2022-08-05 ENCOUNTER — Other Ambulatory Visit (HOSPITAL_COMMUNITY): Payer: Self-pay

## 2022-08-07 ENCOUNTER — Encounter: Payer: Self-pay | Attending: Family | Admitting: Skilled Nursing Facility1

## 2022-08-07 ENCOUNTER — Encounter: Payer: Self-pay | Admitting: Skilled Nursing Facility1

## 2022-08-07 VITALS — Ht 63.0 in | Wt 210.6 lb

## 2022-08-07 DIAGNOSIS — E119 Type 2 diabetes mellitus without complications: Secondary | ICD-10-CM | POA: Insufficient documentation

## 2022-08-07 DIAGNOSIS — I2583 Coronary atherosclerosis due to lipid rich plaque: Secondary | ICD-10-CM | POA: Insufficient documentation

## 2022-08-07 DIAGNOSIS — I2510 Atherosclerotic heart disease of native coronary artery without angina pectoris: Secondary | ICD-10-CM | POA: Insufficient documentation

## 2022-08-07 NOTE — Progress Notes (Signed)
Medical Nutrition Therapy   Primary concerns today: HF  Referral diagnosis: i50.9   NUTRITION ASSESSMENT   Clinical Medical Hx: HF Medications: see list Labs: A1C 6.2, creatinine 1.37, sodium 134, hemoglobin 12.4, magnesium 2.6 Notable Signs/Symptoms: none reported  Lifestyle & Dietary Hx  Pt arrives very motivated and eager to learn.   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  Pt states he has been trying to do a low sodium diet. Pt states he has been avoiding meat stating he feels that was the issue. Pt states he avoids all animal meats including seafood. Pt states he is also avoiding eggs and milk.  Pt states he is think about getting a almond cow to make his own milk and got a juicer.  Pt states he does still eat cheese.  Pt states his sister live with him and she is pretty supportive.  Pt states he believes the faucet water to be tainted.   Estimated daily fluid intake: 100+ oz Supplements: magnesium, multivitamin  Sleep: trouble due to mind not shutting off so not great sleep Stress / self-care: okay  Current average weekly physical activity: ADl's  24-Hr Dietary Recall First Meal: grapefruit and medicine Snack: triscuit and cheese Second Meal 12: grilled cheese Snack: grapes and chips Third Meal: chinese rice + mushroom Snack: ramen noodles without the packet Beverages: water, HiC, fruit punch, [place] tea  Estimated Energy Needs Calories: 1800    NUTRITION DIAGNOSIS  NB-1.1 Food and nutrition-related knowledge deficit As related to newly diagnosed HF.  As evidenced by new referral.   NUTRITION INTERVENTION  Nutrition education (E-1) on the following topics:  Heart disease and diet Plant based protein options Variety in diet to reduce micronutrient deficiencies  Vegetarian eating   Handouts Provided Include  Vegetarian diet plans  Learning Style & Readiness for Change Teaching method utilized: Visual & Auditory  Demonstrated degree of understanding  via: Teach Back  Barriers to learning/adherence to lifestyle change: none identified   Goals Established by Pt Avoid grapefruit with medicines Do not eat chinese food  Do not eat soy sauce   MONITORING & EVALUATION Dietary intake, weekly physical activity Next Steps  Patient is to call or email with any futrue questions or concerns .

## 2022-08-21 ENCOUNTER — Telehealth (HOSPITAL_COMMUNITY): Payer: Self-pay | Admitting: Vascular Surgery

## 2022-08-21 ENCOUNTER — Inpatient Hospital Stay (HOSPITAL_COMMUNITY)
Admission: RE | Admit: 2022-08-21 | Discharge: 2022-08-21 | Disposition: A | Discharge status: Home / Self Care | Payer: Self-pay | Source: Ambulatory Visit

## 2022-08-21 NOTE — Telephone Encounter (Signed)
Returned pt call to [facility] appt for today. Pt left message on schedule line staying he had family emergency

## 2022-08-21 NOTE — Progress Notes (Incomplete)
***  In Progress***    Advanced Heart Failure Clinic Note   Referring Physician: Camillia Herter, NP  Primary Care: Camillia Herter, NP HF Cardiologist: Dr. Daniel Nones  HPI:    Cassandra Mcmanaman. is a 52 y.o. male with T2DM, HTN, hx of cerebral infarcts, HTN, HFrEF and hx of tobacco use. Mr. Oelkers cardiac history dates back to December 2023 when he presented with acute respiratory failure and hypertensive emergency requiring BIPAP. During that admission he was diuresed, started on GDMT and had a TTE demonstrating LVEF of 35%. Cath showed nonobstructive coronaries. Due to significant LVH, he underwent PYP which was suggestive for amyloid.    Mr. Pichardo states prior to his history of heart failure he worked 4 jobs without difficulty. He has always been very active with virtually no limitations. Since Fall of 2023, he started to feel increasingly SOB until it became intolerable leading to admission. He also has a family history of HCM in his mother. Since starting GDMT, he does feel significantly better and has now resumed work once more.   Patient established care with Dr. Daniel Nones on 07/30/22 where he reported feeling well. Delene Loll was started and carvedilol was increased. Patient assistance for tafamidis was initiated. Genetic testing returned with [id number].   Today he returns to HF clinic for pharmacist medication titration. At last visit with MD ***.   Overall feeling ***. Dizziness, lightheadedness, fatigue:  Chest pain or palpitations:  How is your breathing?: *** SOB: Able to complete all ADLs. Activity level ***  Weight at home pounds. Takes furosemide/torsemide/bumex *** mg *** daily.  LEE PND/Orthopnea  Appetite *** Low-salt diet:   Physical Exam Cost/affordability of meds   HF Medications: Carvedilol 6.25 mg BID Entresto 24/26 mg BID Spironolactone 25 mg daily Jardiance 10 mg daily Hydralazine 50 mg TID + isosorbide mononitrate 30 mg daily  Has the patient been  experiencing any side effects to the medications prescribed?  {YES NO:22349}  Does the patient have any problems obtaining medications due to transportation or finances? Patient advocate attempting PAP enrollment for Entresto and Jardiance. Tafamidis?  Understanding of regimen: {excellent/good/fair/poor:19665} Understanding of indications: {excellent/good/fair/poor:19665} Potential of compliance: {excellent/good/fair/poor:19665} Patient understands to avoid NSAIDs. Patient understands to avoid decongestants.    Pertinent Lab Values (07/30/22): Serum creatinine 1.37, BUN 13, Potassium 3.8, Sodium 138, BNP 516.6,  Vital Signs: Weight: *** (last clinic weight: ***) Blood pressure: ***  Heart rate: ***   Assessment/Plan: Heart failure with reduced EF Etiology of YJ:[id number], with grade III DD (restrictive physiology). 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 Genetic testing sent 07/30/22. Mother does have history of HCM; will obtain CMR.  NYHA class / AHA Stage:II, significantly improved after starting GDMT Volume status & Diuretics: Euvolemic, lasix 40mg  daily Vasodilators:Start entresto 24/26mg  BID; taking hydralazine 50 TID and Imdur 30mg ; if hypotensive we can reduced hydralazine doses. Will follow up with pharmacy for further med titration in 2-3 weeks.  Beta-Blocker:increase coreg to 6.25mg  BID VZC:HYIFOYDXAJOINO 25mg  daily Cardiometabolic:jardiance 10mg  Devices therapies & Valvulopathies:Not indicated at this time.  Advanced therapies:Starting patient assistance processes for Tafamadis.    2. Nonobstructive CAD - ASA 81mg  daily, Lipitor 80mg  daily   3. HTN - see above, starting Entresto today.    Audry Riles, PharmD, BCPS, BCCP, CPP Heart Failure Clinic Pharmacist [phone]

## 2022-09-09 ENCOUNTER — Ambulatory Visit: Payer: Self-pay | Attending: Cardiology | Admitting: Cardiology

## 2022-09-24 ENCOUNTER — Encounter (HOSPITAL_COMMUNITY): Payer: Self-pay | Admitting: Cardiology

## 2022-10-28 ENCOUNTER — Other Ambulatory Visit (HOSPITAL_COMMUNITY): Payer: Self-pay | Admitting: Cardiology

## 2023-04-18 ENCOUNTER — Other Ambulatory Visit: Payer: Self-pay | Admitting: Family

## 2023-04-18 DIAGNOSIS — Z1211 Encounter for screening for malignant neoplasm of colon: Secondary | ICD-10-CM

## 2023-04-18 DIAGNOSIS — Z1212 Encounter for screening for malignant neoplasm of rectum: Secondary | ICD-10-CM

## 2023-05-29 ENCOUNTER — Emergency Department (HOSPITAL_COMMUNITY): Payer: Self-pay

## 2023-05-29 ENCOUNTER — Inpatient Hospital Stay (HOSPITAL_COMMUNITY)
Admission: EM | Admit: 2023-05-29 | Discharge: 2023-05-31 | DRG: 291 | Disposition: A | Discharge status: Home / Self Care | Payer: Self-pay | Attending: Internal Medicine | Admitting: Internal Medicine

## 2023-05-29 ENCOUNTER — Other Ambulatory Visit: Payer: Self-pay

## 2023-05-29 ENCOUNTER — Encounter (HOSPITAL_COMMUNITY): Payer: Self-pay | Admitting: Emergency Medicine

## 2023-05-29 DIAGNOSIS — Z87891 Personal history of nicotine dependence: Secondary | ICD-10-CM

## 2023-05-29 DIAGNOSIS — Z7984 Long term (current) use of oral hypoglycemic drugs: Secondary | ICD-10-CM

## 2023-05-29 DIAGNOSIS — Z5971 Insufficient health insurance coverage: Secondary | ICD-10-CM

## 2023-05-29 DIAGNOSIS — Z8249 Family history of ischemic heart disease and other diseases of the circulatory system: Secondary | ICD-10-CM

## 2023-05-29 DIAGNOSIS — I4729 Other ventricular tachycardia: Secondary | ICD-10-CM | POA: Diagnosis present

## 2023-05-29 DIAGNOSIS — Z79899 Other long term (current) drug therapy: Secondary | ICD-10-CM

## 2023-05-29 DIAGNOSIS — Z833 Family history of diabetes mellitus: Secondary | ICD-10-CM

## 2023-05-29 DIAGNOSIS — N179 Acute kidney failure, unspecified: Secondary | ICD-10-CM | POA: Diagnosis present

## 2023-05-29 DIAGNOSIS — Z8673 Personal history of transient ischemic attack (TIA), and cerebral infarction without residual deficits: Secondary | ICD-10-CM

## 2023-05-29 DIAGNOSIS — I2510 Atherosclerotic heart disease of native coronary artery without angina pectoris: Secondary | ICD-10-CM | POA: Diagnosis present

## 2023-05-29 DIAGNOSIS — Z7982 Long term (current) use of aspirin: Secondary | ICD-10-CM

## 2023-05-29 DIAGNOSIS — E854 Organ-limited amyloidosis: Secondary | ICD-10-CM | POA: Diagnosis present

## 2023-05-29 DIAGNOSIS — M109 Gout, unspecified: Secondary | ICD-10-CM | POA: Diagnosis present

## 2023-05-29 DIAGNOSIS — N1831 Chronic kidney disease, stage 3a: Secondary | ICD-10-CM | POA: Diagnosis present

## 2023-05-29 DIAGNOSIS — J810 Acute pulmonary edema: Secondary | ICD-10-CM | POA: Diagnosis present

## 2023-05-29 DIAGNOSIS — I428 Other cardiomyopathies: Secondary | ICD-10-CM | POA: Diagnosis present

## 2023-05-29 DIAGNOSIS — I130 Hypertensive heart and chronic kidney disease with heart failure and stage 1 through stage 4 chronic kidney disease, or unspecified chronic kidney disease: Principal | ICD-10-CM | POA: Diagnosis present

## 2023-05-29 DIAGNOSIS — Z91148 Patient's other noncompliance with medication regimen for other reason: Secondary | ICD-10-CM

## 2023-05-29 DIAGNOSIS — I161 Hypertensive emergency: Principal | ICD-10-CM | POA: Diagnosis present

## 2023-05-29 DIAGNOSIS — I5023 Acute on chronic systolic (congestive) heart failure: Secondary | ICD-10-CM | POA: Diagnosis present

## 2023-05-29 DIAGNOSIS — E1122 Type 2 diabetes mellitus with diabetic chronic kidney disease: Secondary | ICD-10-CM | POA: Diagnosis present

## 2023-05-29 DIAGNOSIS — I509 Heart failure, unspecified: Secondary | ICD-10-CM

## 2023-05-29 LAB — CBC WITH DIFFERENTIAL/PLATELET
Abs Immature Granulocytes: 0.01 10*3/uL (ref 0.00–0.07)
Basophils Absolute: 0 10*3/uL (ref 0.0–0.1)
Basophils Relative: 1 %
Eosinophils Absolute: 0.6 10*3/uL — ABNORMAL HIGH (ref 0.0–0.5)
Eosinophils Relative: 9 %
HCT: 35.5 % — ABNORMAL LOW (ref 39.0–52.0)
Hemoglobin: 10.9 g/dL — ABNORMAL LOW (ref 13.0–17.0)
Immature Granulocytes: 0 %
Lymphocytes Relative: 24 %
Lymphs Abs: 1.5 10*3/uL (ref 0.7–4.0)
MCH: 26.2 pg (ref 26.0–34.0)
MCHC: 30.7 g/dL (ref 30.0–36.0)
MCV: 85.3 fL (ref 80.0–100.0)
Monocytes Absolute: 0.4 10*3/uL (ref 0.1–1.0)
Monocytes Relative: 6 %
Neutro Abs: 3.8 10*3/uL (ref 1.7–7.7)
Neutrophils Relative %: 60 %
Platelets: 230 10*3/uL (ref 150–400)
RBC: 4.16 MIL/uL — ABNORMAL LOW (ref 4.22–5.81)
RDW: 17 % — ABNORMAL HIGH (ref 11.5–15.5)
WBC: 6.2 10*3/uL (ref 4.0–10.5)
nRBC: 0 % (ref 0.0–0.2)

## 2023-05-29 LAB — COMPREHENSIVE METABOLIC PANEL
ALT: 20 U/L (ref 0–44)
AST: 26 U/L (ref 15–41)
Albumin: 3.6 g/dL (ref 3.5–5.0)
Alkaline Phosphatase: 85 U/L (ref 38–126)
Anion gap: 11 (ref 5–15)
BUN: 30 mg/dL — ABNORMAL HIGH (ref 6–20)
CO2: 20 mmol/L — ABNORMAL LOW (ref 22–32)
Calcium: 9 mg/dL (ref 8.9–10.3)
Chloride: 106 mmol/L (ref 98–111)
Creatinine, Ser: 1.77 mg/dL — ABNORMAL HIGH (ref 0.61–1.24)
GFR, Estimated: 45 mL/min — ABNORMAL LOW (ref >60)
Glucose, Bld: 174 mg/dL — ABNORMAL HIGH (ref 70–99)
Potassium: 3.7 mmol/L (ref 3.5–5.1)
Sodium: 137 mmol/L (ref 135–145)
Total Bilirubin: 0.9 mg/dL (ref <1.2)
Total Protein: 7.4 g/dL (ref 6.5–8.1)

## 2023-05-29 LAB — I-STAT CHEM 8, ED
BUN: 28 mg/dL — ABNORMAL HIGH (ref 6–20)
Calcium, Ion: 1.19 mmol/L (ref 1.15–1.40)
Chloride: 107 mmol/L (ref 98–111)
Creatinine, Ser: 1.8 mg/dL — ABNORMAL HIGH (ref 0.61–1.24)
Glucose, Bld: 138 mg/dL — ABNORMAL HIGH (ref 70–99)
HCT: 34 % — ABNORMAL LOW (ref 39.0–52.0)
Hemoglobin: 11.6 g/dL — ABNORMAL LOW (ref 13.0–17.0)
Potassium: 3.8 mmol/L (ref 3.5–5.1)
Sodium: 141 mmol/L (ref 135–145)
TCO2: 24 mmol/L (ref 22–32)

## 2023-05-29 LAB — TROPONIN I (HIGH SENSITIVITY): Troponin I (High Sensitivity): 25 ng/L — ABNORMAL HIGH (ref <18)

## 2023-05-29 LAB — BRAIN NATRIURETIC PEPTIDE: B Natriuretic Peptide: 1762.8 pg/mL — ABNORMAL HIGH (ref 0.0–100.0)

## 2023-05-29 MED ORDER — NITROGLYCERIN 2 % TD OINT
1.0000 [in_us] | TOPICAL_OINTMENT | Freq: Once | TRANSDERMAL | Status: AC
  Administered 2023-05-29: 1 [in_us] via TOPICAL
  Filled 2023-05-29: qty 1

## 2023-05-29 MED ORDER — FUROSEMIDE 10 MG/ML IJ SOLN
40.0000 mg | Freq: Once | INTRAMUSCULAR | Status: AC
  Administered 2023-05-29: 40 mg via INTRAVENOUS
  Filled 2023-05-29: qty 4

## 2023-05-29 MED ORDER — HYDRALAZINE HCL 20 MG/ML IJ SOLN
10.0000 mg | Freq: Once | INTRAMUSCULAR | Status: AC
  Administered 2023-05-29: 10 mg via INTRAVENOUS
  Filled 2023-05-29: qty 1

## 2023-05-29 NOTE — ED Provider Notes (Signed)
[place] EMERGENCY DEPARTMENT AT [facility] Provider Note  CSN: [id number] Arrival date & time: 05/29/23 2244  Chief Complaint(s) Shortness of Breath  HPI Troy Singh. is a 52 y.o. male with a past medical history listed below who presents to the emergency department with rapidly worsening shortness of breath that began 30 minutes prior to arrival.  Patient reports being on his way to work when he began having the shortness of breath which rapidly worsened.  He denies any recent fevers or infections.  No coughing or congestion.  He endorses chest tightness described as not being able to take a big deep breath.  No peripheral swelling.  No abdominal distention.  Patient does admit to not being compliant with medications due to financial constraints.  Patient's last echo was December 2023 showing an EF of 30 to 35% with grade 3 diastolic dysfunction.  Catheterization showed mild CAD with nonischemic cardiomyopathy.  The history is provided by the patient.    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   NSVT (nonsustained ventricular tachycardia) (HCC) 06/23/2022   Fever 06/23/2022   Acute combined systolic and diastolic heart failure (HCC) 06/22/2022   Coronary artery disease due to lipid rich plaque 06/22/2022   Pure hypercholesterolemia 06/22/2022   AKI (acute kidney injury) (HCC) 06/22/2022   CAP (community acquired pneumonia) 06/22/2022   At risk for sleep apnea 06/22/2022   Gout flare 06/22/2022   Morbid obesity (HCC) 06/22/2022   DCM (dilated cardiomyopathy) (HCC) 06/21/2022   Hypertensive emergency 06/20/2022   Elevated troponin 06/20/2022   DM type 2, goal HbA1c < 7% (HCC) 06/20/2022   Acute congestive heart failure (HCC) 06/20/2022   Home Medication(s) Prior to Admission  medications   Medication Sig Start Date End Date Taking? Authorizing Provider  acetaminophen (TYLENOL) 500 MG tablet Take 1,000 mg by mouth every 6 (six) hours as needed for mild pain.    [provider]  allopurinol (ZYLOPRIM) 100 MG tablet Take 1 tablet (100 mg total) by mouth daily. 06/29/22 12/26/22  Almon Hercules, MD  aspirin EC 81 MG tablet Take 1 tablet (81 mg total) by mouth daily. Swallow whole. 06/26/22   Almon Hercules, MD  atorvastatin (LIPITOR) 40 MG tablet Take 1 tablet (40 mg total) by mouth daily. 07/16/22   Andrey Farmer, PA-C  carvedilol (COREG) 6.25 MG tablet Take 1 tablet (6.25 mg total) by mouth 2 (two) times daily with a meal. 07/30/22   Sabharwal, Aditya, DO  colchicine 0.6 MG tablet Take 1 tablet (0.6 mg total) by mouth every other day. 07/18/22 09/16/22  Almon Hercules, MD  furosemide (LASIX) 40 MG tablet Take 1 tablet (40 mg total) by mouth daily. 07/16/22   Andrey Farmer, PA-C  hydrALAZINE (APRESOLINE) 50 MG tablet Take 1 tablet (50 mg total) by mouth every 8 (eight) hours. 07/16/22   Andrey Farmer, PA-C  isosorbide mononitrate (IMDUR) 30 MG 24 hr tablet Take 1 tablet (30 mg total) by mouth daily. 07/16/22   Andrey Farmer, PA-C  metFORMIN (GLUCOPHAGE-XR) 500 MG 24 hr tablet Take 1 tablet (500 mg total) by mouth daily with breakfast. 06/28/22 09/26/22  Rema Fendt, NP  sacubitril-valsartan (ENTRESTO) 24-26 MG Take 1 tablet by mouth 2 (two) times daily. 07/30/22  Sabharwal, Aditya, DO  spironolactone (ALDACTONE) 25 MG tablet Take 1 tablet (25 mg total) by mouth daily. 07/22/22 10/20/22  Andrey Farmer, PA-C                                                                                                                                    Allergies Patient has no known allergies.  Review of Systems Review of Systems As noted in HPI  Physical Exam Vital Signs  I have reviewed the triage vital signs BP (!) 173/114   Pulse 77   Temp 98.2 F  (36.8 C) (Oral)   Resp 13   Ht 5\' 3"  (1.6 m)   Wt 72.6 kg   SpO2 (!) 75%   BMI 28.34 kg/m   Physical Exam Vitals reviewed.  Constitutional:      General: He is not in acute distress.    Appearance: He is well-developed. He is not diaphoretic.  HENT:     Head: Normocephalic and atraumatic.     Nose: Nose normal.  Eyes:     General: No scleral icterus.       Right eye: No discharge.        Left eye: No discharge.     Conjunctiva/sclera: Conjunctivae normal.     Pupils: Pupils are equal, round, and reactive to light.  Cardiovascular:     Rate and Rhythm: Normal rate and regular rhythm.     Heart sounds: No murmur heard.    No friction rub. No gallop.  Pulmonary:     Effort: Tachypnea and respiratory distress present.     Breath sounds: No stridor. Examination of the right-middle field reveals rales. Examination of the left-middle field reveals rales. Examination of the right-lower field reveals rales. Examination of the left-lower field reveals rales. Rales (fine) present.  Abdominal:     General: There is no distension.     Palpations: Abdomen is soft.     Tenderness: There is no abdominal tenderness.  Musculoskeletal:        General: No tenderness.     Cervical back: Normal range of motion and neck supple.  Skin:    General: Skin is warm and dry.     Findings: No erythema or rash.  Neurological:     Mental Status: He is alert and oriented to person, place, and time.     ED Results and Treatments Labs (all labs ordered are listed, but only abnormal results are displayed) Labs Reviewed  CBC WITH DIFFERENTIAL/PLATELET - Abnormal; Notable for the following components:      Result Value   RBC 4.16 (*)    Hemoglobin 10.9 (*)    HCT 35.5 (*)    RDW 17.0 (*)    Eosinophils Absolute 0.6 (*)    All other components within normal limits  COMPREHENSIVE METABOLIC PANEL - Abnormal; Notable for the following components:   CO2 20 (*)    Glucose,  Bld 174 (*)    BUN 30 (*)     Creatinine, Ser 1.77 (*)    GFR, Estimated 45 (*)    All other components within normal limits  BRAIN NATRIURETIC PEPTIDE - Abnormal; Notable for the following components:   B Natriuretic Peptide 1,762.8 (*)    All other components within normal limits  I-STAT CHEM 8, ED - Abnormal; Notable for the following components:   BUN 28 (*)    Creatinine, Ser 1.80 (*)    Glucose, Bld 138 (*)    Hemoglobin 11.6 (*)    HCT 34.0 (*)    All other components within normal limits  TROPONIN I (HIGH SENSITIVITY) - Abnormal; Notable for the following components:   Troponin I (High Sensitivity) 25 (*)    All other components within normal limits                                                                                                                         EKG  EKG Interpretation Date/Time:  Thursday May 29 2023 22:51:49 EST Ventricular Rate:  84 PR Interval:  217 QRS Duration:  135 QT Interval:  427 QTC Calculation: 505 R Axis:   -75  Text Interpretation: Sinus rhythm Prolonged PR interval Probable left atrial enlargement Nonspecific IVCD with LAD Left ventricular hypertrophy No acute changes Confirmed by Drema Pry [phone]) on 05/29/2023 11:24:24 PM       Radiology No results found.  Medications Ordered in ED Medications  nitroGLYCERIN (NITROGLYN) 2 % ointment 1 inch (1 inch Topical Given 05/29/23 2333)  hydrALAZINE (APRESOLINE) injection 10 mg (10 mg Intravenous Given 05/29/23 2333)  furosemide (LASIX) injection 40 mg (40 mg Intravenous Given 05/29/23 2333)   Procedures .Critical Care  Performed by: Nira Conn, MD Authorized by: Nira Conn, MD   Critical care provider statement:    Critical care time (minutes):  42   Critical care time was exclusive of:  Separately billable procedures and treating other patients   Critical care was necessary to treat or prevent imminent or life-threatening deterioration of the following conditions:  Cardiac  failure and circulatory failure   Critical care was time spent personally by me on the following activities:  Development of treatment plan with patient or surrogate, discussions with consultants, evaluation of patient's response to treatment, examination of patient, obtaining history from patient or surrogate, review of old charts, re-evaluation of patient's condition, pulse oximetry, ordering and review of radiographic studies, ordering and review of laboratory studies and ordering and performing treatments and interventions   Care discussed with: admitting provider   .1-3 Lead EKG Interpretation  Performed by: Nira Conn, MD Authorized by: Nira Conn, MD     Interpretation: normal     ECG rate:  72   ECG rate assessment: normal     Rhythm: sinus rhythm     Ectopy: PVCs     Conduction: normal     (including critical care time) Medical  Decision Making / ED Course   Medical Decision Making Amount and/or Complexity of Data Reviewed Labs: ordered. Decision-making details documented in ED Course. Radiology: ordered and independent interpretation performed. Decision-making details documented in ED Course. ECG/medicine tests: ordered and independent interpretation performed. Decision-making details documented in ED Course.  Risk Prescription drug management. Decision regarding hospitalization.    Presentation is most suspicious for hypertensive emergency and flash pulmonary edema. Patient placed on nonrebreather, sats 99%. EKG without acute ischemic changes, dysrhythmias or blocks. Chest x-ray consistent with pulmonary edema.  Patient provided with nitroglycerin paste, IV hydralazine and Lasix.  CBC without leukocytosis.  Mild anemia with relatively stable hemoglobin. CMP without significant electrolyte derangements.  Mild AKI.  Mild hyperglycemia without DKA. Initial troponin slightly elevated at 25.  Likely demand. Unlikely ACS. BNP greater than  1700.  Workup is consistent with hypertensive emergency/CHF exacerbation.  On reassessment, patient's blood pressure significantly improved with systolics in the 140s. Lungs are now clear. Will transition from nonrebreather to nasal cannula.  I spoke with Dr. Gasper Sells from hospitalist service who agreed to admit patient for further workup and management.      Final Clinical Impression(s) / ED Diagnoses Final diagnoses:  Hypertensive emergency    This chart was dictated using voice recognition software.  Despite best efforts to proofread,  errors can occur which can change the documentation meaning.    Nira Conn, MD 05/30/23 [phone]

## 2023-05-29 NOTE — ED Triage Notes (Signed)
Patient complaining of sudden onset of shortness of breath tonight. Denies chest pain. States he has a hx of heart failure but has been out of his lasix for a month or two. Room air saturation 70% in triage. Spo2 75% on 6L. NRB placed and spo2 improved to 94%. Patient transferred into room.

## 2023-05-30 ENCOUNTER — Inpatient Hospital Stay (HOSPITAL_COMMUNITY): Payer: Self-pay

## 2023-05-30 ENCOUNTER — Encounter (HOSPITAL_COMMUNITY): Payer: Self-pay | Admitting: Internal Medicine

## 2023-05-30 DIAGNOSIS — I5041 Acute combined systolic (congestive) and diastolic (congestive) heart failure: Secondary | ICD-10-CM

## 2023-05-30 DIAGNOSIS — I43 Cardiomyopathy in diseases classified elsewhere: Secondary | ICD-10-CM

## 2023-05-30 DIAGNOSIS — J810 Acute pulmonary edema: Secondary | ICD-10-CM

## 2023-05-30 DIAGNOSIS — I161 Hypertensive emergency: Secondary | ICD-10-CM

## 2023-05-30 DIAGNOSIS — E854 Organ-limited amyloidosis: Secondary | ICD-10-CM

## 2023-05-30 DIAGNOSIS — I5023 Acute on chronic systolic (congestive) heart failure: Secondary | ICD-10-CM

## 2023-05-30 DIAGNOSIS — E1122 Type 2 diabetes mellitus with diabetic chronic kidney disease: Secondary | ICD-10-CM

## 2023-05-30 LAB — BASIC METABOLIC PANEL
Anion gap: 9 (ref 5–15)
BUN: 31 mg/dL — ABNORMAL HIGH (ref 6–20)
CO2: 23 mmol/L (ref 22–32)
Calcium: 9.1 mg/dL (ref 8.9–10.3)
Chloride: 106 mmol/L (ref 98–111)
Creatinine, Ser: 1.59 mg/dL — ABNORMAL HIGH (ref 0.61–1.24)
GFR, Estimated: 52 mL/min — ABNORMAL LOW (ref >60)
Glucose, Bld: 114 mg/dL — ABNORMAL HIGH (ref 70–99)
Potassium: 4 mmol/L (ref 3.5–5.1)
Sodium: 138 mmol/L (ref 135–145)

## 2023-05-30 LAB — CBC
HCT: 33.6 % — ABNORMAL LOW (ref 39.0–52.0)
Hemoglobin: 10.2 g/dL — ABNORMAL LOW (ref 13.0–17.0)
MCH: 25.4 pg — ABNORMAL LOW (ref 26.0–34.0)
MCHC: 30.4 g/dL (ref 30.0–36.0)
MCV: 83.6 fL (ref 80.0–100.0)
Platelets: 238 10*3/uL (ref 150–400)
RBC: 4.02 MIL/uL — ABNORMAL LOW (ref 4.22–5.81)
RDW: 16.9 % — ABNORMAL HIGH (ref 11.5–15.5)
WBC: 7.7 10*3/uL (ref 4.0–10.5)
nRBC: 0 % (ref 0.0–0.2)

## 2023-05-30 LAB — GLUCOSE, CAPILLARY
Glucose-Capillary: 116 mg/dL — ABNORMAL HIGH (ref 70–99)
Glucose-Capillary: 113 mg/dL — ABNORMAL HIGH (ref 70–99)
Glucose-Capillary: 106 mg/dL — ABNORMAL HIGH (ref 70–99)
Glucose-Capillary: 94 mg/dL (ref 70–99)

## 2023-05-30 LAB — ECHOCARDIOGRAM COMPLETE
AR max vel: 3.09 cm2
AV Area VTI: 2.82 cm2
AV Area mean vel: 2.83 cm2
AV Mean grad: 4 mm[Hg]
AV Peak grad: 8 mm[Hg]
Ao pk vel: 1.41 m/s
Area-P 1/2: 4.41 cm2
S' Lateral: 4.9 cm

## 2023-05-30 LAB — HEMOGLOBIN A1C
Hgb A1c MFr Bld: 5.8 % — ABNORMAL HIGH (ref 4.8–5.6)
Mean Plasma Glucose: 119.76 mg/dL

## 2023-05-30 LAB — MAGNESIUM: Magnesium: 1.9 mg/dL (ref 1.7–2.4)

## 2023-05-30 LAB — TSH: TSH: 2.852 u[IU]/mL (ref 0.350–4.500)

## 2023-05-30 LAB — MRSA NEXT GEN BY PCR, NASAL: MRSA by PCR Next Gen: NOT DETECTED

## 2023-05-30 LAB — TROPONIN I (HIGH SENSITIVITY): Troponin I (High Sensitivity): 31 ng/L — ABNORMAL HIGH (ref <18)

## 2023-05-30 MED ORDER — FUROSEMIDE 10 MG/ML IJ SOLN
40.0000 mg | Freq: Two times a day (BID) | INTRAMUSCULAR | Status: DC
  Administered 2023-05-30 – 2023-05-31 (×2): 40 mg via INTRAVENOUS
  Filled 2023-05-30 (×2): qty 4

## 2023-05-30 MED ORDER — CHLORHEXIDINE GLUCONATE CLOTH 2 % EX PADS
6.0000 | MEDICATED_PAD | Freq: Every day | CUTANEOUS | Status: DC
  Administered 2023-05-30: 6 via TOPICAL

## 2023-05-30 MED ORDER — ACETAMINOPHEN 650 MG RE SUPP: 650.0000 mg | Freq: Four times a day (QID) | RECTAL | Status: DC | PRN

## 2023-05-30 MED ORDER — ACETAMINOPHEN 325 MG PO TABS: 650.0000 mg | ORAL_TABLET | Freq: Four times a day (QID) | ORAL | Status: DC | PRN

## 2023-05-30 MED ORDER — ENOXAPARIN SODIUM 40 MG/0.4ML IJ SOSY
40.0000 mg | PREFILLED_SYRINGE | INTRAMUSCULAR | Status: DC
  Administered 2023-05-30 – 2023-05-31 (×2): 40 mg via SUBCUTANEOUS
  Filled 2023-05-30 (×2): qty 0.4

## 2023-05-30 MED ORDER — SORBITOL 70 % SOLN: 30.0000 mL | Freq: Every day | Status: DC | PRN

## 2023-05-30 MED ORDER — LOSARTAN POTASSIUM 50 MG PO TABS
50.0000 mg | ORAL_TABLET | Freq: Every day | ORAL | Status: DC
  Administered 2023-05-30 – 2023-05-31 (×2): 50 mg via ORAL
  Filled 2023-05-30 (×2): qty 1

## 2023-05-30 MED ORDER — SPIRONOLACTONE 25 MG PO TABS
25.0000 mg | ORAL_TABLET | Freq: Every day | ORAL | Status: DC
  Administered 2023-05-30 – 2023-05-31 (×2): 25 mg via ORAL
  Filled 2023-05-30 (×2): qty 1

## 2023-05-30 MED ORDER — ONDANSETRON HCL 4 MG/2ML IJ SOLN: 4.0000 mg | Freq: Four times a day (QID) | INTRAMUSCULAR | Status: DC | PRN

## 2023-05-30 MED ORDER — MUSCLE RUB 10-15 % EX CREA
1.0000 | TOPICAL_CREAM | CUTANEOUS | Status: DC | PRN
  Administered 2023-05-30: 1 via TOPICAL
  Filled 2023-05-30: qty 85

## 2023-05-30 MED ORDER — INSULIN ASPART 100 UNIT/ML IJ SOLN: 0.0000 [IU] | Freq: Three times a day (TID) | INTRAMUSCULAR | Status: DC

## 2023-05-30 MED ORDER — MELATONIN 3 MG PO TABS: 3.0000 mg | ORAL_TABLET | Freq: Every evening | ORAL | Status: DC | PRN

## 2023-05-30 MED ORDER — HYDRALAZINE HCL 20 MG/ML IJ SOLN: 5.0000 mg | Freq: Four times a day (QID) | INTRAMUSCULAR | Status: DC | PRN

## 2023-05-30 MED ORDER — ONDANSETRON HCL 4 MG PO TABS: 4.0000 mg | ORAL_TABLET | Freq: Four times a day (QID) | ORAL | Status: DC | PRN

## 2023-05-30 NOTE — H&P (Addendum)
History and Physical    Patient: Troy Singh. ZOX:[id number] DOB: March 15, 1971 DOA: 05/29/2023 DOS: the patient was seen and examined on 05/30/2023 PCP: Rema Fendt, NP  Patient coming from: Home  Chief Complaint:  Chief Complaint  Patient presents with   Shortness of Breath   HPI: Troy Singh. is a 52 y.o. 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  The patient has been off all of his medication for about a month and a half due to finances.  He was on his way to his second job tonight when he became acutely short of breath.  He started to feel very panicky.  His he was actually driving down [address] and his breathing got so bad he turned off and came straight to the emergency department.  The patient was unable to park he just drove up to the door and asked for help and was brought in. In the emergency department the patient was in extremis.  His O2 sats were in the 70s.  His blood pressure was 173/114.  He was treated with nitro and IV Lasix.  He initially required 100% nonrebreather.  Eventually he was able to be weaned down to nasal cannula.  The hospitalist were asked to admit the patient for further care. A review of the patient's chart shows that his last doctor visit was with [facility] 10 months ago.  He was taking his medication at that point and doing well.  His last echocardiogram was December 2023 which revealed an EF of 35% with moderate LVH and grade 3 diastolic dysfunction.  He did have a cardiac catheterization in December 2023 as well.  That revealed very mild nonobstructive coronary artery disease.   Review of Systems: As mentioned in the history of present illness. All other systems reviewed and are negative. Past Medical History:  Diagnosis Date   Diabetes mellitus without complication  (HCC)    Gout    Hypertension    Past Surgical History:  Procedure Laterality Date   PENECTOMY     RIGHT/LEFT HEART CATH AND CORONARY ANGIOGRAPHY N/A 06/21/2022   Procedure: RIGHT/LEFT HEART CATH AND CORONARY ANGIOGRAPHY;  Surgeon: Kathleene Hazel, MD;  Location: [facility] INVASIVE CV LAB;  Service: Cardiovascular;  Laterality: N/A;   Social History:  reports that he quit smoking about 1 years ago. His smoking use included cigarettes. He has been exposed to tobacco smoke. He has never used smokeless tobacco. He reports that he does not drink alcohol and does not use drugs.  No Known Allergies  Family History  Problem Relation Age of Onset   Hypertrophic cardiomyopathy Mother    Diabetes Other    Hypertension Other     Prior to Admission medications   Medication Sig Start Date End Date Taking? Authorizing Provider  acetaminophen (TYLENOL) 500 MG tablet Take 1,000 mg by mouth every 6 (six) hours as needed for mild pain.    [provider]  allopurinol (ZYLOPRIM) 100 MG tablet Take 1 tablet (100 mg total) by mouth daily. 06/29/22 12/26/22  Almon Hercules, MD  aspirin EC 81 MG tablet Take 1 tablet (81 mg total) by mouth daily. Swallow whole. 06/26/22   Almon Hercules, MD  atorvastatin (LIPITOR) 40 MG tablet Take 1 tablet (40 mg total) by mouth daily. 07/16/22   Andrey Farmer, PA-C  carvedilol (COREG) 6.25  MG tablet Take 1 tablet (6.25 mg total) by mouth 2 (two) times daily with a meal. 07/30/22   Sabharwal, Aditya, DO  colchicine 0.6 MG tablet Take 1 tablet (0.6 mg total) by mouth every other day. 07/18/22 09/16/22  Almon Hercules, MD  furosemide (LASIX) 40 MG tablet Take 1 tablet (40 mg total) by mouth daily. 07/16/22   Andrey Farmer, PA-C  hydrALAZINE (APRESOLINE) 50 MG tablet Take 1 tablet (50 mg total) by mouth every 8 (eight) hours. 07/16/22   Andrey Farmer, PA-C  isosorbide mononitrate (IMDUR) 30 MG 24 hr tablet Take 1 tablet (30 mg total) by mouth daily. 07/16/22    Andrey Farmer, PA-C  metFORMIN (GLUCOPHAGE-XR) 500 MG 24 hr tablet Take 1 tablet (500 mg total) by mouth daily with breakfast. 06/28/22 09/26/22  Rema Fendt, NP  sacubitril-valsartan (ENTRESTO) 24-26 MG Take 1 tablet by mouth 2 (two) times daily. 07/30/22   Sabharwal, Aditya, DO  spironolactone (ALDACTONE) 25 MG tablet Take 1 tablet (25 mg total) by mouth daily. 07/22/22 10/20/22  Andrey Farmer, PA-C    Physical Exam: Vitals:   05/29/23 2330 05/29/23 2345 05/30/23 0000 05/30/23 0015  BP: (!) 166/96 135/81 137/80 (!) 148/83  Pulse: 68 70 65 66  Resp: (!) 28 (!) 22 20 19   Temp:      TempSrc:      SpO2: 100% 100% 100% 100%  Weight:      Height:       Physical Exam:  General: No acute distress, well developed, well nourished HEENT: Normocephalic, atraumatic, PERRL Cardiovascular: Normal rhythm. Bradycardia. Distal pulses intact. Pulmonary: Normal pulmonary effort, fine crackles in the left base Gastrointestinal: Nondistended abdomen, soft, non-tender, hypoactive bowel sounds Musculoskeletal:No lower ext edema Skin: Skin is warm and dry. Neuro: No focal deficits noted, AAOx3. PSYCH: Attentive and cooperative  Data Reviewed:  Results for orders placed or performed during the hospital encounter of 05/29/23 (from the past 24 hour(s))  CBC with Differential     Status: Abnormal   Collection Time: 05/29/23 11:23 PM  Result Value Ref Range   WBC 6.2 4.0 - 10.5 K/uL   RBC 4.16 (L) 4.22 - 5.81 MIL/uL   Hemoglobin 10.9 (L) 13.0 - 17.0 g/dL   HCT 04.5 (L) 40.9 - 81.1 %   MCV 85.3 80.0 - 100.0 fL   MCH 26.2 26.0 - 34.0 pg   MCHC 30.7 30.0 - 36.0 g/dL   RDW 91.4 (H) 78.2 - 95.6 %   Platelets 230 150 - 400 K/uL   nRBC 0.0 0.0 - 0.2 %   Neutrophils Relative % 60 %   Neutro Abs 3.8 1.7 - 7.7 K/uL   Lymphocytes Relative 24 %   Lymphs Abs 1.5 0.7 - 4.0 K/uL   Monocytes Relative 6 %   Monocytes Absolute 0.4 0.1 - 1.0 K/uL   Eosinophils Relative 9 %   Eosinophils Absolute  0.6 (H) 0.0 - 0.5 K/uL   Basophils Relative 1 %   Basophils Absolute 0.0 0.0 - 0.1 K/uL   Immature Granulocytes 0 %   Abs Immature Granulocytes 0.01 0.00 - 0.07 K/uL  Comprehensive metabolic panel     Status: Abnormal   Collection Time: 05/29/23 11:23 PM  Result Value Ref Range   Sodium 137 135 - 145 mmol/L   Potassium 3.7 3.5 - 5.1 mmol/L   Chloride 106 98 - 111 mmol/L   CO2 20 (L) 22 - 32 mmol/L   Glucose, Bld 174 (H) 70 -  99 mg/dL   BUN 30 (H) 6 - 20 mg/dL   Creatinine, Ser 4.69 (H) 0.61 - 1.24 mg/dL   Calcium 9.0 8.9 - 62.9 mg/dL   Total Protein 7.4 6.5 - 8.1 g/dL   Albumin 3.6 3.5 - 5.0 g/dL   AST 26 15 - 41 U/L   ALT 20 0 - 44 U/L   Alkaline Phosphatase 85 38 - 126 U/L   Total Bilirubin 0.9 <1.2 mg/dL   GFR, Estimated 45 (L) >60 mL/min   Anion gap 11 5 - 15  Troponin I (High Sensitivity)     Status: Abnormal   Collection Time: 05/29/23 11:23 PM  Result Value Ref Range   Troponin I (High Sensitivity) 25 (H) <18 ng/L  Brain natriuretic peptide     Status: Abnormal   Collection Time: 05/29/23 11:23 PM  Result Value Ref Range   B Natriuretic Peptide 1,762.8 (H) 0.0 - 100.0 pg/mL  I-stat chem 8, ED (not at [facility], [facility] or [facility])     Status: Abnormal   Collection Time: 05/29/23 11:47 PM  Result Value Ref Range   Sodium 141 135 - 145 mmol/L   Potassium 3.8 3.5 - 5.1 mmol/L   Chloride 107 98 - 111 mmol/L   BUN 28 (H) 6 - 20 mg/dL   Creatinine, Ser 5.28 (H) 0.61 - 1.24 mg/dL   Glucose, Bld 413 (H) 70 - 99 mg/dL   Calcium, Ion 2.44 0.10 - 1.40 mmol/L   TCO2 24 22 - 32 mmol/L   Hemoglobin 11.6 (L) 13.0 - 17.0 g/dL   HCT 27.2 (L) 53.6 - 64.4 %     Assessment and Plan: Flash Pulmonary edema/ Hypertensive emergency - Looks like the patient was previously on Coreg 3.125 mg twice daily, but this will not be started because his heart rate is in the 50s.  He was also on hydralazine and Imdur and spironolactone.  He was started on Entresto but as the patient is having financial  difficulties think I will hold off on starting Entresto right now and perhaps just put him on losartan. - Echo - Cycle troponins - Will try to keep sbp>140 so as not to drop it too much.   2.  The patient had a problem the emergency department where his heart rate bradied down to the 30s.  It came back up spontaneously but the patient was quite symptomatic.  Will monitor in the ICU. His nitroglycerin ointment was rubbed off and he felt a little better.  3. T2DM -restart Jardiance and Metformin when the patient is able.  4. AKI in patient w CKD - Monitor   Advance Care Planning:   Code Status: Full Code the patient will be full code by default.  CODE STATUS was not discussed.  Consults: none  Family Communication: none  Severity of Illness: The appropriate patient status for this patient is INPATIENT. Inpatient status is judged to be reasonable and necessary in order to provide the required intensity of service to ensure the patient's safety. The patient's presenting symptoms, physical exam findings, and initial radiographic and laboratory data in the context of their chronic comorbidities is felt to place them at high risk for further clinical deterioration. Furthermore, it is not anticipated that the patient will be medically stable for discharge from the hospital within 2 midnights of admission.   * I certify that at the point of admission it is my clinical judgment that the patient will require inpatient hospital care spanning beyond 2 midnights from  the point of admission due to high intensity of service, high risk for further deterioration and high frequency of surveillance required.*  Author: Buena Irish, MD 05/30/2023 1:32 AM  For on call review [email or web address].

## 2023-05-30 NOTE — Plan of Care (Signed)

## 2023-05-30 NOTE — Care Management (Signed)
[facility] Medication Assistance Card *Pharmacies please call [phone] for claim processing assistance Name: Troy Singh.                                                                                                                                                                                                                               Relationship Code:  1 ID (MRN): [id number]                                                                                                                                                                                                                          Person Code:  [id number] Bin: [id number] RX Group: [id number] Discharge Date: 05/31/2023                                 RX PCN:  [id number] Expiration Date:06/10/2023                                           (must be filled within 7 days of discharge)     You have been approved to have the prescriptions written by your discharging physician filled through our [facility] (Medication Assistance Through [facility]) program. This program allows for a one-time (no refills) 34-day supply of  selected medications for a low copay amount.  The copay is $3.00 per prescription. For instance, if you have one prescription, you will pay $3.00; for two prescriptions, you pay $6.00; for three prescriptions, you pay $9.00; and so on.  Only certain pharmacies are participating in this program with [facility]. You will need to select one of the pharmacies from the attached list and take your prescriptions, this letter, and your photo ID to one of the [facility] pharmacies.   We are excited that you are able to use the [facility] program to get your medications. These prescriptions must be filled within 7 days of hospital discharge or they will no longer be valid for the [facility] program. Should you have any problems with your prescriptions please contact your case management team member at [phone] for [place]  [facility].  Thank you,   [facility]

## 2023-05-30 NOTE — Discharge Instructions (Signed)
Health Insurance Resource: Affordable Care Act (https://www.[email or web address].is) Enroll by June 29, 2023 for 2025 coverage

## 2023-05-30 NOTE — ED Notes (Signed)
..ED TO INPATIENT HANDOFF REPORT  ED Nurse Name and Phone #: Buckner Malta [phone]  S Name/Age/Gender Troy Singh. 52 y.o. male Room/Bed: WA22/WA22  Code Status   Code Status: Prior  Home/SNF/Other Home Patient oriented to: self, place, time, and situation Is this baseline? Yes   Triage Complete: Triage complete  Chief Complaint Flash pulmonary edema [facility]) [J81.0]  Triage Note Patient complaining of sudden onset of shortness of breath tonight. Denies chest pain. States he has a hx of heart failure but has been out of his lasix for a month or two. Room air saturation 70% in triage. Spo2 75% on 6L. NRB placed and spo2 improved to 94%. Patient transferred into room.    Allergies No Known Allergies  Level of Care/Admitting Diagnosis ED Disposition     ED Disposition  Admit   Condition  --   Comment  Hospital Area: [facility] [[id number]]  Level of Care: ICU [6]  May admit patient to [facility] or [facility] if equivalent level of care is available:: Yes  Covid Evaluation: Asymptomatic - no recent exposure (last 10 days) testing not required  Diagnosis: Flash pulmonary edema [facility]) [[id number]]  Admitting Physician: Buena Irish [[id number]]  Attending Physician: Buena Irish [phone]  Certification:: I certify this patient will need inpatient services for at least 2 midnights  Expected Medical Readiness: 06/02/2023          B Medical/Surgery History Past Medical History:  Diagnosis Date   Diabetes mellitus without complication (HCC)    Gout    Hypertension    Past Surgical History:  Procedure Laterality Date   PENECTOMY     RIGHT/LEFT HEART CATH AND CORONARY ANGIOGRAPHY N/A 06/21/2022   Procedure: RIGHT/LEFT HEART CATH AND CORONARY ANGIOGRAPHY;  Surgeon: Kathleene Hazel, MD;  Location: [facility] INVASIVE CV LAB;  Service: Cardiovascular;  Laterality: N/A;     A IV Location/Drains/Wounds Patient Lines/Drains/Airways Status      Active Line/Drains/Airways     Name Placement date Placement time Site Days   Peripheral IV 05/29/23 20 G 1" Anterior;Left Forearm 05/29/23  2305  Forearm  1            Intake/Output Last 24 hours No intake or output data in the 24 hours ending 05/30/23 0050  Labs/Imaging Results for orders placed or performed during the hospital encounter of 05/29/23 (from the past 48 hour(s))  CBC with Differential     Status: Abnormal   Collection Time: 05/29/23 11:23 PM  Result Value Ref Range   WBC 6.2 4.0 - 10.5 K/uL   RBC 4.16 (L) 4.22 - 5.81 MIL/uL   Hemoglobin 10.9 (L) 13.0 - 17.0 g/dL   HCT 62.9 (L) 52.8 - 41.3 %   MCV 85.3 80.0 - 100.0 fL   MCH 26.2 26.0 - 34.0 pg   MCHC 30.7 30.0 - 36.0 g/dL   RDW 24.4 (H) 01.0 - 27.2 %   Platelets 230 150 - 400 K/uL   nRBC 0.0 0.0 - 0.2 %   Neutrophils Relative % 60 %   Neutro Abs 3.8 1.7 - 7.7 K/uL   Lymphocytes Relative 24 %   Lymphs Abs 1.5 0.7 - 4.0 K/uL   Monocytes Relative 6 %   Monocytes Absolute 0.4 0.1 - 1.0 K/uL   Eosinophils Relative 9 %   Eosinophils Absolute 0.6 (H) 0.0 - 0.5 K/uL   Basophils Relative 1 %   Basophils Absolute 0.0 0.0 - 0.1 K/uL   Immature Granulocytes 0 %  Abs Immature Granulocytes 0.01 0.00 - 0.07 K/uL    Comment: Performed at [facility], [address] [place], [place] [address]  Comprehensive metabolic panel     Status: Abnormal   Collection Time: 05/29/23 11:23 PM  Result Value Ref Range   Sodium 137 135 - 145 mmol/L   Potassium 3.7 3.5 - 5.1 mmol/L   Chloride 106 98 - 111 mmol/L   CO2 20 (L) 22 - 32 mmol/L   Glucose, Bld 174 (H) 70 - 99 mg/dL    Comment: Glucose reference range applies only to samples taken after fasting for at least 8 hours.   BUN 30 (H) 6 - 20 mg/dL   Creatinine, Ser 6.96 (H) 0.61 - 1.24 mg/dL   Calcium 9.0 8.9 - 29.5 mg/dL   Total Protein 7.4 6.5 - 8.1 g/dL   Albumin 3.6 3.5 - 5.0 g/dL   AST 26 15 - 41 U/L   ALT 20 0 - 44 U/L   Alkaline Phosphatase 85  38 - 126 U/L   Total Bilirubin 0.9 <1.2 mg/dL   GFR, Estimated 45 (L) >60 mL/min    Comment: (NOTE) Calculated using the CKD-EPI Creatinine Equation (2021)    Anion gap 11 5 - 15    Comment: Performed at [facility], [address] [place], [place] [address]  Troponin I (High Sensitivity)     Status: Abnormal   Collection Time: 05/29/23 11:23 PM  Result Value Ref Range   Troponin I (High Sensitivity) 25 (H) <18 ng/L    Comment: (NOTE) Elevated high sensitivity troponin I (hsTnI) values and significant  changes across serial measurements may suggest ACS but many other  chronic and acute conditions are known to elevate hsTnI results.  Refer to the "Links" section for chest pain algorithms and additional  guidance. Performed at [facility], [address] [place], [place] [address]   Brain natriuretic peptide     Status: Abnormal   Collection Time: 05/29/23 11:23 PM  Result Value Ref Range   B Natriuretic Peptide 1,762.8 (H) 0.0 - 100.0 pg/mL    Comment: Performed at [facility], [address] [place], [place] [address]  I-stat chem 8, ED (not at [facility], [facility] or [facility])     Status: Abnormal   Collection Time: 05/29/23 11:47 PM  Result Value Ref Range   Sodium 141 135 - 145 mmol/L   Potassium 3.8 3.5 - 5.1 mmol/L   Chloride 107 98 - 111 mmol/L   BUN 28 (H) 6 - 20 mg/dL   Creatinine, Ser 3.66 (H) 0.61 - 1.24 mg/dL   Glucose, Bld 440 (H) 70 - 99 mg/dL    Comment: Glucose reference range applies only to samples taken after fasting for at least 8 hours.   Calcium, Ion 1.19 1.15 - 1.40 mmol/L   TCO2 24 22 - 32 mmol/L   Hemoglobin 11.6 (L) 13.0 - 17.0 g/dL   HCT 34.7 (L) 42.5 - 95.6 %   DG Chest Port 1 View  Result Date: 05/30/2023 CLINICAL DATA:  Shortness of breath. EXAM: PORTABLE CHEST 1 VIEW COMPARISON:  June 19, 2022 FINDINGS: The cardiac silhouette is mildly enlarged and unchanged in size. There is mild prominence of  the central pulmonary vasculature. Mild to moderate severity bilateral, predominately perihilar airspace disease is noted. No pleural effusion or pneumothorax is identified. The visualized skeletal structures are unremarkable. IMPRESSION: Mild to moderate severity bilateral, predominately perihilar airspace disease which may represent  pulmonary edema versus multifocal pneumonia. Electronically Signed   By: Aram Candela M.D.   On: 05/30/2023 00:34    Pending Labs Unresulted Labs (From admission, onward)    None       Vitals/Pain Today's Vitals   05/29/23 2330 05/29/23 2345 05/30/23 0000 05/30/23 0015  BP: (!) 166/96 135/81 137/80 (!) 148/83  Pulse: 68 70 65 66  Resp: (!) 28 (!) 22 20 19   Temp:      TempSrc:      SpO2: 100% 100% 100% 100%  Weight:      Height:      PainSc:        Isolation Precautions No active isolations  Medications Medications  nitroGLYCERIN (NITROGLYN) 2 % ointment 1 inch (1 inch Topical Given 05/29/23 2333)  hydrALAZINE (APRESOLINE) injection 10 mg (10 mg Intravenous Given 05/29/23 2333)  furosemide (LASIX) injection 40 mg (40 mg Intravenous Given 05/29/23 2333)    Mobility walks     Focused Assessments Pulmonary Assessment Handoff:  Lung sounds:   O2 Device: NRB O2 Flow Rate (L/min): 15 L/min    R Recommendations: See Admitting Provider Note  Report given to:   Additional Notes:

## 2023-05-30 NOTE — TOC Initial Note (Addendum)
Transition of Care (TOC) - Initial/Assessment Note    Patient Details  Name: Troy Singh. MRN: [id number] Date of Birth: 04-14-1971  Transition of Care [facility]) CM/SW Contact:    Lavenia Atlas, RN Phone Number: 05/30/2023, 6:04 PM  Clinical Narrative:   Per chart review patient currently in [facility] ICU due to flash pulmonary edema. Received TOC consult for HF/COPD, medication assistance and PCP needs. Per chart review patient had EF of 35% on ECHO in Jan 2024, will notify HF Navigation team. This RNCM spoke with patient at bedside who reports he was on his way to work became short of breath and drove to hospital. 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 This RNCM suggested contacting [employer] marketplace to obtain insurance. Patient reports he was [facility] in the past. Patient is eligible for [facility] program and can afford $3 copay per medication. This RNCM will attach [facility] PCP information to AVS as patient will call to make his appointment post discharge.      Transportation at discharge:  self/car in [place] parking lot              TOC will continue to follow for discharge needs.  Expected Discharge Plan: Home/Self Care Barriers to Discharge: Continued Medical Work up   Patient Goals and CMS Choice Patient states their goals for this hospitalization and ongoing recovery are:: to feel better and started taking medication consistantly CMS Medicare.gov Compare Post Acute Care list provided to:: Other (Comment Required) (N/A) Choice offered to / list presented to : NA [place] ownership interest in [facility].provided to::  (N/A)    Expected Discharge Plan and Services In-house Referral: PCP / [facility] Connect Discharge Planning Services: CM Consult, MATCH Program, Medication Assistance Post Acute Care Choice: NA Living arrangements for the past 2 months: Apartment                 DME Arranged: N/A DME Agency: NA        HH Arranged: NA HH Agency: NA        Prior Living Arrangements/Services Living arrangements for the past 2 months: Apartment Lives with:: Self, Relatives Patient language and need for interpreter reviewed:: Yes Do you feel safe going back to the place where you live?: Yes      Need for Family Participation in Patient Care: No (Comment) Care giver support system in place?: Yes (comment) Current home services: Other (comment) (None) Criminal Activity/Legal Involvement Pertinent to Current Situation/Hospitalization: No - Comment as needed  Activities of Daily Living   ADL Screening (condition at time of admission) Independently performs ADLs?: Yes (appropriate for developmental age) Is the patient deaf or have difficulty hearing?: No Does the patient have difficulty seeing, even when wearing glasses/contacts?: No Does the patient have difficulty concentrating, remembering, or making decisions?: No  Permission Sought/Granted Permission sought to share information with : Case Manager Permission granted to share information with : Yes, Verbal Permission Granted  Share Information with NAME: Case manager           Emotional Assessment Appearance:: Appears stated age Attitude/Demeanor/Rapport: Engaged, Gracious Affect (typically observed): Accepting, Pleasant Orientation: : Oriented to Self, Oriented to Place, Oriented to  Time, Oriented to Situation Alcohol / Substance Use: Not Applicable Psych Involvement: No (comment)  Admission diagnosis:  Flash pulmonary edema (HCC) [J81.0] Hypertensive emergency [I16.1] Patient Active Problem List   Diagnosis Date Noted   Flash pulmonary edema (  HCC) 05/30/2023   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   NSVT (nonsustained ventricular tachycardia) (HCC) 06/23/2022   Fever 06/23/2022   Acute combined systolic and diastolic heart failure (HCC) 06/22/2022   Coronary artery disease due to lipid rich plaque 06/22/2022   Pure  hypercholesterolemia 06/22/2022   AKI (acute kidney injury) (HCC) 06/22/2022   CAP (community acquired pneumonia) 06/22/2022   At risk for sleep apnea 06/22/2022   Gout flare 06/22/2022   Morbid obesity (HCC) 06/22/2022   DCM (dilated cardiomyopathy) (HCC) 06/21/2022   Hypertensive emergency 06/20/2022   Elevated troponin 06/20/2022   DM type 2, goal HbA1c < 7% (HCC) 06/20/2022   Acute congestive heart failure (HCC) 06/20/2022   PCP:  Rema Fendt, NP Pharmacy:   [facility] [address] [place] [facility] [address] [place] [address] Phone: [phone] Fax: [phone]  [facility] [address] [place] [address] Phone: [phone] Fax: [phone]  [facility] [address] [place] [address] Phone: [phone] Fax: [phone]     Social Determinants of Health (SDOH) Social History: SDOH Screenings   Food Insecurity: No Food Insecurity (05/30/2023)  Housing: Low Risk  (05/30/2023)  Transportation Needs: No Transportation Needs (05/30/2023)  Utilities: Not At Risk (05/30/2023)  Depression (PHQ2-9): Low Risk  (06/28/2022)  Financial Resource Strain: Medium Risk (07/16/2022)  Tobacco Use: Medium Risk (05/30/2023)   SDOH Interventions:     Readmission Risk Interventions    05/30/2023    5:54 PM  Readmission Risk Prevention Plan  Post Dischage Appt Not Complete  Appt Comments Patient will follow up, unable to schedule over the weekend.  Medication Screening Complete  Transportation Screening Complete

## 2023-05-30 NOTE — Progress Notes (Signed)
  Progress Note   Patient: Troy Singh. 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  According to patient he has no insurance and have not been able to get his medications as needed he plans to get insurance from his work.  Currently doing better oxygen being tapered off.  For detailed assessment and plan please refer to H&P by hospitalist this morning.  Physical Exam: Vitals:   05/30/23 0600 05/30/23 0800 05/30/23 0805 05/30/23 0900  BP: (!) 146/65 (!) 171/87  (!) 140/74  Pulse: (!) 56 67 (!) 59 64  Resp: 14 (!) 26 17 (!) 25  Temp:  97.8 F (36.6 C)    TempSrc:  Axillary    SpO2: 97% 100% 100% 96%  Weight:      Height:          Author: Loyce Dys, MD 05/30/2023 11:00 AM  For on call review [email or web address].

## 2023-05-30 NOTE — Progress Notes (Signed)
eLink Physician-Brief Progress Note Patient Name: Troy Singh. DOB: 1971-02-19 MRN: [id number]   Date of Service  05/30/2023  HPI/Events of Note  Patient with cardiomyopathy with systolic dysfunction (EF 35 %), poorly compliant with medication regimen due to economic constraints, who was admitted with uncontrolled hypertension and acute pulmonary edema. He is on the Hospitalist Service.  eICU Interventions  New Patient Evaluation.        Troy Singh U [name] 05/30/2023, 2:15 AM

## 2023-05-31 LAB — BASIC METABOLIC PANEL
Anion gap: 11 (ref 5–15)
BUN: 32 mg/dL — ABNORMAL HIGH (ref 6–20)
CO2: 23 mmol/L (ref 22–32)
Calcium: 9.6 mg/dL (ref 8.9–10.3)
Chloride: 103 mmol/L (ref 98–111)
Creatinine, Ser: 1.6 mg/dL — ABNORMAL HIGH (ref 0.61–1.24)
GFR, Estimated: 51 mL/min — ABNORMAL LOW (ref >60)
Glucose, Bld: 88 mg/dL (ref 70–99)
Potassium: 3.9 mmol/L (ref 3.5–5.1)
Sodium: 137 mmol/L (ref 135–145)

## 2023-05-31 LAB — GLUCOSE, CAPILLARY
Glucose-Capillary: 95 mg/dL (ref 70–99)
Glucose-Capillary: 125 mg/dL — ABNORMAL HIGH (ref 70–99)

## 2023-05-31 LAB — CBC
HCT: 36.8 % — ABNORMAL LOW (ref 39.0–52.0)
Hemoglobin: 11.2 g/dL — ABNORMAL LOW (ref 13.0–17.0)
MCH: 25.7 pg — ABNORMAL LOW (ref 26.0–34.0)
MCHC: 30.4 g/dL (ref 30.0–36.0)
MCV: 84.6 fL (ref 80.0–100.0)
Platelets: 253 10*3/uL (ref 150–400)
RBC: 4.35 MIL/uL (ref 4.22–5.81)
RDW: 17.2 % — ABNORMAL HIGH (ref 11.5–15.5)
WBC: 5.9 10*3/uL (ref 4.0–10.5)
nRBC: 0 % (ref 0.0–0.2)

## 2023-05-31 MED ORDER — LOSARTAN POTASSIUM 50 MG PO TABS: 50.0000 mg | ORAL_TABLET | Freq: Every day | ORAL | 3 refills | Status: DC

## 2023-05-31 NOTE — Discharge Summary (Signed)
Physician Discharge Summary   Patient: Troy Singh. MRN: [id number] DOB: 05/05/1971  Admit date:     05/29/2023  Discharge date: 05/31/23  Discharge Physician: Loyce Dys   PCP: Rema Fendt, NP   Recommendations at discharge:  Follow-up with cardiology  Discharge Diagnoses: Flash Pulmonary edema/ Hypertensive emergency Acute on chronic systolic congestive heart failure T2DM   AKI in patient w CKD 3A    Hospital Course: Troy Iha. is a 52 y.o. 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  The patient has been off all of his medication for about a month and a half due to finances.  He was on his way to his second job tonight when he became acutely short of breath.  He started to feel very panicky.  His he was actually driving down [address] and his breathing got so bad he turned off and came straight to the emergency department.  The patient was unable to park he just drove up to the door and asked for help and was brought in. In the emergency department the patient  O2 sats were in the 70s.  His blood pressure was 173/114.  He was treated with nitro and IV Lasix.  He initially required 100% nonrebreather.  Eventually he was able to be weaned down to nasal cannula.  The hospitalist were asked to admit the patient for further care. A review of the patient's chart shows that his last doctor visit was with [facility] 10 months ago.  He was taking his medication at that point and doing well.  He did have a cardiac catheterization in December 2023 as well.  That revealed very mild nonobstructive coronary artery disease.  Patient was subsequently admitted with pulmonary edema from acute on chronic systolic heart failure underwent IV diuresis he improved  remarkably was taken off oxygen and was doing very well.  I explained to him discussing his condition with him and the need to be compliant with his medications.  He agreed that because of his financial situation and now being planned to get insurance from his current job he will rather use losartan in place of Entresto until his insurance kicks in, this way he will be able to buy his medications until his insurance is ready.  I have advised him concerning low-salt low-cholesterol diet as well as fluid restriction and the need to follow-up with his cardiologist.  Patient has agreed and understands to do all of these.  He has agreed that once he gets his insurance other medications including Jardiance will be added on for optimal CHF management.      Procedures performed: None Disposition: Home Diet recommendation:  Cardiac diet DISCHARGE MEDICATION: Allergies as of 05/31/2023   No Known Allergies      Medication List     STOP taking these medications    colchicine 0.6 MG tablet   Entresto 24-26 MG Generic drug: sacubitril-valsartan       TAKE these medications    acetaminophen 500 MG tablet Commonly known as: TYLENOL Take 1,000 mg by mouth every 6 (six) hours as needed for mild pain.   allopurinol 100 MG tablet Commonly known as: Zyloprim Take 1 tablet (100 mg total) by mouth daily.   aspirin EC 81 MG tablet Take 1 tablet (81 mg  total) by mouth daily. Swallow whole.   atorvastatin 40 MG tablet Commonly known as: LIPITOR Take 1 tablet (40 mg total) by mouth daily.   carvedilol 6.25 MG tablet Commonly known as: COREG Take 1 tablet (6.25 mg total) by mouth 2 (two) times daily with a meal.   furosemide 40 MG tablet Commonly known as: Lasix Take 1 tablet (40 mg total) by mouth daily.   hydrALAZINE 50 MG tablet Commonly known as: APRESOLINE Take 1 tablet (50 mg total) by mouth every 8 (eight) hours.   isosorbide mononitrate 30 MG 24 hr tablet Commonly known as:  IMDUR Take 1 tablet (30 mg total) by mouth daily.   losartan 50 MG tablet Commonly known as: COZAAR Take 1 tablet (50 mg total) by mouth daily. Start taking on: June 01, 2023   metFORMIN 500 MG 24 hr tablet Commonly known as: GLUCOPHAGE-XR Take 1 tablet (500 mg total) by mouth daily with breakfast.   spironolactone 25 MG tablet Commonly known as: ALDACTONE Take 1 tablet (25 mg total) by mouth daily.        Follow-up Information     [facility]. Schedule an appointment as soon as possible for a visit in 1 day(s).   Specialty: Internal Medicine Why: Call to make an appointment as soon as possible after discharge. This is to establish primary care provider. Contact information: [address] [place] [address] [phone]        [facility]. Schedule an appointment as soon as possible for a visit.   Why: Call to make an appointment as soon as possible after discharge. This is to establish primary care provider. Contact information: [address] [place] [address] [phone]        Yvonne Kendall, MD. Schedule an appointment as soon as possible for a visit.   Specialty: Cardiology Contact information: [address] [place] [address] [phone]                Discharge Exam: Ceasar Mons Weights   05/29/23 2304 05/30/23 0130  Weight: 72.6 kg 87.6 kg   General: No acute distress, well developed, well nourished HEENT: Normocephalic, atraumatic, PERRL Cardiovascular: Normal rhythm. Bradycardia. Distal pulses intact. Pulmonary: Normal pulmonary effort, fine crackles in the left base Gastrointestinal: Nondistended abdomen, soft, non-tender, hypoactive bowel sounds Musculoskeletal:No lower ext edema Skin: Skin is warm and dry. Neuro: No focal deficits noted, AAOx3. PSYCH: Attentive and cooperative  Condition at discharge: good  The  results of significant diagnostics from this hospitalization (including imaging, microbiology, ancillary and laboratory) are listed below for reference.   Imaging Studies: ECHOCARDIOGRAM COMPLETE  Result Date: 05/30/2023    ECHOCARDIOGRAM REPORT   Patient Name:   Troy Singh. Date of Exam: 05/30/2023 Medical Rec #:  [id number]         Height:       63.0 in Accession #:    [id number]        Weight:       193.1 lb Date of Birth:  04-08-1971         BSA:          1.905 m Patient Age:    52 years          BP:           146/65 mmHg Patient Gender: M  HR:           64 bpm. Exam Location:  Inpatient Procedure: 2D Echo, Color Doppler, Cardiac Doppler and Strain Analysis Indications:    CHF  History:        Patient has prior history of Echocardiogram examinations, most                 recent 06/20/2022. Amyloidosis, CHF and Cardiomyopathy, CAD,                 Flash Pulmonary Edema; Risk Factors:Diabetes.  Sonographer:    Milbert Coulter Referring Phys: [id number] CLAUDIA CLAIBORNE  Sonographer Comments: Global longitudinal strain was attempted. IMPRESSIONS  1. Left ventricular ejection fraction, by estimation, is 25 to 30%. The left ventricle has severely decreased function. The left ventricle demonstrates global hypokinesis. The left ventricular internal cavity size was severely dilated. There is moderate  left ventricular hypertrophy of the basal-septal segment. Left ventricular diastolic parameters are consistent with Grade III diastolic dysfunction (restrictive). Elevated left ventricular end-diastolic pressure. The average left ventricular global longitudinal strain is -6.7 %. The global longitudinal strain is abnormal.  2. Right ventricular systolic function is normal. The right ventricular size is normal. Tricuspid regurgitation signal is inadequate for assessing PA pressure.  3. Left atrial size was severely dilated.  4. Right atrial size was mildly dilated.  5. The mitral valve is normal in  structure. Mild mitral valve regurgitation. No evidence of mitral stenosis.  6. The aortic valve is tricuspid. Aortic valve regurgitation is not visualized. Aortic valve sclerosis is present, with no evidence of aortic valve stenosis. Aortic valve area, by VTI measures 2.82 cm. Aortic valve mean gradient measures 4.0 mmHg. Aortic valve Vmax measures 1.41 m/s.  7. The inferior vena cava is normal in size with greater than 50% respiratory variability, suggesting right atrial pressure of 3 mmHg. FINDINGS  Left Ventricle: Left ventricular ejection fraction, by estimation, is 25 to 30%. The left ventricle has severely decreased function. The left ventricle demonstrates global hypokinesis. The average left ventricular global longitudinal strain is -6.7 %. The global longitudinal strain is abnormal. The left ventricular internal cavity size was severely dilated. There is moderate left ventricular hypertrophy of the basal-septal segment. Left ventricular diastolic parameters are consistent with Grade III diastolic dysfunction (restrictive). Elevated left ventricular end-diastolic pressure. Right Ventricle: The right ventricular size is normal. No increase in right ventricular wall thickness. Right ventricular systolic function is normal. Tricuspid regurgitation signal is inadequate for assessing PA pressure. Left Atrium: Left atrial size was severely dilated. Right Atrium: Right atrial size was mildly dilated. Pericardium: There is no evidence of pericardial effusion. Mitral Valve: The mitral valve is normal in structure. Mild mitral valve regurgitation. No evidence of mitral valve stenosis. Tricuspid Valve: The tricuspid valve is normal in structure. Tricuspid valve regurgitation is mild . No evidence of tricuspid stenosis. Aortic Valve: The aortic valve is tricuspid. Aortic valve regurgitation is not visualized. Aortic valve sclerosis is present, with no evidence of aortic valve stenosis. Aortic valve mean gradient  measures 4.0 mmHg. Aortic valve peak gradient measures 8.0  mmHg. Aortic valve area, by VTI measures 2.82 cm. Pulmonic Valve: The pulmonic valve was normal in structure. Pulmonic valve regurgitation is trivial. No evidence of pulmonic stenosis. Aorta: The aortic root is normal in size and structure. Venous: The inferior vena cava is normal in size with greater than 50% respiratory variability, suggesting right atrial pressure of 3 mmHg. IAS/Shunts: No atrial level shunt detected by color flow  Doppler.  LEFT VENTRICLE PLAX 2D LVIDd:         6.50 cm   Diastology LVIDs:         4.90 cm   LV e' medial:    6.41 cm/s LV PW:         1.10 cm   LV E/e' medial:  20.1 LV IVS:        1.50 cm   LV e' lateral:   5.43 cm/s LVOT diam:     2.30 cm   LV E/e' lateral: 23.8 LV SV:         85 LV SV Index:   45        2D Longitudinal Strain LVOT Area:     4.15 cm  2D Strain GLS (A2C):   -7.9 %                          2D Strain GLS (A3C):   -5.8 %                          2D Strain GLS (A4C):   -6.3 %                          2D Strain GLS Avg:     -6.7 % RIGHT VENTRICLE RV Basal diam:  4.00 cm RV Mid diam:    2.80 cm RV S prime:     14.00 cm/s TAPSE (M-mode): 2.4 cm LEFT ATRIUM             Index        RIGHT ATRIUM           Index LA diam:        4.50 cm 2.36 cm/m   RA Area:     20.70 cm LA Vol (A2C):   95.5 ml 50.13 ml/m  RA Volume:   63.50 ml  33.33 ml/m LA Vol (A4C):   68.9 ml 36.17 ml/m LA Biplane Vol: 87.7 ml 46.03 ml/m  AORTIC VALVE AV Area (Vmax):    3.09 cm AV Area (Vmean):   2.83 cm AV Area (VTI):     2.82 cm AV Vmax:           141.00 cm/s AV Vmean:          94.700 cm/s AV VTI:            0.302 m AV Peak Grad:      8.0 mmHg AV Mean Grad:      4.0 mmHg LVOT Vmax:         105.00 cm/s LVOT Vmean:        64.500 cm/s LVOT VTI:          0.205 m LVOT/AV VTI ratio: 0.68  AORTA Ao Root diam: 3.70 cm Ao Asc diam:  3.70 cm MITRAL VALVE MV Area (PHT): 4.41 cm     SHUNTS MV Decel Time: 172 msec     Systemic VTI:  0.20 m MV E  velocity: 129.00 cm/s  Systemic Diam: 2.30 cm MV A velocity: 35.00 cm/s MV E/A ratio:  3.69 Armanda Magic MD Electronically signed by Armanda Magic MD Signature Date/Time: 05/30/2023/3:35:35 PM    Final    DG Chest Port 1 View  Result Date: 05/30/2023 CLINICAL DATA:  Shortness of breath. EXAM: PORTABLE CHEST 1 VIEW COMPARISON:  June 19, 2022 FINDINGS: The cardiac silhouette is  mildly enlarged and unchanged in size. There is mild prominence of the central pulmonary vasculature. Mild to moderate severity bilateral, predominately perihilar airspace disease is noted. No pleural effusion or pneumothorax is identified. The visualized skeletal structures are unremarkable. IMPRESSION: Mild to moderate severity bilateral, predominately perihilar airspace disease which may represent pulmonary edema versus multifocal pneumonia. Electronically Signed   By: Aram Candela M.D.   On: 05/30/2023 00:34    Microbiology: Results for orders placed or performed during the hospital encounter of 05/29/23  MRSA Next Gen by PCR, Nasal     Status: None   Collection Time: 05/30/23  1:15 AM   Specimen: Nasal Mucosa; Nasal Swab  Result Value Ref Range Status   MRSA by PCR Next Gen NOT DETECTED NOT DETECTED Final    Comment: (NOTE) The GeneXpert MRSA Assay (FDA approved for NASAL specimens only), is one component of a comprehensive MRSA colonization surveillance program. It is not intended to diagnose MRSA infection nor to guide or monitor treatment for MRSA infections. Test performance is not FDA approved in patients less than 14 years old. Performed at [facility], [address] [place], [place] [address]     Labs: CBC: Recent Labs  Lab 05/29/23 2323 05/29/23 2347 05/30/23 0253 05/31/23 0227  WBC 6.2  --  7.7 5.9  NEUTROABS 3.8  --   --   --   HGB 10.9* 11.6* 10.2* 11.2*  HCT 35.5* 34.0* 33.6* 36.8*  MCV 85.3  --  83.6 84.6  PLT 230  --  238 253   Basic Metabolic Panel: Recent  Labs  Lab 05/29/23 2323 05/29/23 2347 05/30/23 0253 05/31/23 0227  NA 137 141 138 137  K 3.7 3.8 4.0 3.9  CL 106 107 106 103  CO2 20*  --  23 23  GLUCOSE 174* 138* 114* 88  BUN 30* 28* 31* 32*  CREATININE 1.77* 1.80* 1.59* 1.60*  CALCIUM 9.0  --  9.1 9.6  MG  --   --  1.9  --    Liver Function Tests: Recent Labs  Lab 05/29/23 2323  AST 26  ALT 20  ALKPHOS 85  BILITOT 0.9  PROT 7.4  ALBUMIN 3.6   CBG: Recent Labs  Lab 05/30/23 0754 05/30/23 1135 05/30/23 1602 05/30/23 2204 05/31/23 0746  GLUCAP 116* 113* 106* 94 95    Discharge time spent:  37 minutes.  Signed: Loyce Dys, MD [facility] 05/31/2023

## 2023-05-31 NOTE — Progress Notes (Signed)
This RN reviewed AVS paperwork with patient. Patient verbalized understanding of new medications, who to follow up with, and which medications to stop taking. Patient ambulated to Emergency Department exit accompanied by staff.

## 2023-05-31 NOTE — Plan of Care (Signed)
  Problem: Education: Goal: Knowledge of General Education information will improve Description Including pain rating scale, medication(s)/side effects and non-pharmacologic comfort measures Outcome: Progressing   

## 2023-06-02 ENCOUNTER — Telehealth: Payer: Self-pay

## 2023-06-02 ENCOUNTER — Other Ambulatory Visit: Payer: Self-pay | Admitting: Family

## 2023-06-02 DIAGNOSIS — E119 Type 2 diabetes mellitus without complications: Secondary | ICD-10-CM

## 2023-06-02 NOTE — Transitions of Care (Post Inpatient/ED Visit) (Signed)
   06/02/2023  Name: Troy Singh. MRN: [id number] DOB: Nov 14, 1970  Today's TOC FU Call Status: Today's TOC FU Call Status:: Unsuccessful Call (1st Attempt) Unsuccessful Call (1st Attempt) Date: 06/02/23  Attempted to reach the patient regarding the most recent Inpatient/ED visit.  Follow Up Plan: Additional outreach attempts will be made to reach the patient to complete the Transitions of Care (Post Inpatient/ED visit) call.   Signature  Robyne Peers, RN

## 2023-06-03 ENCOUNTER — Telehealth: Payer: Self-pay

## 2023-06-03 ENCOUNTER — Other Ambulatory Visit (HOSPITAL_COMMUNITY): Payer: Self-pay

## 2023-06-03 NOTE — Transitions of Care (Post Inpatient/ED Visit) (Signed)
   06/03/2023  Name: Troy Singh. MRN: [id number] DOB: [birth year]  Today's TOC FU Call Status: Today's TOC FU Call Status:: Unsuccessful Call (2nd Attempt) Unsuccessful Call (1st Attempt) Date: 06/02/23 Unsuccessful Call (2nd Attempt) Date: 06/03/23  Attempted to reach the patient regarding the most recent Inpatient/ED visit.  Follow Up Plan: Additional outreach attempts will be made to reach the patient to complete the Transitions of Care (Post Inpatient/ED visit) call.   Signature  Robyne Peers, RN

## 2023-06-04 ENCOUNTER — Telehealth: Payer: Self-pay

## 2023-06-04 NOTE — Transitions of Care (Post Inpatient/ED Visit) (Signed)
   06/04/2023  Name: Troy Singh. MRN: [id number] DOB: 01-Feb-1971  Today's TOC FU Call Status: Today's TOC FU Call Status:: Unsuccessful Call (3rd Attempt) Unsuccessful Call (1st Attempt) Date: 06/02/23 Unsuccessful Call (2nd Attempt) Date: 06/03/23 Unsuccessful Call (3rd Attempt) Date: 06/04/23  Attempted to reach the patient regarding the most recent Inpatient/ED visit.  Follow Up Plan: No further outreach attempts will be made at this time. We have been unable to contact the patient.  Letter sent to patient requesting he contact [facility] to schedule a follow up appointment as we have not been able to reach him   Signature  Robyne Peers, RN

## 2023-06-24 ENCOUNTER — Other Ambulatory Visit: Payer: Self-pay

## 2023-06-24 ENCOUNTER — Other Ambulatory Visit (HOSPITAL_COMMUNITY): Payer: Self-pay

## 2023-06-26 ENCOUNTER — Other Ambulatory Visit (HOSPITAL_COMMUNITY): Payer: Self-pay

## 2023-07-03 ENCOUNTER — Other Ambulatory Visit (HOSPITAL_COMMUNITY): Payer: Self-pay

## 2023-07-04 ENCOUNTER — Other Ambulatory Visit: Payer: Self-pay

## 2023-07-04 ENCOUNTER — Other Ambulatory Visit (HOSPITAL_COMMUNITY): Payer: Self-pay

## 2023-07-17 ENCOUNTER — Other Ambulatory Visit: Payer: Self-pay

## 2023-07-17 ENCOUNTER — Other Ambulatory Visit (HOSPITAL_COMMUNITY): Payer: Self-pay

## 2023-07-21 ENCOUNTER — Other Ambulatory Visit (HOSPITAL_COMMUNITY): Payer: Self-pay

## 2023-07-27 IMAGING — CT CT HEAD W/O CM
3 series · 14 of 47 positions shown, 16 images · non-contrast
Comparison: None.

CLINICAL DATA: Neuro deficit, acute, stroke suspected



[Series 2: head wo · axial · 0.47mm/px · z∈[-130,+10]mm · 8 of 34 slices shown, 10 images]
[im 3/34  brain]
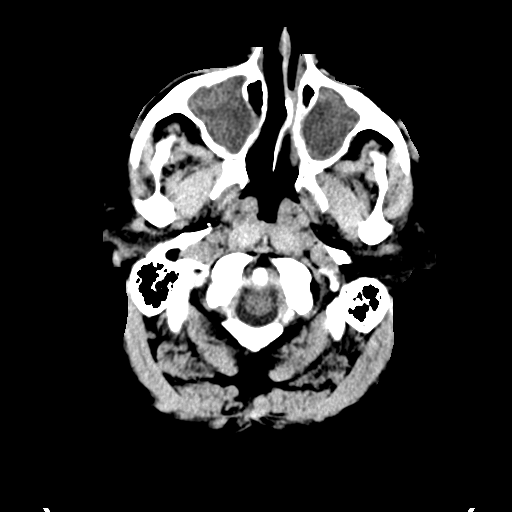
[im 3/34  bone]
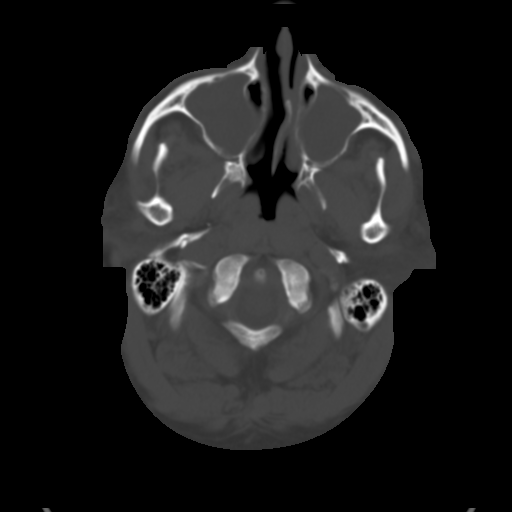
[im 7/34  brain]
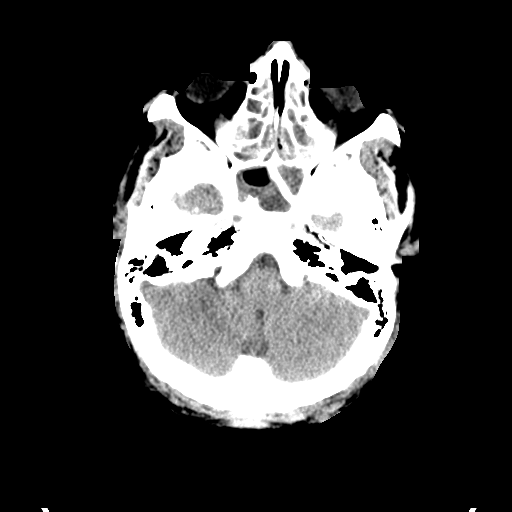
[im 11/34  brain]
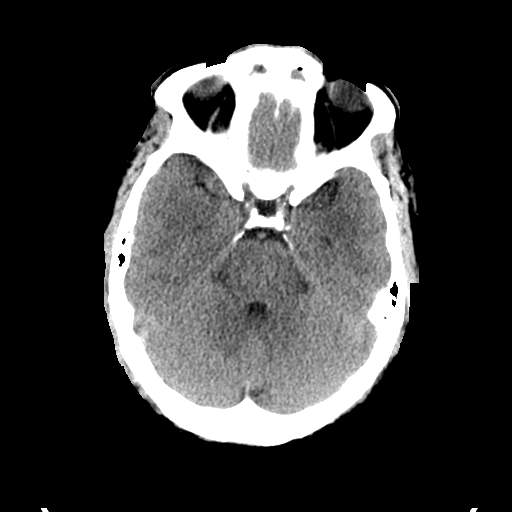
[im 15/34  brain]
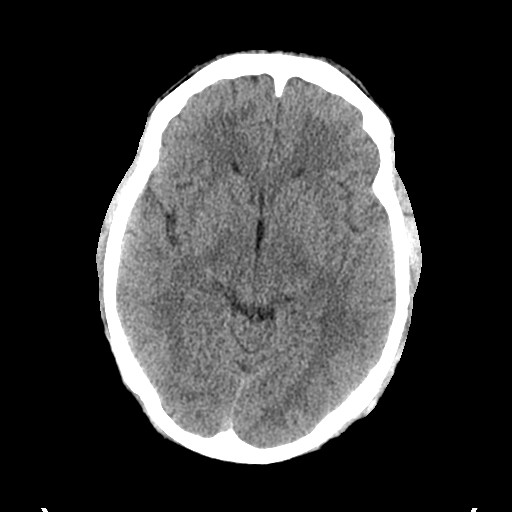
[im 19/34  brain]
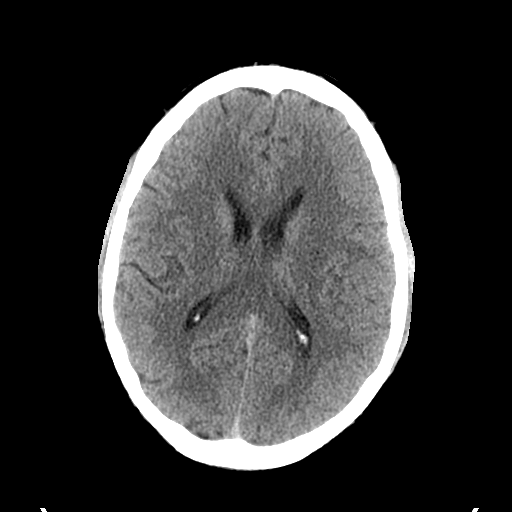
[im 19/34  bone]
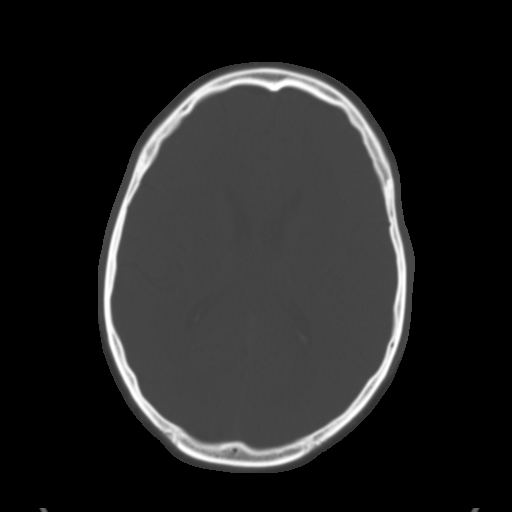
[im 23/34  brain]
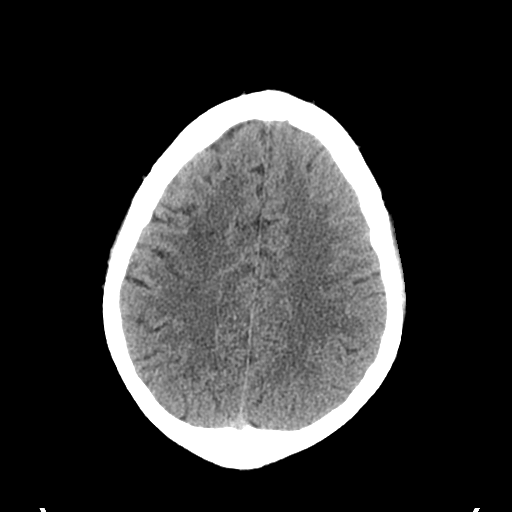
[im 27/34  brain]
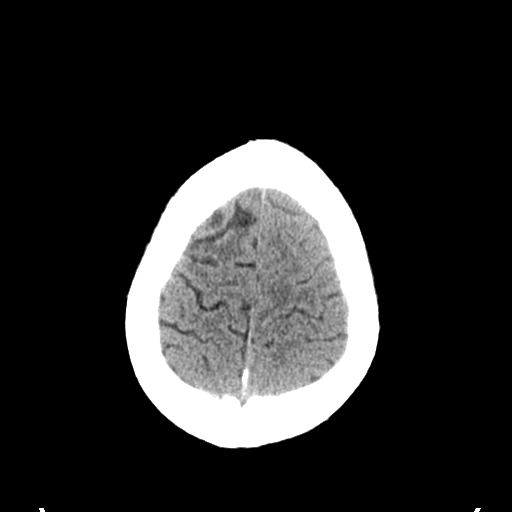
[im 31/34  brain]
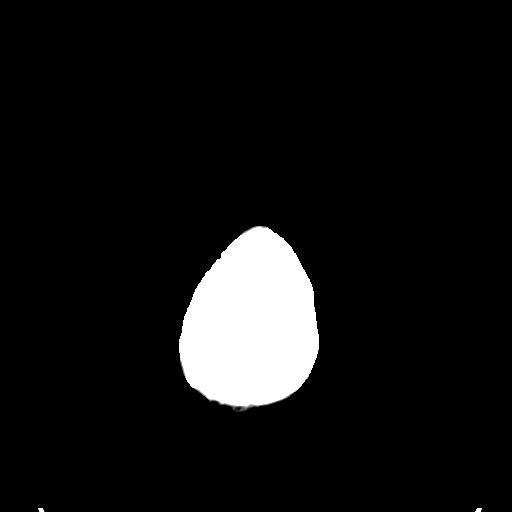

[Series 5: coronal soft tissue · coronal · 0.34mm/px · 3 of 84 slices shown]
[im 28/84  brain]
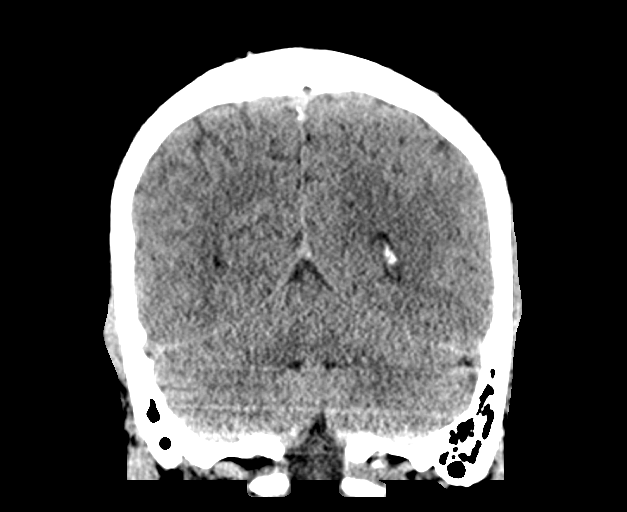
[im 37/84  brain]
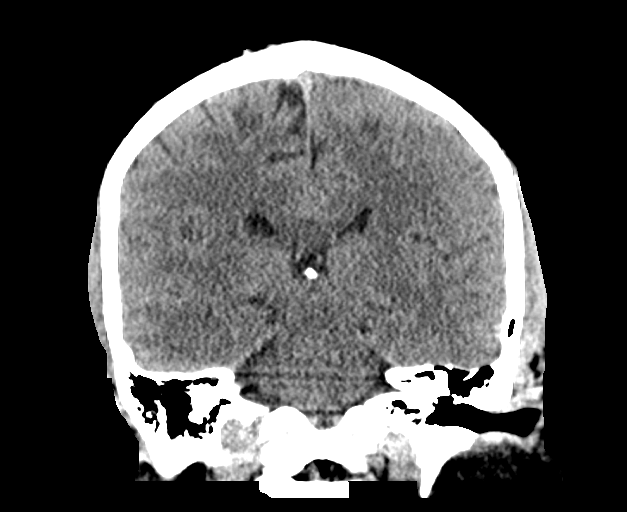
[im 47/84  brain]
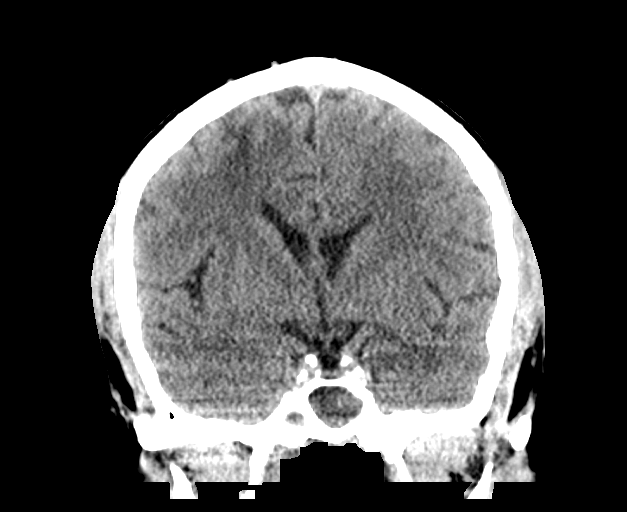

[Series 6: sagittal soft tissue · sagittal · 0.35mm/px · 3 of 68 slices shown]
[im 23/68  brain]
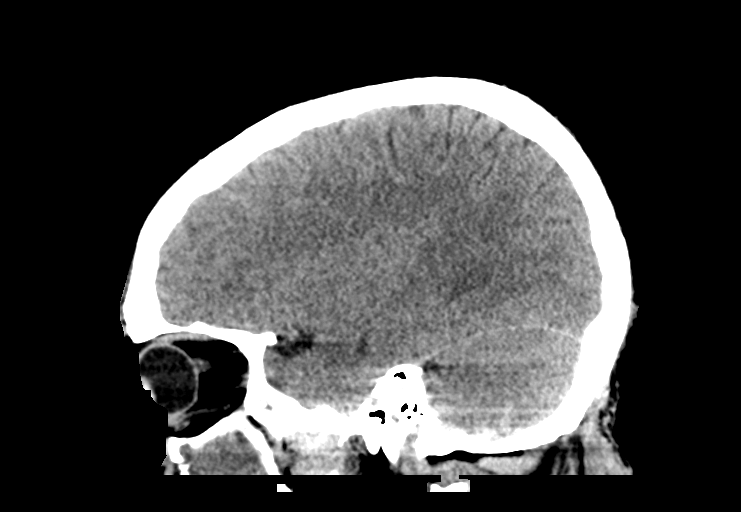
[im 34/68  brain]
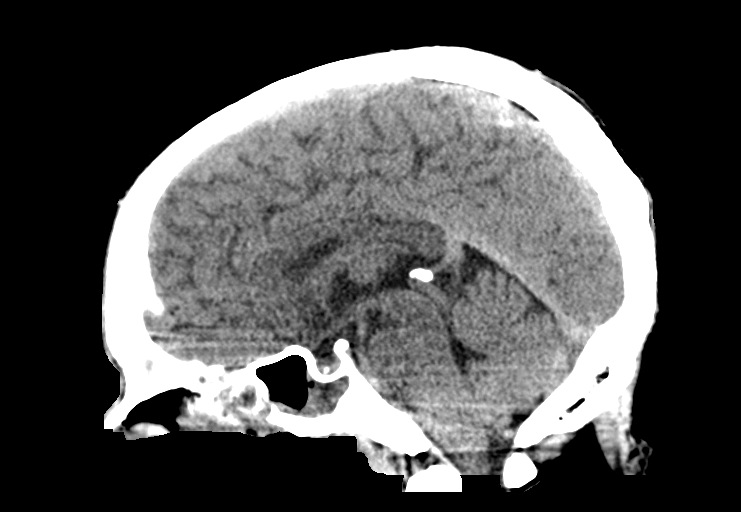
[im 45/68  brain]
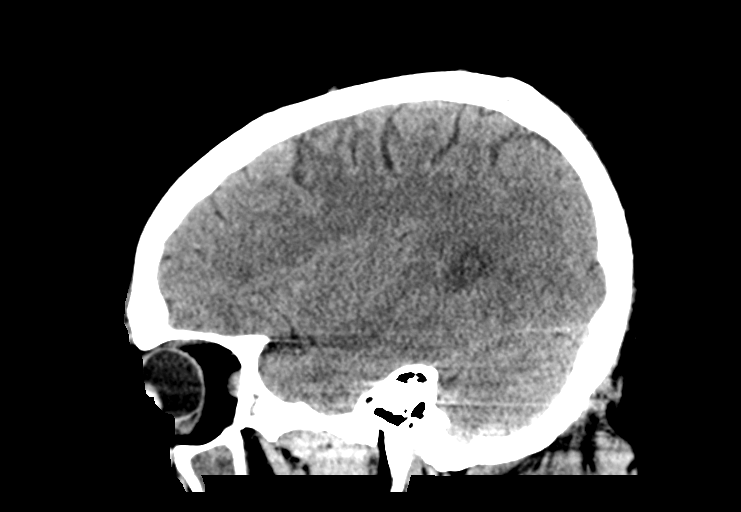

[14 of 47 positions shown; findings below may reference images not displayed]

FINDINGS: Brain: There is no acute intracranial hemorrhage, mass effect, or
edema. Age-indeterminate small cortical/subcortical right frontal
infarcts involving superior and middle gyrus as well as the medial
precentral gyrus. There is no extra-axial fluid collection.
Ventricles and sulci are within normal limits in size and
configuration.

Vascular: No hyperdense vessel.There is atherosclerotic
calcification at the skull base.

Skull: Calvarium is unremarkable.

Sinuses/Orbits: Pansinus mucosal thickening with near total
opacification.

Other: None.
IMPRESSION: No acute intracranial hemorrhage.

Age-indeterminate small cortical/subcortical infarcts of the right
frontal lobe. MRI is recommended.

Near total paranasal sinus opacification. Acute sinusitis is
possible in the appropriate setting.

## 2023-07-27 IMAGING — MR MR HEAD W/O CM
10 series · 48 of 48 positions shown · non-contrast
Comparison: None.

CLINICAL DATA: Neuro deficit, acute, stroke suspected left arm
weakness, concern for stroke

EXAM:
MRI HEAD WITHOUT CONTRAST
TECHNIQUE: Multiplanar, multiecho pulse sequences of the brain and surrounding
structures were obtained without intravenous contrast.

[Series 5: DWI · axial · 3.0mm · 1.36mm/px · z∈[-99,+42]mm · 8 of 96 slices shown (1 of 2)]
[im 1/96]
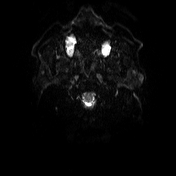
[im 14/96]
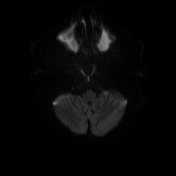
[im 28/96]
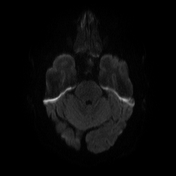
[im 41/96]
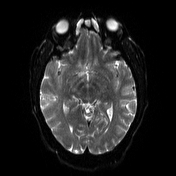
[im 55/96]
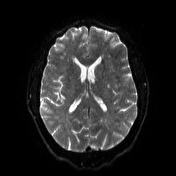
[im 68/96]
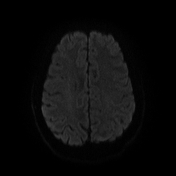
[im 82/96]
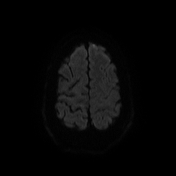
[im 96/96]
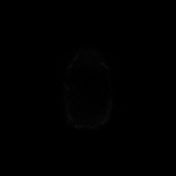

[Series 6: DWI · axial · 3.0mm · 1.36mm/px · z∈[-99,+42]mm · 4 of 48 slices shown (2 of 2)]
[im 1/48]
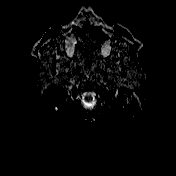
[im 16/48]
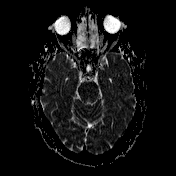
[im 32/48]
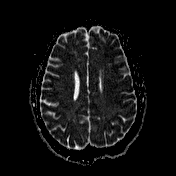
[im 48/48]
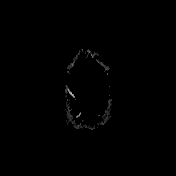

[Series 7: T1 · sagittal · 5.0mm · 0.75mm/px · 2 of 24 slices shown (1 of 2)]
[im 1/24]
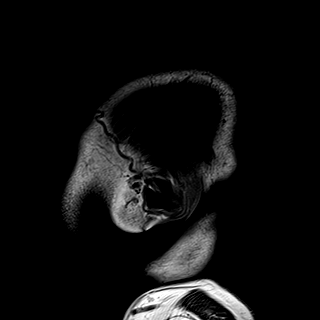
[im 24/24]
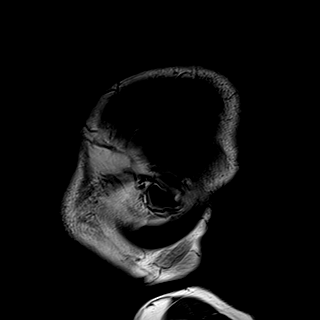

[Series 8: T2 · axial · 5.0mm · 0.62mm/px · z∈[-104,+46]mm · 2 of 24 slices shown (1 of 2)]
[im 1/24]
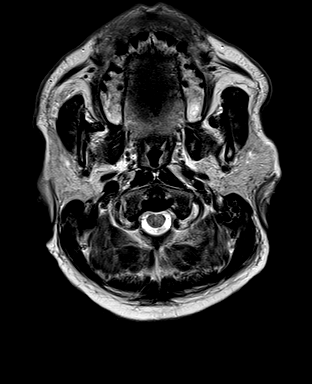
[im 24/24]
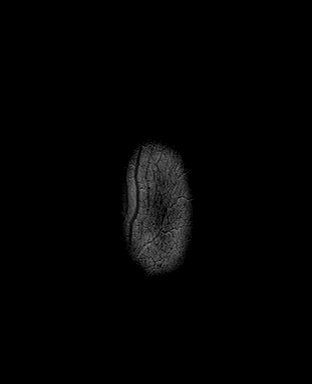

[Series 9: swi_images · axial · 3.0mm · 0.75mm/px · z∈[-136,+77]mm · 6 of 72 slices shown]
[im 1/72]
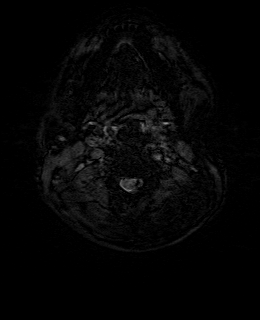
[im 15/72]
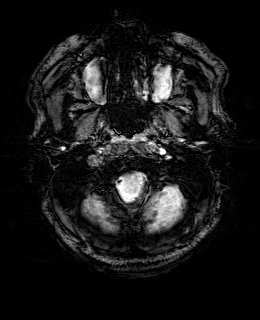
[im 29/72]
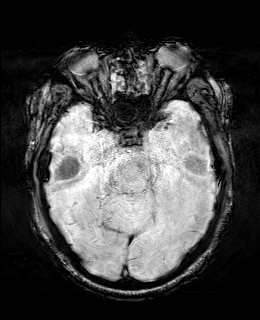
[im 43/72]
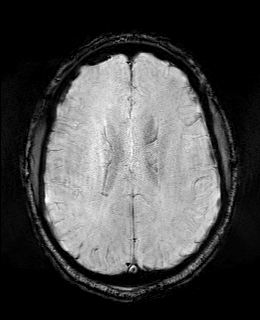
[im 57/72]
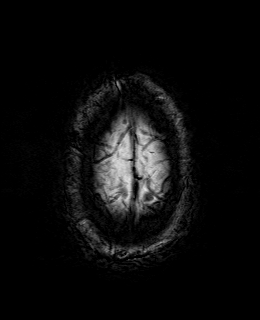
[im 72/72]
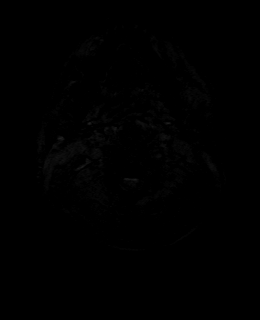

[Series 11: FLAIR · axial · 3.0mm · 0.75mm/px · z∈[-106,+47]mm · 4 of 52 slices shown]
[im 1/52]
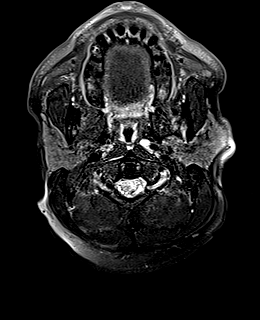
[im 18/52]
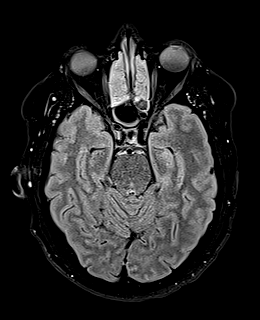
[im 35/52]
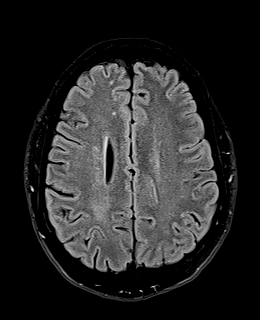
[im 52/52]
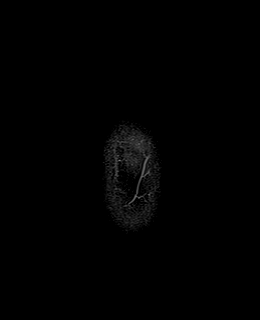

[Series 12: T1 · axial · 1.0mm · 0.94mm/px · z∈[-101,+42]mm · 12 of 144 slices shown (2 of 2)]
[im 1/144]
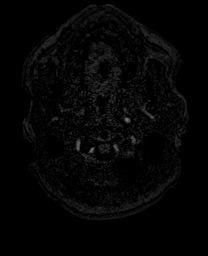
[im 14/144]
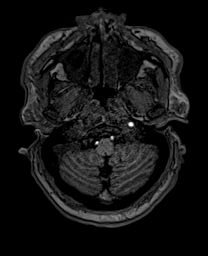
[im 27/144]
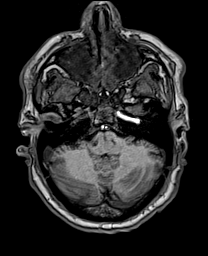
[im 40/144]
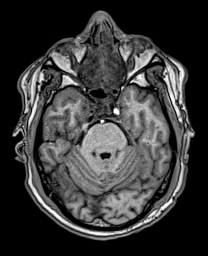
[im 53/144]
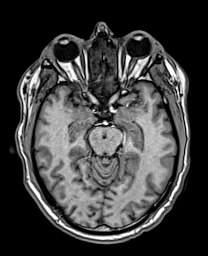
[im 66/144]
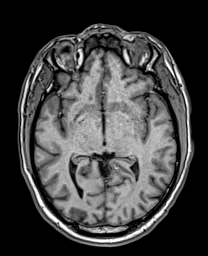
[im 79/144]
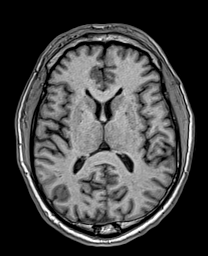
[im 92/144]
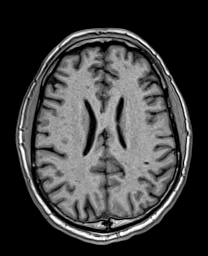
[im 105/144]
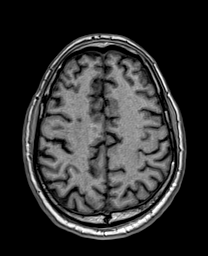
[im 118/144]
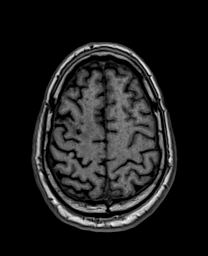
[im 131/144]
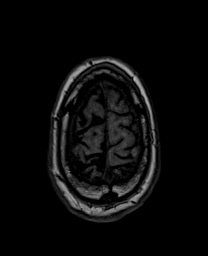
[im 144/144]
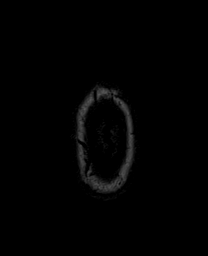

[Series 13: cor dwi_tracew · coronal · 5.0mm · 1.53mm/px · 5 of 56 slices shown]
[im 1/56]
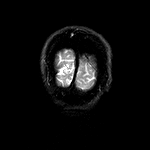
[im 14/56]
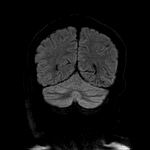
[im 28/56]
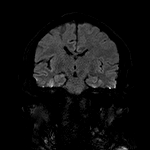
[im 42/56]
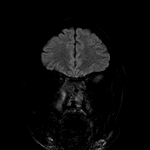
[im 56/56]
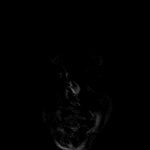

[Series 14: cor dwi_adc · coronal · 5.0mm · 1.53mm/px · 2 of 28 slices shown]
[im 1/28]
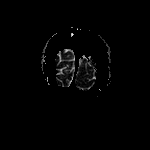
[im 28/28]
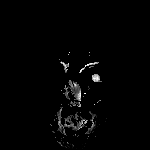

[Series 15: T2 · coronal · 5.0mm · 0.57mm/px · 3 of 36 slices shown (2 of 2)]
[im 1/36]
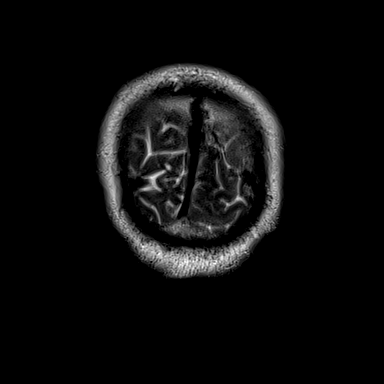
[im 18/36]
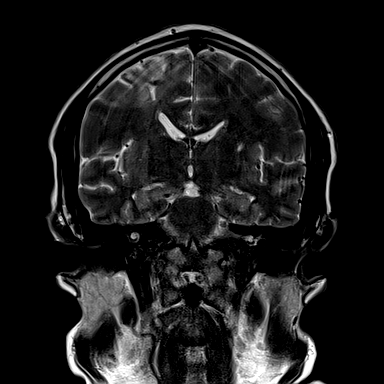
[im 36/36]
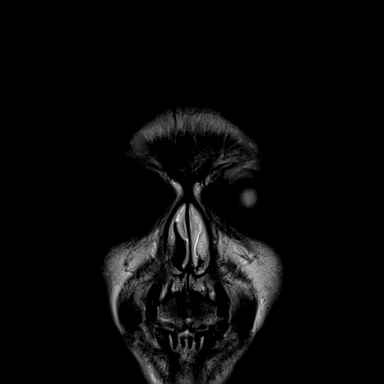

[48 of 48 positions shown; findings below may reference images not displayed]

FINDINGS: Brain: There is no acute infarction or intracranial hemorrhage.
Cortical/subcortical infarcts of the right frontal lobe seen on CT
are chronic. There is some associated susceptibility reflecting
chronic blood products. Additional scattered small foci of T2
hyperintensity in the supratentorial white matter are nonspecific
but probably reflect mild chronic microvascular ischemic changes.
Minimal foci of bilateral putaminal susceptibility likely reflect
chronic microhemorrhages secondary to hypertension. There is no
intracranial mass, mass effect, or edema. There is no hydrocephalus
or extra-axial fluid collection. Ventricles and sulci are normal in
size and configuration.

Vascular: Major vessel flow voids at the skull base are preserved.

Skull and upper cervical spine: Normal marrow signal is preserved.

Sinuses/Orbits: As noted on the prior CT, there is near total
opacification of the paranasal sinuses. Orbits are unremarkable.

Other: Sella is unremarkable.  Mastoid air cells are clear.
IMPRESSION: Chronic right frontal cortical/subcortical infarcts. No acute
infarct, hemorrhage, or mass.

Mild chronic microvascular ischemic changes. Mild burden of chronic
microhemorrhages secondary to hypertension.

## 2024-06-16 ENCOUNTER — Other Ambulatory Visit (HOSPITAL_COMMUNITY): Payer: Self-pay

## 2024-06-16 ENCOUNTER — Emergency Department (HOSPITAL_COMMUNITY)
Admission: EM | Admit: 2024-06-16 | Discharge: 2024-06-16 | Disposition: A | Discharge status: Home / Self Care | Payer: Self-pay | Attending: Emergency Medicine | Admitting: Emergency Medicine

## 2024-06-16 ENCOUNTER — Encounter (HOSPITAL_COMMUNITY): Payer: Self-pay

## 2024-06-16 DIAGNOSIS — Z7984 Long term (current) use of oral hypoglycemic drugs: Secondary | ICD-10-CM | POA: Insufficient documentation

## 2024-06-16 DIAGNOSIS — N189 Chronic kidney disease, unspecified: Secondary | ICD-10-CM | POA: Insufficient documentation

## 2024-06-16 DIAGNOSIS — I130 Hypertensive heart and chronic kidney disease with heart failure and stage 1 through stage 4 chronic kidney disease, or unspecified chronic kidney disease: Secondary | ICD-10-CM | POA: Insufficient documentation

## 2024-06-16 DIAGNOSIS — I5020 Unspecified systolic (congestive) heart failure: Secondary | ICD-10-CM | POA: Insufficient documentation

## 2024-06-16 DIAGNOSIS — Z7982 Long term (current) use of aspirin: Secondary | ICD-10-CM | POA: Insufficient documentation

## 2024-06-16 DIAGNOSIS — M109 Gout, unspecified: Secondary | ICD-10-CM | POA: Insufficient documentation

## 2024-06-16 DIAGNOSIS — I10 Essential (primary) hypertension: Secondary | ICD-10-CM

## 2024-06-16 DIAGNOSIS — Z79899 Other long term (current) drug therapy: Secondary | ICD-10-CM | POA: Insufficient documentation

## 2024-06-16 DIAGNOSIS — E1122 Type 2 diabetes mellitus with diabetic chronic kidney disease: Secondary | ICD-10-CM | POA: Insufficient documentation

## 2024-06-16 LAB — I-STAT CHEM 8, ED
BUN: 31 mg/dL — ABNORMAL HIGH (ref 6–20)
Calcium, Ion: 1.15 mmol/L (ref 1.15–1.40)
Chloride: 106 mmol/L (ref 98–111)
Creatinine, Ser: 1.8 mg/dL — ABNORMAL HIGH (ref 0.61–1.24)
Glucose, Bld: 114 mg/dL — ABNORMAL HIGH (ref 70–99)
HCT: 38 % — ABNORMAL LOW (ref 39.0–52.0)
Hemoglobin: 12.9 g/dL — ABNORMAL LOW (ref 13.0–17.0)
Potassium: 4.7 mmol/L (ref 3.5–5.1)
Sodium: 140 mmol/L (ref 135–145)
TCO2: 25 mmol/L (ref 22–32)

## 2024-06-16 LAB — CBG MONITORING, ED: Glucose-Capillary: 119 mg/dL — ABNORMAL HIGH (ref 70–99)

## 2024-06-16 MED ORDER — INDOMETHACIN 25 MG PO CAPS
25.0000 mg | ORAL_CAPSULE | Freq: Three times a day (TID) | ORAL | 0 refills | Status: DC | PRN
  Filled 2024-06-16: qty 30, 10d supply, fill #0

## 2024-06-16 MED ORDER — AMLODIPINE BESYLATE 5 MG PO TABS
5.0000 mg | ORAL_TABLET | Freq: Every day | ORAL | 0 refills | Status: DC
  Filled 2024-06-16: qty 30, 30d supply, fill #0

## 2024-06-16 MED ORDER — PREDNISONE 10 MG PO TABS
ORAL_TABLET | Freq: Every day | ORAL | 0 refills | Status: DC
  Filled 2024-06-16: qty 42, 12d supply, fill #0

## 2024-06-16 NOTE — ED Provider Notes (Signed)
 [place] EMERGENCY DEPARTMENT AT [facility] Provider Note   CSN: [id number] Arrival date & time: 06/16/24  1615     Patient presents with: Joint Swelling and Leg Swelling   Troy Singh. is a 53 y.o. male with past medical history of HTN, non-insulin -dependent T2DM, DCM, HFrEF, gout presents to Emergency Department for evaluation of right knee swelling that has been occurring for the past 2 days.  Reports pain worsens with weightbearing and ambulation.  Has history of gout in toes, left knee that was diagnosed with aspiration in 2020.  Reports this feels similar to gout flares in the past.  Denies fevers, traumatic injury   HPI     Prior to Admission medications   Medication Sig Start Date End Date Taking? Authorizing Provider  amLODipine  (NORVASC ) 5 MG tablet Take 1 tablet (5 mg total) by mouth daily. 06/16/24 07/16/24 Yes Minnie Tinnie BRAVO, PA  indomethacin  (INDOCIN ) 25 MG capsule Take 1 capsule (25 mg total) by mouth 3 (three) times daily as needed. 06/16/24  Yes Minnie Tinnie BRAVO, PA  predniSONE  (STERAPRED UNI-PAK 21 TAB) 10 MG (21) TBPK tablet Take by mouth daily. Take 6 tabs by mouth daily  for 2 days, then 5 tabs for 2 days, then 4 tabs for 2 days, then 3 tabs for 2 days, 2 tabs for 2 days, then 1 tab by mouth daily for 2 days 06/16/24  Yes Minnie Tinnie BRAVO, PA  acetaminophen  (TYLENOL ) 500 MG tablet Take 1,000 mg by mouth every 6 (six) hours as needed for mild pain.    [provider]  allopurinol  (ZYLOPRIM ) 100 MG tablet Take 1 tablet (100 mg total) by mouth daily. Patient not taking: Reported on 05/30/2023 06/29/22 07/04/23  Kathrin Mignon DASEN, MD  aspirin  EC 81 MG tablet Take 1 tablet (81 mg total) by mouth daily. Swallow whole. Patient not taking: Reported on 05/30/2023 06/26/22   Gonfa, Taye T, MD  atorvastatin  (LIPITOR) 40 MG tablet Take 1 tablet (40 mg total) by mouth daily. Patient not taking: Reported on 05/30/2023 07/16/22   Colletta Manuelita Garre, PA-C   carvedilol  (COREG ) 6.25 MG tablet Take 1 tablet (6.25 mg total) by mouth 2 (two) times daily with a meal. Patient not taking: Reported on 05/30/2023 07/30/22   Sabharwal, Ria, DO  furosemide  (LASIX ) 40 MG tablet Take 1 tablet (40 mg total) by mouth daily. Patient not taking: Reported on 05/30/2023 07/16/22   Colletta Manuelita Garre, PA-C  hydrALAZINE  (APRESOLINE ) 50 MG tablet Take 1 tablet (50 mg total) by mouth every 8 (eight) hours. Patient not taking: Reported on 05/30/2023 07/16/22   Colletta Manuelita Garre, PA-C  isosorbide  mononitrate (IMDUR ) 30 MG 24 hr tablet Take 1 tablet (30 mg total) by mouth daily. Patient not taking: Reported on 05/30/2023 07/16/22   Colletta Manuelita Garre, PA-C  losartan  (COZAAR ) 50 MG tablet Take 1 tablet (50 mg total) by mouth daily. 06/01/23   Dorinda Drue DASEN, MD  metFORMIN  (GLUCOPHAGE -XR) 500 MG 24 hr tablet Take 1 tablet (500 mg total) by mouth daily with breakfast. Patient not taking: Reported on 05/30/2023 06/28/22 09/26/22  Jaycee Greig PARAS, NP  spironolactone  (ALDACTONE ) 25 MG tablet Take 1 tablet (25 mg total) by mouth daily. Patient not taking: Reported on 05/30/2023 07/22/22 08/03/23  Colletta Manuelita Garre, PA-C    Allergies: Patient has no known allergies.    Review of Systems  Musculoskeletal:  Positive for arthralgias.    Updated Vital Signs BP (!) 180/90 (BP Location: Left Arm)  Pulse 85   Temp 98.4 F (36.9 C) (Oral)   Resp 16   Ht 5' 3 (1.6 m)   Wt 87.6 kg   SpO2 95%   BMI 34.21 kg/m   Physical Exam Vitals and nursing note reviewed.  Constitutional:      General: He is not in acute distress.    Appearance: Normal appearance. He is not ill-appearing.  HENT:     Head: Normocephalic and atraumatic.  Eyes:     General: Lids are normal. Vision grossly intact. No visual field deficit.    Extraocular Movements: Extraocular movements intact.     Right eye: Normal extraocular motion and no nystagmus.     Left eye: Normal extraocular motion and no  nystagmus.     Conjunctiva/sclera: Conjunctivae normal.     Pupils: Pupils are equal, round, and reactive to light.  Cardiovascular:     Rate and Rhythm: Normal rate.     Pulses:          Dorsalis pedis pulses are 2+ on the right side and 2+ on the left side.     Heart sounds: Normal heart sounds.  Pulmonary:     Effort: Pulmonary effort is normal. No respiratory distress.     Breath sounds: Normal breath sounds.  Musculoskeletal:     Cervical back: Normal range of motion and neck supple. No rigidity.     Right lower leg: No edema.     Left lower leg: No edema.     Comments: Mild generalized swelling localized to right knee. No erythema nor warmth. Able to passively and actively fully flex and extend right knee, right hip, right ankle.  No swelling, tenderness, decreased ROM to left hip, left knee, left ankle.  Skin:    Coloration: Skin is not jaundiced or pale.  Neurological:     General: No focal deficit present.     Mental Status: He is alert and oriented to person, place, and time. Mental status is at baseline.     GCS: GCS eye subscore is 4. GCS verbal subscore is 5. GCS motor subscore is 6.     Cranial Nerves: No cranial nerve deficit, dysarthria or facial asymmetry.     Sensory: No sensory deficit.     Motor: No weakness, abnormal muscle tone, seizure activity or pronator drift.     Coordination: Coordination normal. Finger-Nose-Finger Test and Heel to [facility] Test normal.     Deep Tendon Reflexes: Reflexes normal.     Comments: A&Ox3. Motor 5/5 and Sensation 2/2 of BUE and BLE. No visual disturbances. No dizziness. No speech deficits      (all labs ordered are listed, but only abnormal results are displayed) Labs Reviewed  I-STAT CHEM 8, ED - Abnormal; Notable for the following components:      Result Value   BUN 31 (*)    Creatinine, Ser 1.80 (*)    Glucose, Bld 114 (*)    Hemoglobin 12.9 (*)    HCT 38.0 (*)    All other components within normal limits  CBG MONITORING,  ED - Abnormal; Notable for the following components:   Glucose-Capillary 119 (*)    All other components within normal limits    EKG: None  Radiology: No results found.   Medications Ordered in the ED - No data to display  Medical Decision Making Risk Prescription drug management.   Patient presents to the ED for concern of right knee pain, this involves an extensive number of treatment options, and is a complaint that carries with it a high risk of complications and morbidity.  The differential diagnosis includes gout, septic joint, meniscal injury, ligamentous injury, fracture, cellulitis, vascular injury, DVT   Co morbidities that complicate the patient evaluation  History of uncontrolled hypertension, gout.  See HPI   Additional history obtained:  Additional history obtained from Nursing and Outside Medical Records   External records from outside source obtained and reviewed including triage RN note, ED note from 2020   Lab Tests:  I Ordered, and personally interpreted labs.  The pertinent results include:   Creatinine 1.08 CBG 114 Hgb 12.9    Medicines ordered and prescription drug management:  I ordered medication including indomethacin , prednisone , amlodipine  for gout flare, HTN I have reviewed the patients home medicines and have made adjustments as needed     Problem List / ED Course:  Gout of right knee Vital signs without fever no tachycardia. Right knee is not diffusely erythematous, warm.  No fever no tachycardia in ED.  Able to fully flex and extend passively and actively right knee.  There is no erythema, streaking to lower extremity.  Low suspicion for septic joint nor cellulitis Denies traumatic injury.  Low suspicion for fracture, meniscal injury, ligamentous injury Low suspicion of DVT as he has no pedal edema bilaterally.  No calf tenderness.  No history of DVT in past. Triage note notes BLE swelling but  patient reports that this has been on and off.  Has history of HF.  No current swelling to legs nor signs of fluid overload or heart failure exacerbation.  No complaints of chest pain or shortness of breath.  Maintains oxygen saturation without supplementation Low suspicion for vascular injury or ischemic limb.  Easily palpable DP pulse bilaterally.  Brisk cap refill. Neurologically intact with no sensory nor motor deficits.  Able to ambulate however with a mild limp secondary to right knee pain, gout flare Does have a history of gout in the past and reports that this feels similar.  It is mildly swollen and seems consistent with gout flare.  I reviewed ED charts from 2020 and patient has had gout flares in toes, left knee that was diagnosed with aspiration in past.  He has been on indomethacin  and steroids in the past with improvement of symptoms.  Patient has CKD with creatinine 1.8 per his baseline.  He has taken indomethacin  without worsening of creatinine in past.  I did discuss to not take ibuprofen , Advil , Aleve with this as it may worsen his kidney function.  He also has history of diabetes that is not insulin -dependent.  Sugar today 115.  He does not check his sugars at home but has taken steroids in the past without knowing worsening sugars or other complications.  I did discuss to monitor his sugars  HTN Unfortunately, patient is not on hypertensive meds currently secondary to no insurance, financial difficulties.  Has been on multiple hypertensive meds in the past. Do not think the patient is having hypertensive emergency at this time.  He has no complaints of headache, visual disturbances, chest pain, shortness of breath.  Creatinine is at patient's baseline. Did provide patient with hypertensive information packet as well as amlodipine  for hypertension, renal protective properties.  I also ordered the [facility] pharmacy management as patient does not have a PCP.  Had extensive conversation with  patient regarding CKD, HTN, HF, diabetes importance of establishing primary care provider with multiple comorbidities needing pharmacological intervention and observation.  He expresses understanding and will try to establish care with primary care provider Did provide him with [place] many health and wellness for PCP establishment   Reevaluation:  After the interventions noted above, I reevaluated the patient and found that they have :stayed the same   Social Determinants of Health:  No insurance. No pcp - provided community health and wellness clinic as well as [facility] care management for blood pressure, prescription medication management   Dispostion:  After consideration of the diagnostic results and the patients response to treatment, I feel that the patent would benefit from outpatient management, PCP establishment.   Discussed ED workup, disposition, return to ED precautions with patient who expresses understanding agrees with plan.  All questions answered to their satisfaction.  They are agreeable to plan.  Discharge instructions provided on paperwork  Final diagnoses:  Acute gout of right knee, unspecified cause  Uncontrolled hypertension    ED Discharge Orders          Ordered    indomethacin  (INDOCIN ) 25 MG capsule  3 times daily PRN        06/16/24 1740    predniSONE  (STERAPRED UNI-PAK 21 TAB) 10 MG (21) TBPK tablet  Daily        06/16/24 1740    amLODipine  (NORVASC ) 5 MG tablet  Daily        06/16/24 1740    Referral to [facility] Care Management       Comments: Pharmacy Medication Management for Uncontrolled hypertension in the ED   06/16/24 1740             Minnie Tinnie BRAVO, PA 06/16/24 1759    Patt Alm Macho, MD 06/16/24 2231

## 2024-06-16 NOTE — ED Triage Notes (Signed)
 Pt came in POV presents w/ R.Knee swelling & bilateral feet swelling started Monday. Hx Gout & HF. Visible nodule on inner side of r.knee, tender to touch no redness noted. Pt able to ambulate with difficulty. Denies N&V, fever.

## 2024-06-16 NOTE — ED Provider Triage Note (Signed)
 Emergency Medicine Provider Triage Evaluation Note  Troy Singh. , a 53 y.o. male  was evaluated in triage.  Pt complains of right knee pain for past two days. Hx of gout but normally has gout in feet.  Has been elevating feet. Been taking ibuprofen   Review of Systems  Positive: See hpi Negative: fevers  Physical Exam  BP (!) 180/90 (BP Location: Left Arm)   Pulse 85   Temp 98.4 F (36.9 C) (Oral)   Resp 16   Ht 5' 3 (1.6 m)   Wt 87.6 kg   SpO2 95%   BMI 34.21 kg/m  Gen:   Awake, no distress   Resp:  Normal effort  MSK:   Moves extremities without difficulty Other:    Medical Decision Making  Medically screening exam initiated at 5:01 PM.  Appropriate orders placed.  Troy Singh. was informed that the remainder of the evaluation will be completed by another provider, this initial triage assessment does not replace that evaluation, and the importance of remaining in the ED until their evaluation is complete.   Troy Tinnie BRAVO, PA 06/16/24 [phone]

## 2024-06-16 NOTE — Discharge Instructions (Signed)

## 2024-06-17 ENCOUNTER — Other Ambulatory Visit (HOSPITAL_COMMUNITY): Payer: Self-pay

## 2024-06-18 ENCOUNTER — Other Ambulatory Visit (HOSPITAL_COMMUNITY): Payer: Self-pay

## 2024-06-21 ENCOUNTER — Telehealth: Payer: Self-pay | Admitting: *Deleted

## 2024-06-21 NOTE — Progress Notes (Unsigned)
 Care Guide Pharmacy Note  06/21/2024 Name: Troy Singh. MRN: [id number] DOB: June 13, 1971  Referred By: Jaycee Greig PARAS, NP Reason for referral: Call Attempt #1 and Complex Care Management (Outreach to schedule referral with pharmacist )   Troy Claudene Raddle. is a 53 y.o. year old male who is a primary care patient of Jaycee Greig PARAS, NP.  Troy Claudene Raddle. was referred to the pharmacist for assistance related to: HTN  An unsuccessful telephone outreach was attempted today to contact the patient who was referred to the pharmacy team for assistance with medication management. Additional attempts will be made to contact the patient.  Troy Singh, CMA   [facility], [facility] Guide Direct Dial: [phone]  Fax: [phone] Website: [email or web address]

## 2024-06-22 NOTE — Progress Notes (Unsigned)
 Care Guide Pharmacy Note  06/22/2024 Name: Troy Singh. MRN: [id number] DOB: 06-27-1971  Referred By: Jaycee Greig PARAS, NP Reason for referral: Call Attempt #1 and Complex Care Management (Outreach to schedule referral with pharmacist )   Troy Claudene Raddle. is a 53 y.o. year old male who is a primary care patient of Jaycee Greig PARAS, NP.  Troy Claudene Raddle. was referred to the pharmacist for assistance related to: HTN  A second unsuccessful telephone outreach was attempted today to contact the patient who was referred to the pharmacy team for assistance with medication management. Additional attempts will be made to contact the patient.  Thedford Franks, CMA [place]  [facility], [facility] Guide Direct Dial: [phone]  Fax: [phone] Website: [email or web address]

## 2024-06-23 NOTE — Progress Notes (Signed)
 Care Guide Pharmacy Note  06/23/2024 Name: Troy Singh. MRN: [id number] DOB: 1971-06-13  Referred By: Jaycee Greig PARAS, NP Reason for referral: Call Attempt #1 and Complex Care Management (Outreach to schedule referral with pharmacist )   Troy Claudene Raddle. is a 53 y.o. year old male who is a primary care patient of Jaycee Greig PARAS, NP.  Troy Claudene Raddle. was referred to the pharmacist for assistance related to: HTN  A third unsuccessful telephone outreach was attempted today to contact the patient who was referred to the pharmacy team for assistance with medication management. The Population Health team is pleased to engage with this patient at any time in the future upon receipt of referral and should he/she be interested in assistance from the Population Health team.  Troy Singh, CMA [facility]  [facility], [facility] Guide Direct Dial: [phone]  Fax: [phone] Website: [email or web address]

## 2024-07-05 ENCOUNTER — Inpatient Hospital Stay (HOSPITAL_COMMUNITY)
Admission: EM | Admit: 2024-07-05 | Discharge: 2024-07-08 | DRG: 291 | Disposition: A | Discharge status: Home / Self Care | Payer: Self-pay | Attending: Internal Medicine | Admitting: Internal Medicine

## 2024-07-05 ENCOUNTER — Other Ambulatory Visit: Payer: Self-pay

## 2024-07-05 ENCOUNTER — Encounter (HOSPITAL_COMMUNITY): Payer: Self-pay

## 2024-07-05 ENCOUNTER — Emergency Department (HOSPITAL_COMMUNITY): Payer: Self-pay

## 2024-07-05 DIAGNOSIS — I5020 Unspecified systolic (congestive) heart failure: Secondary | ICD-10-CM

## 2024-07-05 DIAGNOSIS — E66811 Obesity, class 1: Secondary | ICD-10-CM | POA: Diagnosis present

## 2024-07-05 DIAGNOSIS — Z6834 Body mass index (BMI) 34.0-34.9, adult: Secondary | ICD-10-CM

## 2024-07-05 DIAGNOSIS — Z8673 Personal history of transient ischemic attack (TIA), and cerebral infarction without residual deficits: Secondary | ICD-10-CM

## 2024-07-05 DIAGNOSIS — J9601 Acute respiratory failure with hypoxia: Secondary | ICD-10-CM | POA: Diagnosis present

## 2024-07-05 DIAGNOSIS — I428 Other cardiomyopathies: Secondary | ICD-10-CM | POA: Diagnosis present

## 2024-07-05 DIAGNOSIS — E8809 Other disorders of plasma-protein metabolism, not elsewhere classified: Secondary | ICD-10-CM | POA: Diagnosis present

## 2024-07-05 DIAGNOSIS — Z66 Do not resuscitate: Secondary | ICD-10-CM | POA: Diagnosis present

## 2024-07-05 DIAGNOSIS — N1831 Chronic kidney disease, stage 3a: Secondary | ICD-10-CM | POA: Diagnosis present

## 2024-07-05 DIAGNOSIS — Z7984 Long term (current) use of oral hypoglycemic drugs: Secondary | ICD-10-CM

## 2024-07-05 DIAGNOSIS — Z79899 Other long term (current) drug therapy: Secondary | ICD-10-CM

## 2024-07-05 DIAGNOSIS — E119 Type 2 diabetes mellitus without complications: Secondary | ICD-10-CM

## 2024-07-05 DIAGNOSIS — Z833 Family history of diabetes mellitus: Secondary | ICD-10-CM

## 2024-07-05 DIAGNOSIS — I2489 Other forms of acute ischemic heart disease: Secondary | ICD-10-CM | POA: Diagnosis present

## 2024-07-05 DIAGNOSIS — E1122 Type 2 diabetes mellitus with diabetic chronic kidney disease: Secondary | ICD-10-CM | POA: Diagnosis present

## 2024-07-05 DIAGNOSIS — D509 Iron deficiency anemia, unspecified: Secondary | ICD-10-CM | POA: Diagnosis present

## 2024-07-05 DIAGNOSIS — I160 Hypertensive urgency: Secondary | ICD-10-CM | POA: Diagnosis present

## 2024-07-05 DIAGNOSIS — N179 Acute kidney failure, unspecified: Secondary | ICD-10-CM | POA: Diagnosis present

## 2024-07-05 DIAGNOSIS — Z8249 Family history of ischemic heart disease and other diseases of the circulatory system: Secondary | ICD-10-CM

## 2024-07-05 DIAGNOSIS — R7989 Other specified abnormal findings of blood chemistry: Secondary | ICD-10-CM

## 2024-07-05 DIAGNOSIS — I130 Hypertensive heart and chronic kidney disease with heart failure and stage 1 through stage 4 chronic kidney disease, or unspecified chronic kidney disease: Principal | ICD-10-CM | POA: Diagnosis present

## 2024-07-05 DIAGNOSIS — E7800 Pure hypercholesterolemia, unspecified: Secondary | ICD-10-CM | POA: Diagnosis present

## 2024-07-05 DIAGNOSIS — I10 Essential (primary) hypertension: Secondary | ICD-10-CM | POA: Diagnosis present

## 2024-07-05 DIAGNOSIS — Z91148 Patient's other noncompliance with medication regimen for other reason: Secondary | ICD-10-CM

## 2024-07-05 DIAGNOSIS — I472 Ventricular tachycardia, unspecified: Secondary | ICD-10-CM | POA: Diagnosis present

## 2024-07-05 DIAGNOSIS — Z7982 Long term (current) use of aspirin: Secondary | ICD-10-CM

## 2024-07-05 DIAGNOSIS — Z59868 Other specified financial insecurity: Secondary | ICD-10-CM

## 2024-07-05 DIAGNOSIS — I5023 Acute on chronic systolic (congestive) heart failure: Secondary | ICD-10-CM

## 2024-07-05 DIAGNOSIS — M109 Gout, unspecified: Secondary | ICD-10-CM | POA: Diagnosis present

## 2024-07-05 DIAGNOSIS — E6609 Other obesity due to excess calories: Secondary | ICD-10-CM

## 2024-07-05 DIAGNOSIS — Z87891 Personal history of nicotine dependence: Secondary | ICD-10-CM

## 2024-07-05 DIAGNOSIS — I2510 Atherosclerotic heart disease of native coronary artery without angina pectoris: Secondary | ICD-10-CM | POA: Diagnosis present

## 2024-07-05 DIAGNOSIS — I5043 Acute on chronic combined systolic (congestive) and diastolic (congestive) heart failure: Secondary | ICD-10-CM | POA: Diagnosis present

## 2024-07-05 DIAGNOSIS — I509 Heart failure, unspecified: Principal | ICD-10-CM

## 2024-07-05 LAB — BASIC METABOLIC PANEL WITH GFR
Anion gap: 9 (ref 5–15)
Anion gap: 9 (ref 5–15)
BUN: 21 mg/dL — ABNORMAL HIGH (ref 6–20)
BUN: 25 mg/dL — ABNORMAL HIGH (ref 6–20)
CO2: 23 mmol/L (ref 22–32)
CO2: 26 mmol/L (ref 22–32)
Calcium: 9.5 mg/dL (ref 8.9–10.3)
Calcium: 9.5 mg/dL (ref 8.9–10.3)
Chloride: 104 mmol/L (ref 98–111)
Chloride: 103 mmol/L (ref 98–111)
Creatinine, Ser: 1.49 mg/dL — ABNORMAL HIGH (ref 0.61–1.24)
Creatinine, Ser: 1.95 mg/dL — ABNORMAL HIGH (ref 0.61–1.24)
GFR, Estimated: 55 mL/min — ABNORMAL LOW
GFR, Estimated: 40 mL/min — ABNORMAL LOW
Glucose, Bld: 97 mg/dL (ref 70–99)
Glucose, Bld: 114 mg/dL — ABNORMAL HIGH (ref 70–99)
Potassium: 5 mmol/L (ref 3.5–5.1)
Potassium: 4.4 mmol/L (ref 3.5–5.1)
Sodium: 137 mmol/L (ref 135–145)
Sodium: 138 mmol/L (ref 135–145)

## 2024-07-05 LAB — CBC
HCT: 38.8 % — ABNORMAL LOW (ref 39.0–52.0)
Hemoglobin: 11.8 g/dL — ABNORMAL LOW (ref 13.0–17.0)
MCH: 24.6 pg — ABNORMAL LOW (ref 26.0–34.0)
MCHC: 30.4 g/dL (ref 30.0–36.0)
MCV: 80.8 fL (ref 80.0–100.0)
Platelets: 304 K/uL (ref 150–400)
RBC: 4.8 MIL/uL (ref 4.22–5.81)
RDW: 18.2 % — ABNORMAL HIGH (ref 11.5–15.5)
WBC: 6.7 K/uL (ref 4.0–10.5)
nRBC: 0 % (ref 0.0–0.2)

## 2024-07-05 LAB — TROPONIN T, HIGH SENSITIVITY
Troponin T High Sensitivity: 492 ng/L (ref 0–19)
Troponin T High Sensitivity: 458 ng/L (ref 0–19)

## 2024-07-05 LAB — PRO BRAIN NATRIURETIC PEPTIDE: Pro Brain Natriuretic Peptide: 8667 pg/mL — ABNORMAL HIGH

## 2024-07-05 MED ORDER — BISACODYL 5 MG PO TBEC: 5.0000 mg | DELAYED_RELEASE_TABLET | Freq: Every day | ORAL | Status: DC | PRN

## 2024-07-05 MED ORDER — ORAL CARE MOUTH RINSE: 15.0000 mL | OROMUCOSAL | Status: DC | PRN

## 2024-07-05 MED ORDER — CARVEDILOL 3.125 MG PO TABS: 6.2500 mg | ORAL_TABLET | Freq: Two times a day (BID) | ORAL | Status: DC

## 2024-07-05 MED ORDER — SPIRONOLACTONE 12.5 MG HALF TABLET
12.5000 mg | ORAL_TABLET | Freq: Every day | ORAL | Status: DC
  Administered 2024-07-06 – 2024-07-08 (×3): 12.5 mg via ORAL
  Filled 2024-07-05 (×3): qty 1

## 2024-07-05 MED ORDER — LOSARTAN POTASSIUM 50 MG PO TABS: 50.0000 mg | ORAL_TABLET | Freq: Every day | ORAL | Status: DC

## 2024-07-05 MED ORDER — ACETAMINOPHEN 500 MG PO TABS
1000.0000 mg | ORAL_TABLET | Freq: Four times a day (QID) | ORAL | Status: DC | PRN
  Administered 2024-07-08: 1000 mg via ORAL
  Filled 2024-07-05: qty 2

## 2024-07-05 MED ORDER — FUROSEMIDE 10 MG/ML IJ SOLN
60.0000 mg | Freq: Once | INTRAMUSCULAR | Status: AC
  Administered 2024-07-05: 60 mg via INTRAVENOUS
  Filled 2024-07-05: qty 8

## 2024-07-05 MED ORDER — ATORVASTATIN CALCIUM 40 MG PO TABS
40.0000 mg | ORAL_TABLET | Freq: Every day | ORAL | Status: DC
  Administered 2024-07-05 – 2024-07-08 (×4): 40 mg via ORAL
  Filled 2024-07-05 (×4): qty 1

## 2024-07-05 MED ORDER — ISOSORB DINITRATE-HYDRALAZINE 20-37.5 MG PO TABS
1.0000 | ORAL_TABLET | Freq: Three times a day (TID) | ORAL | Status: DC
  Administered 2024-07-05 – 2024-07-08 (×9): 1 via ORAL
  Filled 2024-07-05 (×10): qty 1

## 2024-07-05 MED ORDER — FUROSEMIDE 10 MG/ML IJ SOLN
40.0000 mg | Freq: Two times a day (BID) | INTRAMUSCULAR | Status: DC
  Administered 2024-07-05 – 2024-07-07 (×4): 40 mg via INTRAVENOUS
  Filled 2024-07-05 (×4): qty 4

## 2024-07-05 MED ORDER — SPIRONOLACTONE 25 MG PO TABS: 25.0000 mg | ORAL_TABLET | Freq: Every day | ORAL | Status: DC

## 2024-07-05 MED ORDER — HYDRALAZINE HCL 25 MG PO TABS: 50.0000 mg | ORAL_TABLET | Freq: Three times a day (TID) | ORAL | Status: DC

## 2024-07-05 MED ORDER — LOSARTAN POTASSIUM 50 MG PO TABS
50.0000 mg | ORAL_TABLET | Freq: Once | ORAL | Status: AC
  Administered 2024-07-05: 50 mg via ORAL
  Filled 2024-07-05: qty 1

## 2024-07-05 MED ORDER — ASPIRIN 81 MG PO TBEC
81.0000 mg | DELAYED_RELEASE_TABLET | Freq: Every day | ORAL | Status: DC
  Administered 2024-07-05 – 2024-07-08 (×4): 81 mg via ORAL
  Filled 2024-07-05 (×4): qty 1

## 2024-07-05 MED ORDER — DOCUSATE SODIUM 100 MG PO CAPS
100.0000 mg | ORAL_CAPSULE | Freq: Two times a day (BID) | ORAL | Status: DC
  Administered 2024-07-05 – 2024-07-08 (×5): 100 mg via ORAL
  Filled 2024-07-05 (×7): qty 1

## 2024-07-05 MED ORDER — ONDANSETRON HCL 4 MG PO TABS: 4.0000 mg | ORAL_TABLET | Freq: Four times a day (QID) | ORAL | Status: DC | PRN

## 2024-07-05 MED ORDER — POLYETHYLENE GLYCOL 3350 17 G PO PACK: 17.0000 g | PACK | Freq: Every day | ORAL | Status: DC | PRN

## 2024-07-05 MED ORDER — HYDRALAZINE HCL 25 MG PO TABS
50.0000 mg | ORAL_TABLET | Freq: Once | ORAL | Status: AC
  Administered 2024-07-05: 50 mg via ORAL
  Filled 2024-07-05: qty 2

## 2024-07-05 MED ORDER — ENOXAPARIN SODIUM 40 MG/0.4ML IJ SOSY
40.0000 mg | PREFILLED_SYRINGE | INTRAMUSCULAR | Status: DC
  Filled 2024-07-05 (×2): qty 0.4

## 2024-07-05 MED ORDER — HYDRALAZINE HCL 20 MG/ML IJ SOLN
5.0000 mg | INTRAMUSCULAR | Status: DC | PRN
  Administered 2024-07-05: 5 mg via INTRAVENOUS
  Filled 2024-07-05: qty 1

## 2024-07-05 MED ORDER — ONDANSETRON HCL 4 MG/2ML IJ SOLN: 4.0000 mg | Freq: Four times a day (QID) | INTRAMUSCULAR | Status: DC | PRN

## 2024-07-05 MED ORDER — CARVEDILOL 3.125 MG PO TABS
6.2500 mg | ORAL_TABLET | Freq: Once | ORAL | Status: AC
  Administered 2024-07-05: 6.25 mg via ORAL
  Filled 2024-07-05: qty 2

## 2024-07-05 MED ORDER — ISOSORBIDE MONONITRATE ER 30 MG PO TB24: 30.0000 mg | ORAL_TABLET | Freq: Every day | ORAL | Status: DC

## 2024-07-05 MED ORDER — SODIUM CHLORIDE 0.9% FLUSH
3.0000 mL | Freq: Two times a day (BID) | INTRAVENOUS | Status: DC
  Administered 2024-07-05 – 2024-07-08 (×6): 3 mL via INTRAVENOUS

## 2024-07-05 NOTE — Significant Event (Signed)
 Rapid Response Event Note   Reason for Call :  HR 39  Initial Focused Assessment:  Patient alert and oriented x 4, complains of leg, hip, and groin cramping. He is eating mustard packets to help resolve cramps. Patient denied chest pain or shortness of breath. Patient does not appear anxious or distressed. Patient did not quantify pain with numerical score. VS:  BP 119/74, HR 48, RR 22.  Interventions:  EKG done: Sinus Bradycardia Assessment as above.  BMET.  Plan of Care:  Follow up on lab values. Call for any change in condition.   Event Summary:   MD Notified: Lynwood Kipper, NP Call Time: 2040 Arrival Time: 2041 End Time: 2115  Alan LITTIE Portugal, RN

## 2024-07-05 NOTE — Assessment & Plan Note (Signed)
 Reports only taking amlodipine  because that is the only medication he has Carvedilol  resumed along with hydralazine , losartan , and Imdur  Will also add prn hydralazine 

## 2024-07-05 NOTE — ED Triage Notes (Signed)
 Pt presents from home with c/o of diffculty taking a deep breath, symp. Started this morning once he got outside. Pt states symptoms worsen when walking. Hx HF

## 2024-07-05 NOTE — ED Provider Notes (Signed)
 " [place] EMERGENCY DEPARTMENT AT [facility] Provider Note   CSN: [id number] Arrival date & time: 07/05/24  9093     Patient presents with: Shortness of Breath   Troy Singh. is a 53 y.o. male.   53 year old male with past medical history of hypertension, hyperlipidemia, and CHF presenting to the emergency department today with shortness of breath.  Patient states that he has been having some leg swelling that over the past few days.  Reports that he has been taking his medications as prescribed but did not take his medications this morning including his blood pressure medications.  He states that this morning he was on the way to work when he was becoming short of breath.  He came to the ER at that time for further evaluation.  He states that he has had some gradual leg swelling over the past few days.  He denies any chest pain.   Shortness of Breath      Prior to Admission medications  Medication Sig Start Date End Date Taking? Authorizing Provider  acetaminophen  (TYLENOL ) 500 MG tablet Take 1,000 mg by mouth every 6 (six) hours as needed for mild pain.    [provider]  allopurinol  (ZYLOPRIM ) 100 MG tablet Take 1 tablet (100 mg total) by mouth daily. Patient not taking: Reported on 05/30/2023 06/29/22 07/04/23  Gonfa, Taye T, MD  amLODipine  (NORVASC ) 5 MG tablet Take 1 tablet (5 mg total) by mouth daily. 06/16/24 07/17/24  Minnie Tinnie BRAVO, PA  aspirin  EC 81 MG tablet Take 1 tablet (81 mg total) by mouth daily. Swallow whole. Patient not taking: Reported on 05/30/2023 06/26/22   Gonfa, Taye T, MD  atorvastatin  (LIPITOR) 40 MG tablet Take 1 tablet (40 mg total) by mouth daily. Patient not taking: Reported on 05/30/2023 07/16/22   Colletta Manuelita Garre, PA-C  carvedilol  (COREG ) 6.25 MG tablet Take 1 tablet (6.25 mg total) by mouth 2 (two) times daily with a meal. Patient not taking: Reported on 05/30/2023 07/30/22   Sabharwal, Aditya, DO  furosemide  (LASIX )  40 MG tablet Take 1 tablet (40 mg total) by mouth daily. Patient not taking: Reported on 05/30/2023 07/16/22   Colletta Manuelita Garre, PA-C  hydrALAZINE  (APRESOLINE ) 50 MG tablet Take 1 tablet (50 mg total) by mouth every 8 (eight) hours. Patient not taking: Reported on 05/30/2023 07/16/22   Colletta Manuelita Garre, PA-C  indomethacin  (INDOCIN ) 25 MG capsule Take 1 capsule (25 mg total) by mouth 3 (three) times daily as needed. 06/16/24   Minnie Tinnie BRAVO, PA  isosorbide  mononitrate (IMDUR ) 30 MG 24 hr tablet Take 1 tablet (30 mg total) by mouth daily. Patient not taking: Reported on 05/30/2023 07/16/22   Colletta Manuelita Garre, PA-C  losartan  (COZAAR ) 50 MG tablet Take 1 tablet (50 mg total) by mouth daily. 06/01/23   Dorinda Drue DASEN, MD  metFORMIN  (GLUCOPHAGE -XR) 500 MG 24 hr tablet Take 1 tablet (500 mg total) by mouth daily with breakfast. Patient not taking: Reported on 05/30/2023 06/28/22 09/26/22  Jaycee Greig PARAS, NP  predniSONE  (DELTASONE ) 10 MG tablet Take 6 tabs by mouth daily for 2 days, then 5 tabs for 2 days, then 4 tabs for 2 days, then 3 tabs for 2 days, 2 tabs for 2 days, then 1 tab by mouth daily for 2 days 06/16/24   Minnie Tinnie BRAVO, PA  spironolactone  (ALDACTONE ) 25 MG tablet Take 1 tablet (25 mg total) by mouth daily. Patient not taking: Reported on 05/30/2023 07/22/22 08/03/23  Colletta Manuelita Garre, PA-C    Allergies: Patient has no known allergies.    Review of Systems  Respiratory:  Positive for shortness of breath.   All other systems reviewed and are negative.   Updated Vital Signs BP (!) 159/97 (BP Location: Right Arm)   Pulse (!) 53   Temp 98.4 F (36.9 C) (Oral)   Resp 16   Ht 5' 3 (1.6 m)   Wt 87.5 kg   SpO2 97%   BMI 34.19 kg/m   Physical Exam Vitals and nursing note reviewed.   Gen: NAD Eyes: PERRL, EOMI HEENT: no oropharyngeal swelling Neck: trachea midline Resp: Rales at bilateral lung bases, mild conversational dyspnea noted Card: RRR, no murmurs, rubs, or  gallops Abd: nontender, nondistended Extremities: no calf tenderness, no edema Vascular: 2+ radial pulses bilaterally, 2+ DP pulses bilaterally Skin: no rashes Psyc: acting appropriately   (all labs ordered are listed, but only abnormal results are displayed) Labs Reviewed  CBC - Abnormal; Notable for the following components:      Result Value   Hemoglobin 11.8 (*)    HCT 38.8 (*)    MCH 24.6 (*)    RDW 18.2 (*)    All other components within normal limits  TROPONIN T, HIGH SENSITIVITY - Abnormal; Notable for the following components:   Troponin T High Sensitivity 458 (*)    All other components within normal limits  BASIC METABOLIC PANEL WITH GFR  TROPONIN T, HIGH SENSITIVITY    EKG: EKG Interpretation Date/Time:  Monday July 05 2024 09:25:04 EST Ventricular Rate:  69 PR Interval:  210 QRS Duration:  140 QT Interval:  443 QTC Calculation: 475 R Axis:   -81  Text Interpretation: Sinus rhythm Ventricular premature complex Prolonged PR interval Probable left atrial enlargement Nonspecific IVCD with LAD Left ventricular hypertrophy Confirmed by Ula Barter [phone]) on 07/05/2024 9:48:20 AM  Radiology: [facility] Chest 2 View Result Date: 07/05/2024 CLINICAL DATA:  Shortness of breath.  Congestive heart failure. EXAM: CHEST - 2 VIEW COMPARISON:  05/29/2023 FINDINGS: Stable mild cardiomegaly. Central pulmonary vascular congestion is again noted, and mild interstitial prominence seen predominantly in the lower lungs zones is suspicious for mild interstitial edema. No evidence of pulmonary consolidation or pleural effusion. IMPRESSION: Cardiomegaly and pulmonary vascular congestion, with probable mild interstitial edema. Electronically Signed   By: Norleen DELENA Kil M.D.   On: 07/05/2024 10:24     Procedures   Medications Ordered in the ED  hydrALAZINE  (APRESOLINE ) tablet 50 mg (50 mg Oral Given 07/05/24 1009)  losartan  (COZAAR ) tablet 50 mg (50 mg Oral Given 07/05/24 1009)   carvedilol  (COREG ) tablet 6.25 mg (6.25 mg Oral Given 07/05/24 1009)  furosemide  (LASIX ) injection 60 mg (60 mg Intravenous Given 07/05/24 1008)                                    Medical Decision Making 53 year old male with past medical history of CHF, hypertension, and hyperlipidemia presenting to the emergency department today with shortness of breath.  Patient does have exam consistent with CHF exacerbation.  His blood pressure is elevated here on arrival which I suspect is partly since he has not taken his morning blood pressure medications.  I will order these for the patient to give him 60 mg of Lasix .  He is borderline hypoxic in the room.  Chest x-ray interpreted by me does show some pulmonary edema.  I  suspect he will likely require admission but will give him some time after the Lasix  to see if his symptoms improve and if his blood pressure improves.  He may potentially be able to go home but will likely require admission.  The patient's troponin did come back elevated.  He is not having any chest pain.  Discussed with Dr. Delford.  Recommends keeping the patient here at [facility] long and to treat his CHF and blood pressure.  Calls placed to hospital service for admission.  Amount and/or Complexity of Data Reviewed Labs: ordered. Radiology: ordered.  Risk Prescription drug management.        Final diagnoses:  Acute on chronic congestive heart failure, unspecified heart failure type (HCC)  Elevated troponin  Hypertensive urgency    ED Discharge Orders     None          Ula Prentice SAUNDERS, MD 07/05/24 1436  "

## 2024-07-05 NOTE — Assessment & Plan Note (Signed)
 No longer taking allopurinol 

## 2024-07-05 NOTE — ED Notes (Signed)
 Pt placed on telemetry.

## 2024-07-05 NOTE — H&P (Signed)
 " History and Physical    Patient: Troy Singh. FMW:[id number] DOB: 10-07-1970 DOA: 07/05/2024 DOS: the patient was seen and examined on 07/05/2024 PCP: Jaycee Greig PARAS, NP  Patient coming from: Home - lives with sister; NOK: Tandre, [place] [phone]   Chief Complaint: LE edema and SOB  HPI: Troy Singh. is a 53 y.o. male with medical history significant of HTN, HLD, and chronic combined CHF who presented on 12/22 with LE edema and SOB.  He reports that he was feeling some chest pressure, feels that periodically and it wasn't going away.  +SOB, unable to take a deep breath.  No cough.  +LE edema.  He is only currently taking amlodipine , has not taken others within the last 6 months or so because he ran out.  He does not check his BP at home.  Symptoms are more with exertion, ok while at rest.  He needed O2 on arrival but this has been removed.   ER Course:  Likely CHF exacerbation with edema and SOB.  Troponin 490.  Dr. Delford looked at last cath, unlikely ACS.  Cardiology will consult.  Restarted home meds, didn't take home meds.  Given lasix  60 mg IV.     Review of Systems: As mentioned in the history of present illness. All other systems reviewed and are negative. Past Medical History:  Diagnosis Date   Diabetes mellitus without complication (HCC)    Gout    Hypertension    Past Surgical History:  Procedure Laterality Date   PENECTOMY     RIGHT/LEFT HEART CATH AND CORONARY ANGIOGRAPHY N/A 06/21/2022   Procedure: RIGHT/LEFT HEART CATH AND CORONARY ANGIOGRAPHY;  Surgeon: Verlin Lonni BIRCH, MD;  Location: [facility] INVASIVE CV LAB;  Service: Cardiovascular;  Laterality: N/A;   Social History:  reports that he quit smoking about 3 years ago. His smoking use included cigarettes. He smoked an average of 1 pack per day. He has been exposed to tobacco smoke. He has never used smokeless tobacco. He reports that he does not drink alcohol and does not use  drugs.  Allergies[1]  Family History  Problem Relation Age of Onset   Hypertrophic cardiomyopathy Mother    Diabetes Other    Hypertension Other     Prior to Admission medications  Medication Sig Start Date End Date Taking? Authorizing Provider  acetaminophen  (TYLENOL ) 500 MG tablet Take 1,000 mg by mouth every 6 (six) hours as needed for mild pain.    [provider]  allopurinol  (ZYLOPRIM ) 100 MG tablet Take 1 tablet (100 mg total) by mouth daily. Patient not taking: Reported on 05/30/2023 06/29/22 07/04/23  Kathrin Mignon DASEN, MD  amLODipine  (NORVASC ) 5 MG tablet Take 1 tablet (5 mg total) by mouth daily. 06/16/24 07/17/24  Minnie Tinnie BRAVO, PA  aspirin  EC 81 MG tablet Take 1 tablet (81 mg total) by mouth daily. Swallow whole. Patient not taking: Reported on 05/30/2023 06/26/22   Gonfa, Taye T, MD  atorvastatin  (LIPITOR) 40 MG tablet Take 1 tablet (40 mg total) by mouth daily. Patient not taking: Reported on 05/30/2023 07/16/22   Colletta Manuelita Garre, PA-C  carvedilol  (COREG ) 6.25 MG tablet Take 1 tablet (6.25 mg total) by mouth 2 (two) times daily with a meal. Patient not taking: Reported on 05/30/2023 07/30/22   Sabharwal, Aditya, DO  furosemide  (LASIX ) 40 MG tablet Take 1 tablet (40 mg total) by mouth daily. Patient not taking: Reported on 05/30/2023 07/16/22   Colletta Manuelita Garre, PA-C  hydrALAZINE  (APRESOLINE )  50 MG tablet Take 1 tablet (50 mg total) by mouth every 8 (eight) hours. Patient not taking: Reported on 05/30/2023 07/16/22   Colletta Manuelita Garre, PA-C  indomethacin  (INDOCIN ) 25 MG capsule Take 1 capsule (25 mg total) by mouth 3 (three) times daily as needed. 06/16/24   Minnie Tinnie BRAVO, PA  isosorbide  mononitrate (IMDUR ) 30 MG 24 hr tablet Take 1 tablet (30 mg total) by mouth daily. Patient not taking: Reported on 05/30/2023 07/16/22   Colletta Manuelita Garre, PA-C  losartan  (COZAAR ) 50 MG tablet Take 1 tablet (50 mg total) by mouth daily. 06/01/23   Dorinda Drue DASEN, MD   metFORMIN  (GLUCOPHAGE -XR) 500 MG 24 hr tablet Take 1 tablet (500 mg total) by mouth daily with breakfast. Patient not taking: Reported on 05/30/2023 06/28/22 09/26/22  Jaycee Greig PARAS, NP  predniSONE  (DELTASONE ) 10 MG tablet Take 6 tabs by mouth daily for 2 days, then 5 tabs for 2 days, then 4 tabs for 2 days, then 3 tabs for 2 days, 2 tabs for 2 days, then 1 tab by mouth daily for 2 days 06/16/24   Minnie Tinnie BRAVO, PA  spironolactone  (ALDACTONE ) 25 MG tablet Take 1 tablet (25 mg total) by mouth daily. Patient not taking: Reported on 05/30/2023 07/22/22 08/03/23  Colletta Manuelita Garre, PA-C    Physical Exam: Vitals:   07/05/24 1009 07/05/24 1030 07/05/24 1305 07/05/24 1306  BP: (!) 191/108 (!) 166/127  (!) 159/97  Pulse: 66 70  (!) 53  Resp:  16  16  Temp:   98.4 F (36.9 C)   TempSrc:   Oral   SpO2:  99%  97%  Weight:      Height:       General:  Appears calm and comfortable and is in NAD, on RA Eyes:  PERRL, EOMI, normal lids, iris ENT:  grossly normal hearing, lips & tongue, mmm Neck:  no LAD, masses or thyromegaly; no JVD Cardiovascular:  RRR, no m/r/g. 1+LE edema.  Respiratory:   Scant bibasilar crackles.  Normal respiratory effort. Abdomen:  soft, NT, ND Skin:  no rash or induration seen on limited exam Musculoskeletal:  grossly normal tone BUE/BLE, good ROM, no bony abnormality Psychiatric:  grossly normal mood and affect, speech fluent and appropriate, AOx3 Neurologic:  CN 2-12 grossly intact, moves all extremities in coordinated fashion   Radiological Exams on Admission: Independently reviewed - see discussion in A/P where applicable  DG Chest 2 View Result Date: 07/05/2024 CLINICAL DATA:  Shortness of breath.  Congestive heart failure. EXAM: CHEST - 2 VIEW COMPARISON:  05/29/2023 FINDINGS: Stable mild cardiomegaly. Central pulmonary vascular congestion is again noted, and mild interstitial prominence seen predominantly in the lower lungs zones is suspicious for mild  interstitial edema. No evidence of pulmonary consolidation or pleural effusion. IMPRESSION: Cardiomegaly and pulmonary vascular congestion, with probable mild interstitial edema. Electronically Signed   By: Norleen DELENA Kil M.D.   On: 07/05/2024 10:24    EKG: Independently reviewed.  NSR with rate 69; PVC, IVCD, nonspecific ST changes with no evidence of acute ischemia   Labs on Admission: I have personally reviewed the available labs and imaging studies at the time of the admission.  Pertinent labs:    BUN 21/Creatinine 1.49/GFR 55, stable HS troponin 492, 458 WBC 6.7 Hgb 11.8   Assessment and Plan: Principal Problem:   Acute on chronic combined systolic (congestive) and diastolic (congestive) heart failure (HCC) Active Problems:   Diet-controlled diabetes mellitus (HCC)   Pure hypercholesterolemia  Gout   Uncontrolled hypertension   Class 1 obesity due to excess calories with body mass index (BMI) of 34.0 to 34.9 in adult    Assessment & Plan Acute on chronic combined systolic (congestive) and diastolic (congestive) heart failure (HCC) Patient with known h/o chronic combined CHF presenting with worsening SOB and edema CXR consistent with mild pulmonary edema Pro-BNP is pending Acute decompensated CHF seems probable as diagnosis in the setting of medical non-compliance Will admit, as per the Emergency HF Mortality Risk Grade.  The patient had pulmonary edema requiring new O2 therapy with O2 sat 87% on presentation Will request echocardiogram Start ASA Carvedilol  and losartan  resumed CHF order set utilized Cardiology is consulting Was given Lasix  60 mg x 1 in ER and will repeat with 40 mg IV BID Resume spironolactone  Continue [place] O2 for now prn Mildly elevated HS troponin is likely related to demand ischemia; doubt ACS based on symptoms Pure hypercholesterolemia Resume Lipitor Check lipids in A< Uncontrolled hypertension Reports only taking amlodipine  because that is the  only medication he has Carvedilol  resumed along with hydralazine , losartan , and Imdur  Will also add prn hydralazine  Diet-controlled diabetes mellitus (HCC) Last A1c was 5.8 Appears to be effectively diet controlled at this time There is no indication to start medication at this time Will recheck A1c and follow with fasting labs for now Carb modified (and heart healthy) diet Gout No longer taking allopurinol  Class 1 obesity due to excess calories with body mass index (BMI) of 34.0 to 34.9 in adult Body mass index is 34.19 kg/m.SABRA  Weight loss should be encouraged Outpatient PCP/bariatric medicine f/u encouraged Significantly low or high BMI is associated with higher medical risk including morbidity and mortality        Advance Care Planning:   Code Status: Full Code - Code status was discussed with the patient and/or family at the time of admission.  The patient would want to receive full resuscitative measures at this time.    Consultants: Cardiology TOC   Procedures: Echocardiogram   Antibiotics: None    DVT Prophylaxis: Lovenox   Family Communication: None present  Severity of Illness: The appropriate patient status for this patient is INPATIENT. Inpatient status is judged to be reasonable and necessary in order to provide the required intensity of service to ensure the patient's safety. The patient's presenting symptoms, physical exam findings, and initial radiographic and laboratory data in the context of their chronic comorbidities is felt to place them at high risk for further clinical deterioration. Furthermore, it is not anticipated that the patient will be medically stable for discharge from the hospital within 2 midnights of admission.   * I certify that at the point of admission it is my clinical judgment that the patient will require inpatient hospital care spanning beyond 2 midnights from the point of admission due to high intensity of service, high risk for  further deterioration and high frequency of surveillance required.*  Author: Delon Herald, MD 07/05/2024 3:42 PM  For on call review [email or web address].      [1] No Known Allergies  "

## 2024-07-05 NOTE — Assessment & Plan Note (Signed)
 Patient with known h/o chronic combined CHF presenting with worsening SOB and edema CXR consistent with mild pulmonary edema Pro-BNP is pending Acute decompensated CHF seems probable as diagnosis in the setting of medical non-compliance Will admit, as per the Emergency HF Mortality Risk Grade.  The patient had pulmonary edema requiring new O2 therapy with O2 sat 87% on presentation Will request echocardiogram Start ASA Carvedilol  and losartan  resumed CHF order set utilized Cardiology is consulting Was given Lasix  60 mg x 1 in ER and will repeat with 40 mg IV BID Resume spironolactone  Continue [place] O2 for now prn Mildly elevated HS troponin is likely related to demand ischemia; doubt ACS based on symptoms

## 2024-07-05 NOTE — Assessment & Plan Note (Signed)
 Last A1c was 5.8 Appears to be effectively diet controlled at this time There is no indication to start medication at this time Will recheck A1c and follow with fasting labs for now Carb modified (and heart healthy) diet

## 2024-07-05 NOTE — Assessment & Plan Note (Signed)
 Body mass index is 34.19 kg/m.Troy Singh  Weight loss should be encouraged Outpatient PCP/bariatric medicine f/u encouraged Significantly low or high BMI is associated with higher medical risk including morbidity and mortality

## 2024-07-05 NOTE — ED Notes (Signed)
 Pt ambulated 100 feet while maintaining O2 saturation above 97% on room air.

## 2024-07-05 NOTE — Assessment & Plan Note (Signed)
 Resume Lipitor Check lipids in A<

## 2024-07-05 NOTE — Consult Note (Signed)
 CARDIOLOGY CONSULT NOTE       Patient ID: Troy Singh. MRN: [id number] DOB/AGE: 1970-07-20 53 y.o.  Admit date: 07/05/2024 Referring Physician: Prentice [facility] Primary Physician: Jaycee Greig PARAS, NP Primary Cardiologist: Wilbert Bihari Reason for Consultation: CHF  Principal Problem:   Acute on chronic combined systolic (congestive) and diastolic (congestive) heart failure (HCC) Active Problems:   Diet-controlled diabetes mellitus (HCC)   Pure hypercholesterolemia   Gout   Uncontrolled hypertension   Class 1 obesity due to excess calories with body mass index (BMI) of 34.0 to 34.9 in adult   HPI:  53 y.o. admitted from ER with CHF exacerbation. Known non ischemic DCM. Cath 06/21/22 with no obstructive CAD. Last echo 05/30/23 EF 25-30% with mild MR.  RV is normal. He has not taken any medication for 6 months due to finances. 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 He tries to watch his diet. He has had increasing exertional dyspnea. PND and fatigue. In ER CXR with CE and pulmonary vascular congestion. Troponin 458 BNP pending. K 5.0 and Cr 1.49.  Not having chest pain ECG  Sinus with PVC IVCD and LVH  He is in no distress  He denies ETOH, drugs or smoking   ROS All other systems reviewed and negative except as noted above  Past Medical History:  Diagnosis Date   Diabetes mellitus without complication (HCC)    Gout    Hypertension     Family History  Problem Relation Age of Onset   Hypertrophic cardiomyopathy Mother    Diabetes Other    Hypertension Other     Social History   Socioeconomic History   Marital status: Single    Spouse name: Not on file   Number of children: Not on file   Years of education: Not on file   Highest education level: Not on file  Occupational History   Not on file  Tobacco Use   Smoking status: Former    Current packs/day: 0.00    Average packs/day: 1.0 packs/day    Types: Cigarettes    Quit date: 05/31/2021    Years  since quitting: 3.0    Passive exposure: Past   Smokeless tobacco: Never  Vaping Use   Vaping status: Never Used  Substance and Sexual Activity   Alcohol use: No    Alcohol/week: 0.0 standard drinks of alcohol   Drug use: No   Sexual activity: Yes  Other Topics Concern   Not on file  Social History Narrative   Not on file   Social Drivers of Health   Tobacco Use: Medium Risk (07/05/2024)   Patient History    Smoking Tobacco Use: Former    Smokeless Tobacco Use: Never    Passive Exposure: Past  Physicist, Medical Strain: Medium Risk (07/16/2022)   Overall Financial Resource Strain (CARDIA)    Difficulty of Paying Living Expenses: Somewhat hard  Food Insecurity: No Food Insecurity (05/30/2023)   Hunger Vital Sign    Worried About Running Out of Food in the Last Year: Never true    Ran Out of Food in the Last Year: Never true  Transportation Needs: No Transportation Needs (05/30/2023)   PRAPARE - Administrator, Civil Service (Medical): No    Lack of Transportation (Non-Medical): No  Physical Activity: Not on file  Stress: Not on file  Social Connections: Not on file  Intimate Partner Violence: Not At Risk (05/30/2023)   Humiliation, Afraid, Rape, and  Kick questionnaire    Fear of Current or Ex-Partner: No    Emotionally Abused: No    Physically Abused: No    Sexually Abused: No  Depression (PHQ2-9): Low Risk (06/28/2022)   Depression (PHQ2-9)    PHQ-2 Score: 0  Alcohol Screen: Not on file  Housing: Low Risk (05/30/2023)   Housing    Last Housing Risk Score: 0  Utilities: Not At Risk (05/30/2023)   AHC Utilities    Threatened with loss of utilities: No  Health Literacy: Not on file    Past Surgical History:  Procedure Laterality Date   PENECTOMY     RIGHT/LEFT HEART CATH AND CORONARY ANGIOGRAPHY N/A 06/21/2022   Procedure: RIGHT/LEFT HEART CATH AND CORONARY ANGIOGRAPHY;  Surgeon: Verlin Lonni BIRCH, MD;  Location: [facility];  Service:  Cardiovascular;  Laterality: N/A;     Current Medications[1]    Physical Exam: Blood pressure (!) 159/97, pulse (!) 53, temperature 98.4 F (36.9 C), temperature source Oral, resp. rate 16, height 5' 3 (1.6 m), weight 87.5 kg, SpO2 97%.    Black male Basilar crakles No murmur Abdomen benign Plus one edema JVP elevated   Labs:   Lab Results  Component Value Date   WBC 6.7 07/05/2024   HGB 11.8 (L) 07/05/2024   HCT 38.8 (L) 07/05/2024   MCV 80.8 07/05/2024   PLT 304 07/05/2024    Recent Labs  Lab 07/05/24 1006  NA 137  K 5.0  CL 104  CO2 23  BUN 21*  CREATININE 1.49*  CALCIUM  9.5  GLUCOSE 97   Lab Results  Component Value Date   TROPONINI 0.09 (H) 11/14/2014    Lab Results  Component Value Date   CHOL 171 06/22/2022   Lab Results  Component Value Date   HDL 31 (L) 06/22/2022   Lab Results  Component Value Date   LDLCALC 128 (H) 06/22/2022   Lab Results  Component Value Date   TRIG 62 06/22/2022   Lab Results  Component Value Date   CHOLHDL 5.5 06/22/2022   No results found for: LDLDIRECT    Radiology: DG Chest 2 View Result Date: 07/05/2024 CLINICAL DATA:  Shortness of breath.  Congestive heart failure. EXAM: CHEST - 2 VIEW COMPARISON:  05/29/2023 FINDINGS: Stable mild cardiomegaly. Central pulmonary vascular congestion is again noted, and mild interstitial prominence seen predominantly in the lower lungs zones is suspicious for mild interstitial edema. No evidence of pulmonary consolidation or pleural effusion. IMPRESSION: Cardiomegaly and pulmonary vascular congestion, with probable mild interstitial edema. Electronically Signed   By: Norleen DELENA Kil M.D.   On: 07/05/2024 10:24    EKG: See HPO   ASSESSMENT AND PLAN:   CHF:  known non ischemic DCM> Complicated by HTN and mild renal failure. K/Cr up a bit. For now would start bidil  tid as this is an inexpensive med at [facility] and use lasix  40 mg iv bid. His HR is low and with fatigue may have  some low output. So no beta blocker. Can consider adding low dose ARB if Cr stays stable with diuresis Elevated Troponin:  no evidence of ischemia on ECG no chest pain. CAth in 06/21/22 with no obstructive CAD likely elevated from CHF  Will follow with you Signed: Maude Emmer 07/05/2024, 3:39 PM      [1] No current facility-administered medications for this encounter.  Current Outpatient Medications:    acetaminophen  (TYLENOL ) 500 MG tablet, Take 1,000 mg by mouth every 6 (six) hours as needed  for mild pain., Disp: , Rfl:    allopurinol  (ZYLOPRIM ) 100 MG tablet, Take 1 tablet (100 mg total) by mouth daily. (Patient not taking: Reported on 05/30/2023), Disp: 90 tablet, Rfl: 1   amLODipine  (NORVASC ) 5 MG tablet, Take 1 tablet (5 mg total) by mouth daily., Disp: 30 tablet, Rfl: 0   aspirin  EC 81 MG tablet, Take 1 tablet (81 mg total) by mouth daily. Swallow whole. (Patient not taking: Reported on 05/30/2023), Disp: 30 tablet, Rfl: 12   atorvastatin  (LIPITOR) 40 MG tablet, Take 1 tablet (40 mg total) by mouth daily. (Patient not taking: Reported on 05/30/2023), Disp: 90 tablet, Rfl: 3   carvedilol  (COREG ) 6.25 MG tablet, Take 1 tablet (6.25 mg total) by mouth 2 (two) times daily with a meal. (Patient not taking: Reported on 05/30/2023), Disp: 45 tablet, Rfl: 3   furosemide  (LASIX ) 40 MG tablet, Take 1 tablet (40 mg total) by mouth daily. (Patient not taking: Reported on 05/30/2023), Disp: 90 tablet, Rfl: 3   hydrALAZINE  (APRESOLINE ) 50 MG tablet, Take 1 tablet (50 mg total) by mouth every 8 (eight) hours. (Patient not taking: Reported on 05/30/2023), Disp: 90 tablet, Rfl: 11   indomethacin  (INDOCIN ) 25 MG capsule, Take 1 capsule (25 mg total) by mouth 3 (three) times daily as needed., Disp: 30 capsule, Rfl: 0   isosorbide  mononitrate (IMDUR ) 30 MG 24 hr tablet, Take 1 tablet (30 mg total) by mouth daily. (Patient not taking: Reported on 05/30/2023), Disp: 90 tablet, Rfl: 3   losartan  (COZAAR ) 50  MG tablet, Take 1 tablet (50 mg total) by mouth daily., Disp: 30 tablet, Rfl: 3   metFORMIN  (GLUCOPHAGE -XR) 500 MG 24 hr tablet, Take 1 tablet (500 mg total) by mouth daily with breakfast. (Patient not taking: Reported on 05/30/2023), Disp: 30 tablet, Rfl: 2   predniSONE  (DELTASONE ) 10 MG tablet, Take 6 tabs by mouth daily for 2 days, then 5 tabs for 2 days, then 4 tabs for 2 days, then 3 tabs for 2 days, 2 tabs for 2 days, then 1 tab by mouth daily for 2 days, Disp: 42 tablet, Rfl: 0   spironolactone  (ALDACTONE ) 25 MG tablet, Take 1 tablet (25 mg total) by mouth daily. (Patient not taking: Reported on 05/30/2023), Disp: 90 tablet, Rfl: 0

## 2024-07-06 ENCOUNTER — Other Ambulatory Visit (HOSPITAL_COMMUNITY): Payer: Self-pay

## 2024-07-06 ENCOUNTER — Inpatient Hospital Stay (HOSPITAL_COMMUNITY): Payer: Self-pay

## 2024-07-06 DIAGNOSIS — I5021 Acute systolic (congestive) heart failure: Secondary | ICD-10-CM

## 2024-07-06 LAB — BASIC METABOLIC PANEL WITH GFR
Anion gap: 14 (ref 5–15)
BUN: 24 mg/dL — ABNORMAL HIGH (ref 6–20)
CO2: 23 mmol/L (ref 22–32)
Calcium: 9.7 mg/dL (ref 8.9–10.3)
Chloride: 101 mmol/L (ref 98–111)
Creatinine, Ser: 1.7 mg/dL — ABNORMAL HIGH (ref 0.61–1.24)
GFR, Estimated: 47 mL/min — ABNORMAL LOW
Glucose, Bld: 77 mg/dL (ref 70–99)
Potassium: 4.6 mmol/L (ref 3.5–5.1)
Sodium: 138 mmol/L (ref 135–145)

## 2024-07-06 LAB — LIPID PANEL
Cholesterol: 152 mg/dL (ref 0–200)
HDL: 36 mg/dL — ABNORMAL LOW
LDL Cholesterol: 100 mg/dL — ABNORMAL HIGH (ref 0–99)
Total CHOL/HDL Ratio: 4.2 ratio
Triglycerides: 78 mg/dL
VLDL: 16 mg/dL (ref 0–40)

## 2024-07-06 LAB — ECHOCARDIOGRAM COMPLETE
Area-P 1/2: 2.93 cm2
Calc EF: 43.9 %
Height: 63 in
S' Lateral: 5.2 cm
Single Plane A2C EF: 51.4 %
Single Plane A4C EF: 33.8 %
Weight: 3000 [oz_av]

## 2024-07-06 LAB — CBC
HCT: 36.1 % — ABNORMAL LOW (ref 39.0–52.0)
Hemoglobin: 11.2 g/dL — ABNORMAL LOW (ref 13.0–17.0)
MCH: 24 pg — ABNORMAL LOW (ref 26.0–34.0)
MCHC: 31 g/dL (ref 30.0–36.0)
MCV: 77.5 fL — ABNORMAL LOW (ref 80.0–100.0)
Platelets: 278 K/uL (ref 150–400)
RBC: 4.66 MIL/uL (ref 4.22–5.81)
RDW: 18.4 % — ABNORMAL HIGH (ref 11.5–15.5)
WBC: 6.1 K/uL (ref 4.0–10.5)
nRBC: 0 % (ref 0.0–0.2)

## 2024-07-06 LAB — HEMOGLOBIN A1C
Hgb A1c MFr Bld: 6.2 % — ABNORMAL HIGH (ref 4.8–5.6)
Mean Plasma Glucose: 131.24 mg/dL

## 2024-07-06 LAB — HIV ANTIBODY (ROUTINE TESTING W REFLEX): HIV Screen 4th Generation wRfx: NONREACTIVE

## 2024-07-06 MED ORDER — LOSARTAN POTASSIUM 50 MG PO TABS
50.0000 mg | ORAL_TABLET | Freq: Every day | ORAL | Status: DC
  Administered 2024-07-06 – 2024-07-08 (×3): 50 mg via ORAL
  Filled 2024-07-06 (×3): qty 1

## 2024-07-06 NOTE — Progress Notes (Signed)
 "          PROGRESS NOTE  Troy Singh. FMW:[id number] DOB: [birth year] DOA: 07/05/2024 PCP: Jaycee Greig PARAS, NP  Brief History:  53 y.o. male with medical history significant of HTN, HLD, and chronic combined CHF who presented on 12/22 with LE edema and SOB.  He reports that he was feeling some chest pressure, feels that periodically and it wasn't going away.  +SOB, unable to take a deep breath.  No cough.  +LE edema.  He is only currently taking amlodipine , has not taken others within the last 6 months or so because he ran out.  He does not check his BP at home.  Symptoms are more with exertion, ok while at rest.  He needed O2 on arrival but this has been removed.     ER Course:  Likely CHF exacerbation with edema and SOB.  Troponin 490.  Dr. Delford looked at last cath, unlikely ACS.  Cardiology will consult.  Restarted home meds, didn't take home meds.  Given lasix  60 mg IV.  Subsequently started on lasix  40 mg IV bid with good clinical improvement.  Repeat Echo shows EF 25-30%, G2DD, normal RVF.  Cardiology was consulted to assist.   Assessment/Plan:  Acute on chronic HFrEF Patient with known h/o chronic combined CHF presenting with worsening SOB and edema CXR consistent with mild pulmonary edema Pro-BNP 8667 -setting of medical non-compliance -patient had pulmonary edema requiring new O2 therapy with O2 sat 87% on presentation -12/23 Echo EF 25-30%, G2DD, normal RVF, trivial MR -Start ASA -losartan  resumed -Cardiology is consult appreciated -continue lasix  40 mg IV bid -Resume spironolactone  Continue [place] O2 for now prn>>weaned to RA Mildly elevated HS troponin is likely related to demand ischemia; doubt ACS based on symptoms -NEG 4.6L -NEG 6 lb  Pure hypercholesterolemia -Resume Lipitor (was not taking PTA) -LDL 100  Uncontrolled hypertension -Reports only taking amlodipine  because that is the only medication he has -d/c amlodipine  in setting of suppressed EF -continue  BiDil , spiro, losartan   Diet-controlled diabetes mellitus (HCC)- -12/23 A1C--6.2 -effectively diet controlled at this time -no indication to start medication at this time Carb modified (and heart healthy) diet  Gout No longer taking allopurinol   Class 1 obesity due to excess calories with body mass index (BMI) of 34.0 to 34.9 in adult Body mass index is 34.19 kg/m.SABRA  Weight loss should be encouraged Outpatient PCP/bariatric medicine f/u encouraged          Family Communication:   Family at bedside  Consultants:    Code Status:  FULL / DNR  DVT Prophylaxis:  [place] Heparin  / [place] Lovenox    Procedures: As Listed in Progress Note Above  Antibiotics: None  RN Pressure Injury Documentation:        Subjective:   Objective: Vitals:   07/05/24 2044 07/06/24 0229 07/06/24 0531 07/06/24 1303  BP: 119/74 (!) 152/87 117/77 114/69  Pulse: (!) 48 62 (!) 59 64  Resp:  16 18 16   Temp:  98.5 F (36.9 C) 98.4 F (36.9 C) 98.4 F (36.9 C)  TempSrc:    Oral  SpO2:  96% 93% 99%  Weight:      Height:        Intake/Output Summary (Last 24 hours) at 07/06/2024 1437 Last data filed at 07/06/2024 1332 Gross per 24 hour  Intake 1080 ml  Output 3750 ml  Net -2670 ml   Weight change:  Exam:  General:  Pt is alert, follows commands appropriately, not  in acute distress HEENT: No icterus, No thrush, No neck mass, [place]/AT Cardiovascular: RRR, S1/S2, no rubs, no gallops Respiratory: CTA bilaterally, no wheezing, no crackles, no rhonchi Abdomen: Soft/+BS, non tender, non distended, no guarding Extremities: No edema, No lymphangitis, No petechiae, No rashes, no synovitis   Data Reviewed: I have personally reviewed following labs and imaging studies Basic Metabolic Panel: Recent Labs  Lab 07/05/24 1006 07/05/24 2111 07/06/24 0518  NA 137 138 138  K 5.0 4.4 4.6  CL 104 103 101  CO2 23 26 23   GLUCOSE 97 114* 77  BUN 21* 25* 24*  CREATININE 1.49* 1.95* 1.70*   CALCIUM  9.5 9.5 9.7   Liver Function Tests: No results for input(s): AST, ALT, ALKPHOS, BILITOT, PROT, ALBUMIN in the last 168 hours. No results for input(s): LIPASE, AMYLASE in the last 168 hours. No results for input(s): AMMONIA in the last 168 hours. Coagulation Profile: No results for input(s): INR, PROTIME in the last 168 hours. CBC: Recent Labs  Lab 07/05/24 1006 07/06/24 0518  WBC 6.7 6.1  HGB 11.8* 11.2*  HCT 38.8* 36.1*  MCV 80.8 77.5*  PLT 304 278   Cardiac Enzymes: No results for input(s): CKTOTAL, CKMB, CKMBINDEX, TROPONINI in the last 168 hours. BNP: Invalid input(s): POCBNP CBG: No results for input(s): GLUCAP in the last 168 hours. HbA1C: Recent Labs    07/06/24 0518  HGBA1C 6.2*   Urine analysis: No results found for: COLORURINE, APPEARANCEUR, LABSPEC, PHURINE, GLUCOSEU, HGBUR, BILIRUBINUR, KETONESUR, PROTEINUR, UROBILINOGEN, NITRITE, LEUKOCYTESUR Sepsis Labs: @LABRCNTIP (procalcitonin:4,lacticidven:4) )No results found for this or any previous visit (from the past 240 hours).   Scheduled Meds:  aspirin  EC  81 mg Oral Daily   atorvastatin   40 mg Oral Daily   docusate sodium   100 mg Oral BID   enoxaparin  (LOVENOX ) injection  40 mg Subcutaneous Q24H   furosemide   40 mg Intravenous BID   isosorbide -hydrALAZINE   1 tablet Oral TID   losartan   50 mg Oral Daily   sodium chloride  flush  3 mL Intravenous Q12H   spironolactone   12.5 mg Oral Daily   Continuous Infusions:  Procedures/Studies: ECHOCARDIOGRAM COMPLETE Result Date: 07/06/2024    ECHOCARDIOGRAM REPORT   Patient Name:   Troy Singh. Date of Exam: 07/06/2024 Medical Rec #:  [id number]         Height:       63.0 in Accession #:    [id number]        Weight:       187.5 lb Date of Birth:  03-Apr-1971         BSA:          1.881 m Patient Age:    53 years          BP:           117/77 mmHg Patient Gender: M                 HR:           99  bpm. Exam Location:  Inpatient Procedure: 2D Echo, Cardiac Doppler and Color Doppler (Both Spectral and Color            Flow Doppler were utilized during procedure). Indications:    CHF-Acute Systolic 428.21 / I50.21  History:        Patient has prior history of Echocardiogram examinations, most                 recent 05/30/2023.  Sonographer:  Tinnie Gosling RDCS Referring Phys: [id number] JENNIFER YATES IMPRESSIONS  1. Left ventricular ejection fraction, by estimation, is 25 to 30%. The left ventricle has severely decreased function. The left ventricle demonstrates global hypokinesis. There is moderate concentric left ventricular hypertrophy. Left ventricular diastolic parameters are consistent with Grade II diastolic dysfunction (pseudonormalization).  2. Right ventricular systolic function is normal. The right ventricular size is normal. Moderately increased right ventricular wall thickness.  3. Left atrial size was severely dilated.  4. Right atrial size was moderately dilated.  5. The mitral valve is normal in structure. Trivial mitral valve regurgitation. No evidence of mitral stenosis.  6. The aortic valve is tricuspid. There is mild calcification of the aortic valve. Aortic valve regurgitation is not visualized. Aortic valve sclerosis/calcification is present, without any evidence of aortic stenosis.  7. The inferior vena cava is dilated in size with <50% respiratory variability, suggesting right atrial pressure of 15 mmHg. Conclusion(s)/Recommendation(s): There is biventricular hypertrophy. Consider cMRI for evalaution of infiltrative process if clinically indicated. FINDINGS  Left Ventricle: Left ventricular ejection fraction, by estimation, is 25 to 30%. The left ventricle has severely decreased function. The left ventricle demonstrates global hypokinesis. The left ventricular internal cavity size was normal in size. There is moderate concentric left ventricular hypertrophy. Left ventricular diastolic  parameters are consistent with Grade II diastolic dysfunction (pseudonormalization). Right Ventricle: The right ventricular size is normal. Moderately increased right ventricular wall thickness. Right ventricular systolic function is normal. Left Atrium: Left atrial size was severely dilated. Right Atrium: Right atrial size was moderately dilated. Pericardium: There is no evidence of pericardial effusion. Mitral Valve: The mitral valve is normal in structure. Trivial mitral valve regurgitation. No evidence of mitral valve stenosis. Tricuspid Valve: The tricuspid valve is normal in structure. Tricuspid valve regurgitation is trivial. No evidence of tricuspid stenosis. Aortic Valve: The aortic valve is tricuspid. There is mild calcification of the aortic valve. Aortic valve regurgitation is not visualized. Aortic valve sclerosis/calcification is present, without any evidence of aortic stenosis. Pulmonic Valve: The pulmonic valve was normal in structure. Pulmonic valve regurgitation is not visualized. No evidence of pulmonic stenosis. Aorta: The aortic root is normal in size and structure. Venous: The inferior vena cava is dilated in size with less than 50% respiratory variability, suggesting right atrial pressure of 15 mmHg. IAS/Shunts: No atrial level shunt detected by color flow Doppler.  LEFT VENTRICLE PLAX 2D LVIDd:         6.60 cm      Diastology LVIDs:         5.20 cm      LV e' medial:    5.44 cm/s LV PW:         1.30 cm      LV E/e' medial:  14.0 LV IVS:        1.30 cm      LV e' lateral:   2.94 cm/s LVOT diam:     2.30 cm      LV E/e' lateral: 26.0 LV SV:         78 LV SV Index:   41 LVOT Area:     4.15 cm  LV Volumes (MOD) LV vol d, MOD A2C: 212.0 ml LV vol d, MOD A4C: 222.0 ml LV vol s, MOD A2C: 103.0 ml LV vol s, MOD A4C: 147.0 ml LV SV MOD A2C:     109.0 ml LV SV MOD A4C:     222.0 ml LV SV MOD BP:  97.0 ml RIGHT VENTRICLE             IVC RV S prime:     12.90 cm/s  IVC diam: 2.20 cm TAPSE (M-mode):  1.4 cm LEFT ATRIUM             Index        RIGHT ATRIUM           Index LA diam:        4.40 cm 2.34 cm/m   RA Area:     17.30 cm LA Vol (A2C):   83.4 ml 44.33 ml/m  RA Volume:   50.40 ml  26.79 ml/m LA Vol (A4C):   85.4 ml 45.39 ml/m LA Biplane Vol: 84.7 ml 45.02 ml/m  AORTIC VALVE LVOT Vmax:   112.00 cm/s LVOT Vmean:  75.000 cm/s LVOT VTI:    0.187 m  AORTA Ao Root diam: 3.30 cm Ao Asc diam:  3.50 cm MITRAL VALVE MV Area (PHT): 2.93 cm    SHUNTS MV E velocity: 76.30 cm/s  Systemic VTI:  0.19 m MV A velocity: 63.00 cm/s  Systemic Diam: 2.30 cm MV E/A ratio:  1.21 Toribio Fuel MD Electronically signed by Toribio Fuel MD Signature Date/Time: 07/06/2024/12:06:01 PM    Final    DG Chest 2 View Result Date: 07/05/2024 CLINICAL DATA:  Shortness of breath.  Congestive heart failure. EXAM: CHEST - 2 VIEW COMPARISON:  05/29/2023 FINDINGS: Stable mild cardiomegaly. Central pulmonary vascular congestion is again noted, and mild interstitial prominence seen predominantly in the lower lungs zones is suspicious for mild interstitial edema. No evidence of pulmonary consolidation or pleural effusion. IMPRESSION: Cardiomegaly and pulmonary vascular congestion, with probable mild interstitial edema. Electronically Signed   By: Norleen DELENA Kil M.D.   On: 07/05/2024 10:24    Alm Schneider, DO  [facility]  If 7PM-7AM, please contact night-coverage [email or web address] Password [facility] 07/06/2024, 2:37 PM   LOS: 1 day   "

## 2024-07-06 NOTE — Progress Notes (Signed)
 Heart Failure Navigator Progress Note  Assessed for Heart & Vascular TOC clinic readiness.  Patient does not meet criteria due to he is an Advanced Heart Failure Team patient. .   Navigator will sign off at this time.   Stephane Haddock, BSN, Scientist, Clinical (histocompatibility And Immunogenetics) Only

## 2024-07-06 NOTE — Progress Notes (Signed)
" °  Echocardiogram 2D Echocardiogram has been performed.  Tinnie FORBES Gosling RDCS 07/06/2024, 10:24 AM "

## 2024-07-06 NOTE — Hospital Course (Addendum)
 The patient is a 53 y.o. male with medical history significant of HTN, HLD, and chronic combined CHF who presented on 12/22 with LE edema and SOB.  He reports that he was feeling some chest pressure, feels that periodically and it wasn't going away.  +SOB, unable to take a deep breath.  No cough.  +LE edema.  He is only currently taking amlodipine , has not taken others within the last 6 months or so because he ran out.  He does not check his BP at home.  Symptoms are more with exertion, ok while at rest.  He needed O2 on arrival but this has been removed. Currently being admitted for acute on chronic combined CHF exacerbation and cardiology has been consulted and he was initiated on IV diuresis with now been transition to p.o. he tolerated this well and cardiology cleared him for discharge he will need to follow-up with them within 1 to 2 weeks as well as following up with PCP given that he is medically stable.   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 Patient with known h/o chronic combined CHF presenting with worsening SOB and edema; CXR consistent with mild pulmonary edema -Pro-BNP 1332 -Patient had pulmonary edema requiring new O2 therapy with O2 sat 87% on presentation -12/23 Echo EF 25-30%, G2DD, normal RVF, trivial MR -Started ASA 81 mg po Daily. C/w Losartan  50 mg po Daily, Isosorbide -Hydralazine  20-37.5 mg per tab 1 tab po TID, and Spironolactone  12.5 mg po Dialy -Cardiology was consulted and he was diuresed with IV Furosemide  and transitioned to po Furosemide  40 mg po Daily today. -Continue [place] O2 for now prn>>weaned to RA -Mildly elevated HS troponin is likely related to demand ischemia; doubt ACS based on symptoms -C/w Heart Healthy/Carb Modified Diet w/ 1500 mL Fluid Restriction.  -Patient is Negative 6.860 Liters since admission and Weight is down 14 lbs since  admission -Cardiology recommended cardiac MRI un the outpatient setting and following up on Amyloid Panel.  Cardiology if his EF does not improve they need to consider ICD for primary prevention especially given brief episodes of NSVT on telemetry and will need routine follow-up.  Currently unable to start GDMT with beta-blocker given his bradycardia -Cardiology feels that we should monitor him overnight and continue diuresis and anticipate discharge on 11/07/2023  Pure Hypercholesterolemia: Lipid Panel Done and showed a total cholesterol/HDL ratio 4.2, cholesterol level 152, HDL 36, LDL 100, triglycerides 78, VLDL 16; C/w Atorvastatin  40 mg po Daily  Uncontrolled HTN: -Reports only taking amlodipine  because that is the only medication he has; D/C Amlodipine  in setting of suppressed EF -continue BiDil , spiro, losartan   AKI on CKD Stage 3a: Baseline Cr is ~1.3-1.6. BUN/Cr Trend improving with Diuresis: Recent Labs  Lab 06/16/24 1724 07/05/24 1006 07/05/24 2111 07/06/24 0518 07/07/24 0514 07/08/24 0513  BUN 31* 21* 25* 24* 30* 28*  CREATININE 1.80* 1.49* 1.95* 1.70* 1.72* 1.59*  -Ideally needs to be on SGLT2 Inhibitor -Avoid Nephrotoxic Medications, Contrast Dyes, Hypotension and Dehydration to Ensure Adequate Renal Perfusion and will need to Renally Adjust Meds. CTM & Trend Renal Function carefully & repeat CMP within 1 week  Non-obstructive CAD: Had a LHC in 2023 which showed mild CAD; C/w ASA 81 mg po Daily   Diet Controlled Diabetes Mellitus Type 2: 12/23 HbA1c was 6.2.  Effectively diet controlled at this time; No indication to start medication at this  time. CTM Blood Sugars per Protocol. C/w Diet as above.  CBG fairly well-controlled on daily BMPs/CMP's  Gout: No longer taking allopurinol    Microcytic Anemia: Hgb/Hct Trend:  Recent Labs  Lab 06/16/24 1724 06/16/24 1724 07/05/24 1006 07/06/24 0518 07/07/24 1023 07/08/24 0513  HGB 12.9*  --  11.8* 11.2* 11.7* 11.5*  HCT 38.0*   --  38.8* 36.1* 38.0* 37.7*  MCV  --    < > 80.8 77.5* 78.0* 78.4*   < > = values in this interval not displayed.  -Check Anemia Panel within 1 week. CTM for S/Sx of Bleeding; No overt bleeding noted. Repeat CBC in the AM  Hypoalbuminemia: Patient's Albumin Lvl was 3.4. CTM & Trend & repeat within 1 week  Class I Obesity: Complicates overall prognosis and care. Estimated body mass index is 31.48 kg/m as calculated from the following:   Height as of this encounter: 5' 3 (1.6 m).   Weight as of this encounter: 80.6 kg. Weight Loss and Dietary Counseling given

## 2024-07-06 NOTE — Progress Notes (Addendum)
"  °  Progress Note  Patient Name: Troy Singh. Date of Encounter: 07/06/2024 [facility] Cardiologist: None   Interval Summary   Breathing has improved significantly, making good urine.  No chest pain today.  Vital Signs Vitals:   07/05/24 2013 07/05/24 2044 07/06/24 0229 07/06/24 0531  BP: 126/85 119/74 (!) 152/87 117/77  Pulse: (!) 55 (!) 48 62 (!) 59  Resp: 19  16 18   Temp: 98.3 F (36.8 C)  98.5 F (36.9 C) 98.4 F (36.9 C)  TempSrc: Oral     SpO2: 97%  96% 93%  Weight:      Height:        Intake/Output Summary (Last 24 hours) at 07/06/2024 9077 Last data filed at 07/06/2024 0239 Gross per 24 hour  Intake 600 ml  Output 3950 ml  Net -3350 ml      07/05/2024    4:35 PM 07/05/2024    9:44 AM 07/05/2024    9:15 AM  Last 3 Weights  Weight (lbs) 187 lb 8 oz 193 lb 193 lb  Weight (kg) 85.049 kg 87.544 kg 87.544 kg      Telemetry/ECG  Sinus rhythm, 5 beats of NSVT.  PVCs occasional- Personally Reviewed  Physical Exam  GEN: No acute distress.   Neck: No JVD Cardiac: RRR, no murmurs, rubs, or gallops.  Respiratory: Clear to auscultation bilaterally. GI: Soft, nontender, non-distended  MS: 2+ edema L>R  Patient Profile Patient with past medical history significant for NICM with reduced EF, ATTR amyloid, nonobstructive CAD by heart cath 2023, history of cerebral infarcts, hypertension, type 2 diabetes.  Patient admitted for CHF exacerbation and and had not been on medication 6 months prior secondary to finances.  Assessment & Plan   Acute on chronic HFrEF -06/2022 LHC with nonobstructive CAD.  PYP strongly suggestive of TTR amyloid, negative myeloma panel.  Mother with history of HCM -05/2023 EF 25 to 30%, severely dilated LV, moderate LVH, G3 DD, normal RV. Lost in follow-up, off medications secondary to finances for 6 months.  Reported worsening DOE, started on IV Lasix  40 mg twice daily with significant improvement in symptoms with good response  to diuretics.  Negative almost 4 L in the last 24 hours.  Still volume up with peripheral edema but doing better. GDMT: Continue with IV Lasix  40 mg twice daily, BiDil  20-37.5 mg 3 times daily, spironolactone  12.5 mg, Losartan  50. We will continue to reconcile medications and titrate GDMT once euvolemic.  Hold beta-blocker for now until we get EF back.  Echo pending. Working with pharmacy now to figure out what medications he is able to afford and to help with patient assistance. Arranging follow-up with advanced heart failure.  Overdue.  Initially had plans for starting tafamidis and cardiac MRI.  Nonobstructive CAD Noted on heart cath as above.  No chest pain.  Troponins were 492-458.  Suspect demand ischemia in the setting of CHF exacerbation.  Continue aspirin  81 mg, atorvastatin  40 mg.  CKD Creatinine improving with diuresis, ideally would be on SGLT2 inhibitor.   For questions or updates, please contact [facility] Please consult [email or web address] for contact info under       Signed, Thom LITTIE Sluder, PA-C    "

## 2024-07-07 ENCOUNTER — Other Ambulatory Visit: Payer: Self-pay

## 2024-07-07 DIAGNOSIS — I5043 Acute on chronic combined systolic (congestive) and diastolic (congestive) heart failure: Secondary | ICD-10-CM

## 2024-07-07 DIAGNOSIS — I10 Essential (primary) hypertension: Secondary | ICD-10-CM

## 2024-07-07 DIAGNOSIS — E119 Type 2 diabetes mellitus without complications: Secondary | ICD-10-CM

## 2024-07-07 DIAGNOSIS — E7800 Pure hypercholesterolemia, unspecified: Secondary | ICD-10-CM

## 2024-07-07 DIAGNOSIS — Z6834 Body mass index (BMI) 34.0-34.9, adult: Secondary | ICD-10-CM

## 2024-07-07 DIAGNOSIS — E6609 Other obesity due to excess calories: Secondary | ICD-10-CM

## 2024-07-07 DIAGNOSIS — E66811 Obesity, class 1: Secondary | ICD-10-CM

## 2024-07-07 LAB — BASIC METABOLIC PANEL WITH GFR
Anion gap: 12 (ref 5–15)
BUN: 30 mg/dL — ABNORMAL HIGH (ref 6–20)
CO2: 25 mmol/L (ref 22–32)
Calcium: 9.5 mg/dL (ref 8.9–10.3)
Chloride: 102 mmol/L (ref 98–111)
Creatinine, Ser: 1.72 mg/dL — ABNORMAL HIGH (ref 0.61–1.24)
GFR, Estimated: 47 mL/min — ABNORMAL LOW
Glucose, Bld: 92 mg/dL (ref 70–99)
Potassium: 4.2 mmol/L (ref 3.5–5.1)
Sodium: 139 mmol/L (ref 135–145)

## 2024-07-07 LAB — MAGNESIUM: Magnesium: 2.2 mg/dL (ref 1.7–2.4)

## 2024-07-07 LAB — CBC WITH DIFFERENTIAL/PLATELET
Abs Immature Granulocytes: 0.01 K/uL (ref 0.00–0.07)
Basophils Absolute: 0 K/uL (ref 0.0–0.1)
Basophils Relative: 0 %
Eosinophils Absolute: 0.2 K/uL (ref 0.0–0.5)
Eosinophils Relative: 2 %
HCT: 38 % — ABNORMAL LOW (ref 39.0–52.0)
Hemoglobin: 11.7 g/dL — ABNORMAL LOW (ref 13.0–17.0)
Immature Granulocytes: 0 %
Lymphocytes Relative: 13 %
Lymphs Abs: 0.8 K/uL (ref 0.7–4.0)
MCH: 24 pg — ABNORMAL LOW (ref 26.0–34.0)
MCHC: 30.8 g/dL (ref 30.0–36.0)
MCV: 78 fL — ABNORMAL LOW (ref 80.0–100.0)
Monocytes Absolute: 0.7 K/uL (ref 0.1–1.0)
Monocytes Relative: 11 %
Neutro Abs: 4.6 K/uL (ref 1.7–7.7)
Neutrophils Relative %: 74 %
Platelets: 295 K/uL (ref 150–400)
RBC: 4.87 MIL/uL (ref 4.22–5.81)
RDW: 18.3 % — ABNORMAL HIGH (ref 11.5–15.5)
WBC: 6.3 K/uL (ref 4.0–10.5)
nRBC: 0 % (ref 0.0–0.2)

## 2024-07-07 LAB — KAPPA/LAMBDA LIGHT CHAINS
Kappa free light chain: 39.3 mg/L — ABNORMAL HIGH (ref 3.3–19.4)
Kappa, lambda light chain ratio: 1.4 (ref 0.26–1.65)
Lambda free light chains: 28 mg/L — ABNORMAL HIGH (ref 5.7–26.3)

## 2024-07-07 MED ORDER — FUROSEMIDE 40 MG PO TABS
40.0000 mg | ORAL_TABLET | Freq: Every day | ORAL | Status: DC
  Administered 2024-07-08: 40 mg via ORAL
  Filled 2024-07-07: qty 1

## 2024-07-07 NOTE — Progress Notes (Signed)
 " PROGRESS NOTE    Troy Singh.  FMW:[id number] DOB: 1971/05/15 DOA: 07/05/2024 PCP: Jaycee Greig PARAS, NP   Brief Narrative:  The patient is a 53 y.o. male with medical history significant of HTN, HLD, and chronic combined CHF who presented on 12/22 with LE edema and SOB.  He reports that he was feeling some chest pressure, feels that periodically and it wasn't going away.  +SOB, unable to take a deep breath.  No cough.  +LE edema.  He is only currently taking amlodipine , has not taken others within the last 6 months or so because he ran out.  He does not check his BP at home.  Symptoms are more with exertion, ok while at rest.  He needed O2 on arrival but this has been removed. Currently being admitted for acute on chronic combined CHF exacerbation and cardiology has been consulted and he was initiated on IV diuresis with now been transition to p.o.  Anticipating discharge in the next 24 hours.   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 Patient with known h/o chronic combined CHF presenting with worsening SOB and edema; CXR consistent with mild pulmonary edema -Pro-BNP 1332 -Patient had pulmonary edema requiring new O2 therapy with O2 sat 87% on presentation -12/23 Echo EF 25-30%, G2DD, normal RVF, trivial MR -Started ASA 81 mg po Daily. C/w Losartan  50 mg po Daily, Isosorbide -Hydralazine  20-37.5 mg per tab 1 tab po TID, and Spironolactone  12.5 mg po Dialy -Cardiology was consulted and he was diuresed with IV Furosemide  and transitioned to po Furosemide  40 mg po Daily today. -Continue [place] O2 for now prn>>weaned to RA -Mildly elevated HS troponin is likely related to demand ischemia; doubt ACS based on symptoms -C/w Heart Healthy/Carb Modified Diet w/ 1500 mL Fluid Restriction.  -Patient is Negative 6.860 Liters since admission and Weight is down 14 lbs since  admission -Cardiology recommended cardiac MRI un the outpatient setting and following up on Amyloid Panel.  Cardiology if his EF does not improve they need to consider ICD for primary prevention especially given brief episodes of NSVT on telemetry and will need routine follow-up.  Currently unable to start GDMT with beta-blocker given his bradycardia -Cardiology feels that we should monitor him overnight and continue diuresis and anticipate discharge on 11/07/2023  Pure Hypercholesterolemia: Lipid Panel Done and showed a total cholesterol/HDL ratio 4.2, cholesterol level 152, HDL 36, LDL 100, triglycerides 78, VLDL 16; C/w Atorvastatin  40 mg po Daily  Uncontrolled HTN: -Reports only taking amlodipine  because that is the only medication he has; D/C Amlodipine  in setting of suppressed EF -continue BiDil , spiro, losartan   AKI on CKD Stage 3a: Baseline Cr is ~1.3-1.6. BUN/Cr Trend improving with Diuresis: Recent Labs  Lab 06/16/24 1724 07/05/24 1006 07/05/24 2111 07/06/24 0518 07/07/24 0514  BUN 31* 21* 25* 24* 30*  CREATININE 1.80* 1.49* 1.95* 1.70* 1.72*  -Ideally needs to be on SGLT2 Inhibitor -Avoid Nephrotoxic Medications, Contrast Dyes, Hypotension and Dehydration to Ensure Adequate Renal Perfusion and will need to Renally Adjust Meds. CTM & Trend Renal Function carefully & repeat CMP in the AM   Non-obstructive CAD: Had a LHC in 2023 which showed mild CAD; C/w ASA 81 mg po Daily   Diet Controlled Diabetes Mellitus Type 2: 12/23 HbA1c was 6.2.  Effectively diet controlled at this time; No indication to start medication at this time. CTM Blood Sugars per  Protocol. C/w Diet as above  Gout: No longer taking allopurinol    Microcytic Anemia: Hgb/Hct Trend:  Recent Labs  Lab 06/16/24 1724 06/16/24 1724 07/05/24 1006 07/06/24 0518  HGB 12.9*  --  11.8* 11.2*  HCT 38.0*  --  38.8* 36.1*  MCV  --    < > 80.8 77.5*   < > = values in this interval not displayed.  -Check Anemia Panel in  the AM. CTM for S/Sx of Bleeding; No overt bleeding noted. Repeat CBC in the AM  Class I Obesity: Complicates overall prognosis and care. Estimated body mass index is 31.87 kg/m as calculated from the following:   Height as of this encounter: 5' 3 (1.6 m).   Weight as of this encounter: 81.6 kg. Weight Loss and Dietary Counseling given   DVT prophylaxis: enoxaparin  (LOVENOX ) injection 40 mg Start: 07/05/24 2200    Code Status: Full Code Family Communication: No family present @ bedside  Disposition Plan:  Level of care: Telemetry Status is: Inpatient Remains inpatient appropriate because: Needs Cardiac Clearance; Cardiology recommends D/C 12/25 and Monitoring today   Consultants:  Cardiology   Procedures:  ECHOCardiogram  Antimicrobials:  Anti-infectives (From admission, onward)    None       Subjective: Seen and examined at bedside and thinks he is doing better.  Not as short of breath and thinks he still has some leg swelling but thinks it is improved.  No nausea or vomiting.  Denied any lightheadedness or dizziness.  No other concerns or complaints at this time.  Objective: Vitals:   07/06/24 2046 07/07/24 0454 07/07/24 0457 07/07/24 1345  BP: 127/82 (!) 149/84  107/71  Pulse: (!) 58 61  73  Resp: 15 16  16   Temp: 98.2 F (36.8 C) 98.6 F (37 C)  98.8 F (37.1 C)  TempSrc: Oral   Oral  SpO2: 100% 98%  99%  Weight:   81.6 kg   Height:        Intake/Output Summary (Last 24 hours) at 07/07/2024 1346 Last data filed at 07/07/2024 1300 Gross per 24 hour  Intake 960 ml  Output 3200 ml  Net -2240 ml   Filed Weights   07/05/24 0944 07/05/24 1635 07/07/24 0457  Weight: 87.5 kg 85 kg 81.6 kg   Examination: Physical Exam:  Constitutional: WN/WD obese African-American male no acute distress Respiratory: Diminished to auscultation bilaterally, no wheezing, rales, rhonchi or crackles. Normal respiratory effort and patient is not tachypenic. No accessory muscle  use.  Unlabored breathing Cardiovascular: RRR, no murmurs / rubs / gallops. S1 and S2 auscultated. 1+ lower extremity Abdomen: Soft, non-tender, distended secondary but habitus. Bowel sounds positive.  GU: Deferred. Musculoskeletal: No clubbing / cyanosis of digits/nails. No joint deformity upper and lower extremities.  Skin: No rashes, lesions, ulcers on limited skin evaluation. No induration; Warm and dry.  Neurologic: CN 2-12 grossly intact with no focal deficits. Romberg sign and cerebellar reflexes not assessed.  Psychiatric: Normal judgment and insight. Alert and oriented x 3. Normal mood and appropriate affect.   Data Reviewed: I have personally reviewed following labs and imaging studies  CBC: Recent Labs  Lab 07/05/24 1006 07/06/24 0518 07/07/24 1023  WBC 6.7 6.1 6.3  NEUTROABS  --   --  4.6  HGB 11.8* 11.2* 11.7*  HCT 38.8* 36.1* 38.0*  MCV 80.8 77.5* 78.0*  PLT 304 278 295   Basic Metabolic Panel: Recent Labs  Lab 07/05/24 1006 07/05/24 2111 07/06/24 0518 07/07/24 0514  NA 137 138 138 139  K 5.0 4.4 4.6 4.2  CL 104 103 101 102  CO2 23 26 23 25   GLUCOSE 97 114* 77 92  BUN 21* 25* 24* 30*  CREATININE 1.49* 1.95* 1.70* 1.72*  CALCIUM  9.5 9.5 9.7 9.5  MG  --   --   --  2.2   GFR: Estimated Creatinine Clearance: 46.4 mL/min (A) (by C-G formula based on SCr of 1.72 mg/dL (H)). Liver Function Tests: No results for input(s): AST, ALT, ALKPHOS, BILITOT, PROT, ALBUMIN in the last 168 hours. No results for input(s): LIPASE, AMYLASE in the last 168 hours. No results for input(s): AMMONIA in the last 168 hours. Coagulation Profile: No results for input(s): INR, PROTIME in the last 168 hours. Cardiac Enzymes: No results for input(s): CKTOTAL, CKMB, CKMBINDEX, TROPONINI in the last 168 hours. BNP (last 3 results) Recent Labs    07/05/24 1536  PROBNP 8,667.0*   HbA1C: Recent Labs    07/06/24 0518  HGBA1C 6.2*   CBG: No results  for input(s): GLUCAP in the last 168 hours. Lipid Profile: Recent Labs    07/06/24 0518  CHOL 152  HDL 36*  LDLCALC 100*  TRIG 78  CHOLHDL 4.2   Thyroid Function Tests: No results for input(s): TSH, T4TOTAL, FREET4, T3FREE, THYROIDAB in the last 72 hours. Anemia Panel: No results for input(s): VITAMINB12, FOLATE, FERRITIN, TIBC, IRON, RETICCTPCT in the last 72 hours. Sepsis Labs: No results for input(s): PROCALCITON, LATICACIDVEN in the last 168 hours.  No results found for this or any previous visit (from the past 240 hours).   Radiology Studies: ECHOCARDIOGRAM COMPLETE Result Date: 07/06/2024    ECHOCARDIOGRAM REPORT   Patient Name:   Troy Singh. Date of Exam: 07/06/2024 Medical Rec #:  [id number]         Height:       63.0 in Accession #:    [id number]        Weight:       187.5 lb Date of Birth:  [birth year]         BSA:          1.881 m Patient Age:    53 years          BP:           117/77 mmHg Patient Gender: M                 HR:           99 bpm. Exam Location:  Inpatient Procedure: 2D Echo, Cardiac Doppler and Color Doppler (Both Spectral and Color            Flow Doppler were utilized during procedure). Indications:    CHF-Acute Systolic 428.21 / I50.21  History:        Patient has prior history of Echocardiogram examinations, most                 recent 05/30/2023.  Sonographer:    Tinnie Gosling RDCS Referring Phys: [id number] JENNIFER YATES IMPRESSIONS  1. Left ventricular ejection fraction, by estimation, is 25 to 30%. The left ventricle has severely decreased function. The left ventricle demonstrates global hypokinesis. There is moderate concentric left ventricular hypertrophy. Left ventricular diastolic parameters are consistent with Grade II diastolic dysfunction (pseudonormalization).  2. Right ventricular systolic function is normal. The right ventricular size is normal. Moderately increased right ventricular wall thickness.  3. Left atrial size was  severely dilated.  4. Right atrial size  was moderately dilated.  5. The mitral valve is normal in structure. Trivial mitral valve regurgitation. No evidence of mitral stenosis.  6. The aortic valve is tricuspid. There is mild calcification of the aortic valve. Aortic valve regurgitation is not visualized. Aortic valve sclerosis/calcification is present, without any evidence of aortic stenosis.  7. The inferior vena cava is dilated in size with <50% respiratory variability, suggesting right atrial pressure of 15 mmHg. Conclusion(s)/Recommendation(s): There is biventricular hypertrophy. Consider cMRI for evalaution of infiltrative process if clinically indicated. FINDINGS  Left Ventricle: Left ventricular ejection fraction, by estimation, is 25 to 30%. The left ventricle has severely decreased function. The left ventricle demonstrates global hypokinesis. The left ventricular internal cavity size was normal in size. There is moderate concentric left ventricular hypertrophy. Left ventricular diastolic parameters are consistent with Grade II diastolic dysfunction (pseudonormalization). Right Ventricle: The right ventricular size is normal. Moderately increased right ventricular wall thickness. Right ventricular systolic function is normal. Left Atrium: Left atrial size was severely dilated. Right Atrium: Right atrial size was moderately dilated. Pericardium: There is no evidence of pericardial effusion. Mitral Valve: The mitral valve is normal in structure. Trivial mitral valve regurgitation. No evidence of mitral valve stenosis. Tricuspid Valve: The tricuspid valve is normal in structure. Tricuspid valve regurgitation is trivial. No evidence of tricuspid stenosis. Aortic Valve: The aortic valve is tricuspid. There is mild calcification of the aortic valve. Aortic valve regurgitation is not visualized. Aortic valve sclerosis/calcification is present, without any evidence of aortic stenosis. Pulmonic Valve: The pulmonic  valve was normal in structure. Pulmonic valve regurgitation is not visualized. No evidence of pulmonic stenosis. Aorta: The aortic root is normal in size and structure. Venous: The inferior vena cava is dilated in size with less than 50% respiratory variability, suggesting right atrial pressure of 15 mmHg. IAS/Shunts: No atrial level shunt detected by color flow Doppler.  LEFT VENTRICLE PLAX 2D LVIDd:         6.60 cm      Diastology LVIDs:         5.20 cm      LV e' medial:    5.44 cm/s LV PW:         1.30 cm      LV E/e' medial:  14.0 LV IVS:        1.30 cm      LV e' lateral:   2.94 cm/s LVOT diam:     2.30 cm      LV E/e' lateral: 26.0 LV SV:         78 LV SV Index:   41 LVOT Area:     4.15 cm  LV Volumes (MOD) LV vol d, MOD A2C: 212.0 ml LV vol d, MOD A4C: 222.0 ml LV vol s, MOD A2C: 103.0 ml LV vol s, MOD A4C: 147.0 ml LV SV MOD A2C:     109.0 ml LV SV MOD A4C:     222.0 ml LV SV MOD BP:      97.0 ml RIGHT VENTRICLE             IVC RV S prime:     12.90 cm/s  IVC diam: 2.20 cm TAPSE (M-mode): 1.4 cm LEFT ATRIUM             Index        RIGHT ATRIUM           Index LA diam:        4.40 cm 2.34 cm/m  RA Area:     17.30 cm LA Vol (A2C):   83.4 ml 44.33 ml/m  RA Volume:   50.40 ml  26.79 ml/m LA Vol (A4C):   85.4 ml 45.39 ml/m LA Biplane Vol: 84.7 ml 45.02 ml/m  AORTIC VALVE LVOT Vmax:   112.00 cm/s LVOT Vmean:  75.000 cm/s LVOT VTI:    0.187 m  AORTA Ao Root diam: 3.30 cm Ao Asc diam:  3.50 cm MITRAL VALVE MV Area (PHT): 2.93 cm    SHUNTS MV E velocity: 76.30 cm/s  Systemic VTI:  0.19 m MV A velocity: 63.00 cm/s  Systemic Diam: 2.30 cm MV E/A ratio:  1.21 Toribio Fuel MD Electronically signed by Toribio Fuel MD Signature Date/Time: 07/06/2024/12:06:01 PM    Final    Scheduled Meds:  aspirin  EC  81 mg Oral Daily   atorvastatin   40 mg Oral Daily   docusate sodium   100 mg Oral BID   enoxaparin  (LOVENOX ) injection  40 mg Subcutaneous Q24H   [START ON 07/08/2024] furosemide   40 mg Oral Daily    isosorbide -hydrALAZINE   1 tablet Oral TID   losartan   50 mg Oral Daily   sodium chloride  flush  3 mL Intravenous Q12H   spironolactone   12.5 mg Oral Daily   Continuous Infusions:   LOS: 2 days   Alejandro Marker, DO [facility] Available via Epic secure chat 7am-7pm After these hours, please refer to coverage provider listed on [email or web address] 07/07/2024, 1:46 PM  "

## 2024-07-07 NOTE — Progress Notes (Signed)
"  °  Progress Note  Patient Name: Troy Singh. Date of Encounter: 07/07/2024 [facility] Cardiologist: None To be plugged in with AHF  Interval Summary   Reports improvement in HF symptoms. Doing well today.  Long discussion about the importance of routine follow up and medication adherence. Educated patient that lifestyle changes alone will not cure his HF.   Vital Signs Vitals:   07/06/24 1700 07/06/24 2046 07/07/24 0454 07/07/24 0457  BP: (!) 140/95 127/82 (!) 149/84   Pulse:  (!) 58 61   Resp:  15 16   Temp:  98.2 F (36.8 C) 98.6 F (37 C)   TempSrc:  Oral    SpO2:  100% 98%   Weight:    81.6 kg  Height:        Intake/Output Summary (Last 24 hours) at 07/07/2024 0952 Last data filed at 07/07/2024 0943 Gross per 24 hour  Intake 960 ml  Output 3450 ml  Net -2490 ml      07/07/2024    4:57 AM 07/05/2024    4:35 PM 07/05/2024    9:44 AM  Last 3 Weights  Weight (lbs) 179 lb 14.3 oz 187 lb 8 oz 193 lb  Weight (kg) 81.6 kg 85.049 kg 87.544 kg      Telemetry/ECG  Sinus rhythm HR avg < 60, PVCs and two episodes of NSVT 6 beats and 4 beats - Personally Reviewed  Physical Exam  GEN: No acute distress.   Neck: No JVD Cardiac: RRR, no murmurs, rubs, or gallops.  Respiratory: Clear to auscultation bilaterally. GI: Soft, nontender, non-distended  MS: No edema  Assessment & Plan  Troy Christopher. is a 53 y.o. 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   Acute on chronic HFrEF Patient still receiving IV diuresis.  Net IO Since Admission: -6,300 mL [07/07/24 0954] Weight -14lb this admission On exam, appears euvolemic.   Pursue cMR outpatient. Amyloid panel pending. If EF does not improve, do need to consider ICD for primary prevention especially given brief episodes of NSVT on telemetry. Patient will need routine  follow. Unable to start BB with bradycardia.    Last BP prior to medication administration, will hold on aggressive titration though if BP remains elevated could consider increasing to two bi-dil tablets.   Would stop IV diuresis and transition to oral lasix  40 mg tomorrow with additional 20 mg for edema and weight gain.   BB defer 2/2 bradycardia Continue bi-dil 20-37.5 mg TID Continue cozaar  50 mg Continue spironolactone  12.5 mg  Hold off on SGLT2i and entresto  2/2 renal function this admission, patient to follow up outpatient with AHF and would benefit from pharmacy visit as well for financial assistance.   Elevated Troponin  Suspected to be 2/2 demand ischemia  Hypertension BP: 149/84 Medications as above  Non-obstructive CAD LHC in 2023 showed mild CAD.  Continue ASA 81 mg  Hyperlipidemia LDL 100  HDL 36 Continue lipitor 40 mg, restarted this admission will need repeat LFT and lipid panel in 6-8 weeks  Per primary T2DM Gout Obesity Hx of CVA CKD    For questions or updates, please contact [facility] Please consult [email or web address] for contact info under       Signed, Leontine LOISE Salen, PA-C   "

## 2024-07-07 NOTE — Plan of Care (Signed)

## 2024-07-08 ENCOUNTER — Inpatient Hospital Stay (HOSPITAL_COMMUNITY): Payer: Self-pay

## 2024-07-08 LAB — CBC WITH DIFFERENTIAL/PLATELET
Abs Immature Granulocytes: 0.01 K/uL (ref 0.00–0.07)
Basophils Absolute: 0 K/uL (ref 0.0–0.1)
Basophils Relative: 0 %
Eosinophils Absolute: 0.2 K/uL (ref 0.0–0.5)
Eosinophils Relative: 3 %
HCT: 37.7 % — ABNORMAL LOW (ref 39.0–52.0)
Hemoglobin: 11.5 g/dL — ABNORMAL LOW (ref 13.0–17.0)
Immature Granulocytes: 0 %
Lymphocytes Relative: 17 %
Lymphs Abs: 1.1 K/uL (ref 0.7–4.0)
MCH: 23.9 pg — ABNORMAL LOW (ref 26.0–34.0)
MCHC: 30.5 g/dL (ref 30.0–36.0)
MCV: 78.4 fL — ABNORMAL LOW (ref 80.0–100.0)
Monocytes Absolute: 0.9 K/uL (ref 0.1–1.0)
Monocytes Relative: 13 %
Neutro Abs: 4.5 K/uL (ref 1.7–7.7)
Neutrophils Relative %: 67 %
Platelets: 273 K/uL (ref 150–400)
RBC: 4.81 MIL/uL (ref 4.22–5.81)
RDW: 18.2 % — ABNORMAL HIGH (ref 11.5–15.5)
WBC: 6.7 K/uL (ref 4.0–10.5)
nRBC: 0 % (ref 0.0–0.2)

## 2024-07-08 LAB — COMPREHENSIVE METABOLIC PANEL WITH GFR
ALT: 16 U/L (ref 0–44)
AST: 21 U/L (ref 15–41)
Albumin: 3.4 g/dL — ABNORMAL LOW (ref 3.5–5.0)
Alkaline Phosphatase: 123 U/L (ref 38–126)
Anion gap: 12 (ref 5–15)
BUN: 28 mg/dL — ABNORMAL HIGH (ref 6–20)
CO2: 25 mmol/L (ref 22–32)
Calcium: 9.6 mg/dL (ref 8.9–10.3)
Chloride: 101 mmol/L (ref 98–111)
Creatinine, Ser: 1.59 mg/dL — ABNORMAL HIGH (ref 0.61–1.24)
GFR, Estimated: 51 mL/min — ABNORMAL LOW
Glucose, Bld: 85 mg/dL (ref 70–99)
Potassium: 4.3 mmol/L (ref 3.5–5.1)
Sodium: 138 mmol/L (ref 135–145)
Total Bilirubin: 0.7 mg/dL (ref 0.0–1.2)
Total Protein: 6.9 g/dL (ref 6.5–8.1)

## 2024-07-08 LAB — MAGNESIUM: Magnesium: 2.2 mg/dL (ref 1.7–2.4)

## 2024-07-08 LAB — PHOSPHORUS: Phosphorus: 4 mg/dL (ref 2.5–4.6)

## 2024-07-08 MED ORDER — ISOSORB DINITRATE-HYDRALAZINE 20-37.5 MG PO TABS: 1.0000 | ORAL_TABLET | Freq: Three times a day (TID) | ORAL | 0 refills | Status: DC

## 2024-07-08 MED ORDER — POLYETHYLENE GLYCOL 3350 17 G PO PACK: 17.0000 g | PACK | Freq: Every day | ORAL | 0 refills | Status: AC | PRN

## 2024-07-08 MED ORDER — POLYETHYLENE GLYCOL 3350 17 G PO PACK: 17.0000 g | PACK | Freq: Every day | ORAL | 0 refills | Status: DC | PRN

## 2024-07-08 MED ORDER — DOCUSATE SODIUM 100 MG PO CAPS: 100.0000 mg | ORAL_CAPSULE | Freq: Two times a day (BID) | ORAL | 0 refills | Status: DC

## 2024-07-08 MED ORDER — SPIRONOLACTONE 25 MG PO TABS: 12.5000 mg | ORAL_TABLET | Freq: Every day | ORAL | 0 refills | Status: DC

## 2024-07-08 MED ORDER — LOSARTAN POTASSIUM 50 MG PO TABS: 50.0000 mg | ORAL_TABLET | Freq: Every day | ORAL | 0 refills | Status: AC

## 2024-07-08 MED ORDER — FUROSEMIDE 40 MG PO TABS: 40.0000 mg | ORAL_TABLET | Freq: Every day | ORAL | 0 refills | Status: DC

## 2024-07-08 MED ORDER — LOSARTAN POTASSIUM 50 MG PO TABS: 50.0000 mg | ORAL_TABLET | Freq: Every day | ORAL | 0 refills | Status: DC

## 2024-07-08 MED ORDER — DOCUSATE SODIUM 100 MG PO CAPS: 100.0000 mg | ORAL_CAPSULE | Freq: Two times a day (BID) | ORAL | 0 refills | Status: AC

## 2024-07-08 MED ORDER — ONDANSETRON HCL 4 MG PO TABS: 4.0000 mg | ORAL_TABLET | Freq: Four times a day (QID) | ORAL | 0 refills | Status: DC | PRN

## 2024-07-08 MED ORDER — ONDANSETRON HCL 4 MG PO TABS: 4.0000 mg | ORAL_TABLET | Freq: Four times a day (QID) | ORAL | 0 refills | Status: AC | PRN

## 2024-07-08 NOTE — Plan of Care (Signed)

## 2024-07-08 NOTE — Progress Notes (Signed)
 Discharge instructions reviewed with patient, verbalized understanding. All questions answered. All belongings accounted for. Patient to follow up with MD in  1-2 weeks. PIV removed.   Medications sent to [facility] on [address] in [place]

## 2024-07-08 NOTE — Progress Notes (Signed)
"  °  Progress Note  Patient Name: Troy Singh. Date of Encounter: 07/08/2024 [facility] Cardiologist: None To be plugged in with AHF  Interval Summary   Ready for d/c on oral diuretics good diuresis   Vital Signs Vitals:   07/07/24 1345 07/07/24 2111 07/08/24 0440 07/08/24 0535  BP: 107/71 (!) 147/83  130/78  Pulse: 73 72  (!) 56  Resp: 16 18  16   Temp: 98.8 F (37.1 C) 98.8 F (37.1 C)  98.2 F (36.8 C)  TempSrc: Oral Oral  Oral  SpO2: 99% 96%  98%  Weight:   80.6 kg   Height:        Intake/Output Summary (Last 24 hours) at 07/08/2024 0705 Last data filed at 07/08/2024 0400 Gross per 24 hour  Intake 1680 ml  Output 3750 ml  Net -2070 ml      07/08/2024    4:40 AM 07/07/2024    4:57 AM 07/05/2024    4:35 PM  Last 3 Weights  Weight (lbs) 177 lb 11.1 oz 179 lb 14.3 oz 187 lb 8 oz  Weight (kg) 80.6 kg 81.6 kg 85.049 kg      Telemetry/ECG  Sinus rhythm HR avg < 60, PVCs and two episodes of NSVT 6 beats and 4 beats - Personally Reviewed  Physical Exam  GEN: No acute distress.   Neck: No JVD Cardiac: RRR, no murmurs, rubs, or gallops.  Respiratory: Clear to auscultation bilaterally. GI: Soft, nontender, non-distended  MS: No edema  Assessment & Plan  Troy Singh. is a 53 y.o. 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   Acute on chronic HFrEF Oral lasix   Net IO Since Admission: -7,550 mL [07/08/24 0705] Weight -16lb this admission On exam, appears euvolemic.   Pursue cMR outpatient. Amyloid panel pending. If EF does not improve, do need to consider ICD for primary prevention especially given brief episodes of NSVT on telemetry. Patient will need routine follow. Unable to start BB with bradycardia. No beta blocker    Last BP prior to medication administration, will hold on aggressive titration though  if BP remains elevated could consider increasing to two bi-dil tablets.   BB defer 2/2 bradycardia Continue bi-dil 20-37.5 mg TID Continue cozaar  50 mg Continue spironolactone  12.5 mg  Continue Lasix  40 mg daily  Hold off on SGLT2i and entresto  2/2 renal function this admission, patient to follow up outpatient with AHF and would benefit from pharmacy visit as well for financial assistance.   Elevated Troponin  Suspected to be 2/2 demand ischemia  Hypertension BP: 149/84 Medications as above  Non-obstructive CAD LHC in 2023 showed mild CAD.  Continue ASA 81 mg  Hyperlipidemia LDL 100  HDL 36 Continue lipitor 40 mg, restarted this admission will need repeat LFT and lipid panel in 6-8 weeks  Per primary T2DM Gout Obesity Hx of CVA CKD  Outpatient f/u with CHF clinic made  Dr JINNY Ross on call today for any further questions    For questions or updates, please contact  [facility] Please consult [email or web address] for contact info under       Signed, Maude Emmer, MD   "

## 2024-07-09 ENCOUNTER — Telehealth: Payer: Self-pay | Admitting: *Deleted

## 2024-07-09 LAB — IMMUNOFIXATION, URINE

## 2024-07-09 NOTE — Transitions of Care (Post Inpatient/ED Visit) (Signed)
 "  07/09/2024  Name: Troy Singh. MRN: [id number] DOB: [birth year]  Today's TOC FU Call Status: Today's TOC FU Call Status:: Successful TOC FU Call Completed TOC FU Call Complete Date: 07/09/24  Patient's Name and Date of Birth confirmed. Name, DOB  Transition Care Management Follow-up Telephone Call Date of Discharge: 07/08/24 Discharge Facility: [facility]) Type of Discharge: Inpatient Admission Primary Inpatient Discharge Diagnosis:: Acute on chronic combined systolic (congestive) and diastolic (congestive) heart failure How have you been since you were released from the hospital?: Better Any questions or concerns?: No  Items Reviewed: Did you receive and understand the discharge instructions provided?: Yes Medications obtained,verified, and reconciled?: Yes (Medications Reviewed) Any new allergies since your discharge?: No Dietary orders reviewed?: Yes Type of Diet Ordered:: low sodium heart healthy 1500 mL Fluid Restriction Do you have support at home?: Yes People in Home [RPT]: sibling(s) Name of Support/Comfort Primary Source: Sister/Cassandra Williams  Medications Reviewed Today: Medications Reviewed Today     Reviewed by Lucky Andrea LABOR, RN (Registered Nurse) on 07/09/24 at 1459  Med List Status: <None>   Medication Order Taking? Sig Documenting Provider Last Dose Status Informant  acetaminophen  (TYLENOL ) 500 MG tablet [id number]  Take 1,000 mg by mouth every 6 (six) hours as needed for mild pain.  Patient not taking: Reported on 07/09/2024   [provider]  Active Self, Pharmacy Records  aspirin  EC 81 MG tablet [id number]  Take 1 tablet (81 mg total) by mouth daily. Swallow whole.  Patient not taking: Reported on 07/09/2024   Gonfa, Taye T, MD  Active Self, Pharmacy Records  atorvastatin  (LIPITOR) 40 MG tablet [id number]  Take 1 tablet (40 mg total) by mouth daily.  Patient not taking: Reported on 07/09/2024   Colletta Manuelita Garre, PA-C  Active  Self, Pharmacy Records  docusate sodium  (COLACE) 100 MG capsule [id number]  Take 1 capsule (100 mg total) by mouth 2 (two) times daily.  Patient not taking: Reported on 07/09/2024   Sheikh, Omair Latif, DO  Active   furosemide  (LASIX ) 40 MG tablet [id number] Yes Take 1 tablet (40 mg total) by mouth daily. Sheikh, Omair [place], DO  Active   isosorbide -hydrALAZINE  (BIDIL ) 20-37.5 MG tablet [id number] Yes Take 1 tablet by mouth 3 (three) times daily. Sheikh, Omair [place], [place]  Active   losartan  (COZAAR ) 50 MG tablet [id number] Yes Take 1 tablet (50 mg total) by mouth daily. Sheikh, Omair Latif, DO  Active   metFORMIN  (GLUCOPHAGE -XR) 500 MG 24 hr tablet [id number]  Take 1 tablet (500 mg total) by mouth daily with breakfast.  Patient not taking: Reported on 07/09/2024   Jaycee Greig PARAS, NP  Expired 09/26/22 2359   ondansetron  (ZOFRAN ) 4 MG tablet [id number]  Take 1 tablet (4 mg total) by mouth every 6 (six) hours as needed for nausea.  Patient not taking: Reported on 07/09/2024   Sheikh, Omair Latif, DO  Active   polyethylene glycol (MIRALAX  / GLYCOLAX ) 17 g packet [id number]  Take 17 g by mouth daily as needed for mild constipation.  Patient not taking: Reported on 07/09/2024   Sheikh, Omair Latif, DO  Active   spironolactone  (ALDACTONE ) 25 MG tablet [id number] Yes Take 0.5 tablets (12.5 mg total) by mouth daily. Sherrill Cable [place], DO  Active             Home Care and Equipment/Supplies: Were Home Health Services Ordered?: No Any new equipment or medical supplies ordered?: No  Functional Questionnaire: Do you need assistance with bathing/showering or  dressing?: No Do you need assistance with meal preparation?: No Do you need assistance with eating?: No Do you have difficulty maintaining continence: No Do you need assistance with getting out of bed/getting out of a chair/moving?: No Do you have difficulty managing or taking your medications?: No  Follow up appointments reviewed: PCP Follow-up  appointment confirmed?: No (RNCM assisted with scheduling on 07/20/24) MD Provider Line Number:[phone] Given: No Specialist Hospital Follow-up appointment confirmed?: Yes Date of Specialist follow-up appointment?: 07/20/24 Follow-Up Specialty Provider:: HF Clinic Do you need transportation to your follow-up appointment?: No Do you understand care options if your condition(s) worsen?: Yes-patient verbalized understanding  SDOH Interventions Today    Flowsheet Row Most Recent Value  SDOH Interventions   Food Insecurity Interventions Intervention Not Indicated  Housing Interventions Intervention Not Indicated  Transportation Interventions Intervention Not Indicated  Utilities Interventions Intervention Not Indicated    Andrea Dimes RN, BSN [place]  [facility] RN Care Manager [phone]  "

## 2024-07-10 NOTE — Discharge Summary (Signed)
 " Physician Discharge Summary   Patient: Troy Singh. MRN: [id number] DOB: [birth year]  Admit date:     07/05/2024  Discharge date: 07/08/2024  Discharge Physician: Alejandro Marker, DO   PCP: Jaycee Greig PARAS, NP   Recommendations at discharge:   Follow-up with PCP within 1 to 2 weeks repeat CBC, CMP, mag, Phos within 1 week Follow-up with cardiology in outpatient setting within 1 to 2 weeks and continue compliance with medications  Discharge Diagnoses: Principal Problem:   Acute on chronic combined systolic (congestive) and diastolic (congestive) heart failure (HCC) Active Problems:   Diet-controlled diabetes mellitus (HCC)   Pure hypercholesterolemia   Gout   Uncontrolled hypertension   Class 1 obesity due to excess calories with body mass index (BMI) of 34.0 to 34.9 in adult  Resolved Problems:   * No resolved hospital problems. [facility] Course: The patient is a 53 y.o. male with medical history significant of HTN, HLD, and chronic combined CHF who presented on 12/22 with LE edema and SOB.  He reports that he was feeling some chest pressure, feels that periodically and it wasn't going away.  +SOB, unable to take a deep breath.  No cough.  +LE edema.  He is only currently taking amlodipine , has not taken others within the last 6 months or so because he ran out.  He does not check his BP at home.  Symptoms are more with exertion, ok while at rest.  He needed O2 on arrival but this has been removed. Currently being admitted for acute on chronic combined CHF exacerbation and cardiology has been consulted and he was initiated on IV diuresis with now been transition to p.o. he tolerated this well and cardiology cleared him for discharge he will need to follow-up with them within 1 to 2 weeks as well as following up with PCP given that he is medically stable.   Assessment and Plan:  Acute on Chronic Combined Systolic /HFrEF and G2 Diastolic CHF with Acute Respiratory Failure with Hypoxia:  In the setting of Medical Non-compliance. Patient with known h/o chronic combined CHF presenting with worsening SOB and edema; CXR consistent with mild pulmonary edema -Pro-BNP 1332 -Patient had pulmonary edema requiring new O2 therapy with O2 sat 87% on presentation -12/23 Echo EF 25-30%, G2DD, normal RVF, trivial MR -Started ASA 81 mg po Daily. C/w Losartan  50 mg po Daily, Isosorbide -Hydralazine  20-37.5 mg per tab 1 tab po TID, and Spironolactone  12.5 mg po Dialy -Cardiology was consulted and he was diuresed with IV Furosemide  and transitioned to po Furosemide  40 mg po Daily today. -Continue [place] O2 for now prn>>weaned to RA -Mildly elevated HS troponin is likely related to demand ischemia; doubt ACS based on symptoms -C/w Heart Healthy/Carb Modified Diet w/ 1500 mL Fluid Restriction.  -Patient is Negative 6.860 Liters since admission and Weight is down 14 lbs since admission -Cardiology recommended cardiac MRI un the outpatient setting and following up on Amyloid Panel.  Cardiology if his EF does not improve they need to consider ICD for primary prevention especially given brief episodes of NSVT on telemetry and will need routine follow-up.  Currently unable to start GDMT with beta-blocker given his bradycardia -Cardiology feels that we should monitor him overnight and continue diuresis and anticipate discharge on 11/07/2023  Pure Hypercholesterolemia: Lipid Panel Done and showed a total cholesterol/HDL ratio 4.2, cholesterol level 152, HDL 36, LDL 100, triglycerides 78, VLDL 16; C/w Atorvastatin  40 mg po Daily  Uncontrolled HTN: -Reports only taking amlodipine   because that is the only medication he has; D/C Amlodipine  in setting of suppressed EF -continue BiDil , spiro, losartan   AKI on CKD Stage 3a: Baseline Cr is ~1.3-1.6. BUN/Cr Trend improving with Diuresis: Recent Labs  Lab 06/16/24 1724 07/05/24 1006 07/05/24 2111 07/06/24 0518 07/07/24 0514 07/08/24 0513  BUN 31* 21* 25* 24* 30* 28*   CREATININE 1.80* 1.49* 1.95* 1.70* 1.72* 1.59*  -Ideally needs to be on SGLT2 Inhibitor -Avoid Nephrotoxic Medications, Contrast Dyes, Hypotension and Dehydration to Ensure Adequate Renal Perfusion and will need to Renally Adjust Meds. CTM & Trend Renal Function carefully & repeat CMP within 1 week  Non-obstructive CAD: Had a LHC in 2023 which showed mild CAD; C/w ASA 81 mg po Daily   Diet Controlled Diabetes Mellitus Type 2: 12/23 HbA1c was 6.2.  Effectively diet controlled at this time; No indication to start medication at this time. CTM Blood Sugars per Protocol. C/w Diet as above.  CBG fairly well-controlled on daily BMPs/CMP's  Gout: No longer taking allopurinol    Microcytic Anemia: Hgb/Hct Trend:  Recent Labs  Lab 06/16/24 1724 06/16/24 1724 07/05/24 1006 07/06/24 0518 07/07/24 1023 07/08/24 0513  HGB 12.9*  --  11.8* 11.2* 11.7* 11.5*  HCT 38.0*  --  38.8* 36.1* 38.0* 37.7*  MCV  --    < > 80.8 77.5* 78.0* 78.4*   < > = values in this interval not displayed.  -Check Anemia Panel within 1 week. CTM for S/Sx of Bleeding; No overt bleeding noted. Repeat CBC in the AM  Hypoalbuminemia: Patient's Albumin Lvl was 3.4. CTM & Trend & repeat within 1 week  Class I Obesity: Complicates overall prognosis and care. Estimated body mass index is 31.48 kg/m as calculated from the following:   Height as of this encounter: 5' 3 (1.6 m).   Weight as of this encounter: 80.6 kg. Weight Loss and Dietary Counseling given   Assessment and Plan: * Acute on chronic combined systolic (congestive) and diastolic (congestive) heart failure (HCC) Patient with known h/o chronic combined CHF presenting with worsening SOB and edema CXR consistent with mild pulmonary edema Pro-BNP is pending Acute decompensated CHF seems probable as diagnosis in the setting of medical non-compliance Will admit, as per the Emergency HF Mortality Risk Grade.  The patient had pulmonary edema requiring new O2 therapy with  O2 sat 87% on presentation Will request echocardiogram Start ASA Carvedilol  and losartan  resumed CHF order set utilized Cardiology is consulting Was given Lasix  60 mg x 1 in ER and will repeat with 40 mg IV BID Resume spironolactone  Continue [place] O2 for now prn Mildly elevated HS troponin is likely related to demand ischemia; doubt ACS based on symptoms  Class 1 obesity due to excess calories with body mass index (BMI) of 34.0 to 34.9 in adult Body mass index is 34.19 kg/m.SABRA  Weight loss should be encouraged Outpatient PCP/bariatric medicine f/u encouraged Significantly low or high BMI is associated with higher medical risk including morbidity and mortality   Uncontrolled hypertension Reports only taking amlodipine  because that is the only medication he has Carvedilol  resumed along with hydralazine , losartan , and Imdur  Will also add prn hydralazine   Gout No longer taking allopurinol   Pure hypercholesterolemia Resume Lipitor Check lipids in A<  Diet-controlled diabetes mellitus (HCC) Last A1c was 5.8 Appears to be effectively diet controlled at this time There is no indication to start medication at this time Will recheck A1c and follow with fasting labs for now Carb modified (and heart healthy) diet  Consultants: Cardiology Procedures performed: As delineated as above  Disposition: Home Diet recommendation:  Discharge Diet Orders (From admission, onward)     Start     Ordered   07/08/24 0000  Diet Carb Modified        07/08/24 0831   07/08/24 0000  Diet - low sodium heart healthy       Comments: 1500 mL Fluid Restriction   07/08/24 0831   07/08/24 0000  Diet Carb Modified       Comments: 1500 mL Fluid Restriction   07/08/24 1013   07/08/24 0000  Diet - low sodium heart healthy        07/08/24 1013           Cardiac and Carb modified diet DISCHARGE MEDICATION: Allergies as of 07/08/2024   No Known Allergies      Medication List     STOP taking these  medications    allopurinol  100 MG tablet Commonly known as: Zyloprim    amLODipine  5 MG tablet Commonly known as: NORVASC    carvedilol  6.25 MG tablet Commonly known as: COREG    hydrALAZINE  50 MG tablet Commonly known as: APRESOLINE    indomethacin  25 MG capsule Commonly known as: INDOCIN    isosorbide  mononitrate 30 MG 24 hr tablet Commonly known as: IMDUR    predniSONE  10 MG tablet Commonly known as: DELTASONE        TAKE these medications    acetaminophen  500 MG tablet Commonly known as: TYLENOL  Take 1,000 mg by mouth every 6 (six) hours as needed for mild pain.   aspirin  EC 81 MG tablet Take 1 tablet (81 mg total) by mouth daily. Swallow whole.   atorvastatin  40 MG tablet Commonly known as: LIPITOR Take 1 tablet (40 mg total) by mouth daily.   docusate sodium  100 MG capsule Commonly known as: COLACE Take 1 capsule (100 mg total) by mouth 2 (two) times daily.   furosemide  40 MG tablet Commonly known as: Lasix  Take 1 tablet (40 mg total) by mouth daily.   isosorbide -hydrALAZINE  20-37.5 MG tablet Commonly known as: BIDIL  Take 1 tablet by mouth 3 (three) times daily.   losartan  50 MG tablet Commonly known as: COZAAR  Take 1 tablet (50 mg total) by mouth daily.   metFORMIN  500 MG 24 hr tablet Commonly known as: GLUCOPHAGE -XR Take 1 tablet (500 mg total) by mouth daily with breakfast.   ondansetron  4 MG tablet Commonly known as: ZOFRAN  Take 1 tablet (4 mg total) by mouth every 6 (six) hours as needed for nausea.   polyethylene glycol 17 g packet Commonly known as: MIRALAX  / GLYCOLAX  Take 17 g by mouth daily as needed for mild constipation.   spironolactone  25 MG tablet Commonly known as: ALDACTONE  Take 0.5 tablets (12.5 mg total) by mouth daily. What changed: how much to take        Follow-up Information     Jaycee Greig PARAS, NP. Call in 1 week(s).   Specialty: Nurse Practitioner Contact information: [address] [place]  [address] [phone]                Discharge Exam: Filed Weights   07/05/24 1635 07/07/24 0457 07/08/24 0440  Weight: 85 kg 81.6 kg 80.6 kg   Vitals:   07/07/24 2111 07/08/24 0535  BP: (!) 147/83 130/78  Pulse: 72 (!) 56  Resp: 18 16  Temp: 98.8 F (37.1 C) 98.2 F (36.8 C)  SpO2: 96% 98%   Examination: Physical Exam:  Constitutional: WN/WD obese  African-American male in no acute distress Respiratory: Diminished to auscultation bilaterally, no wheezing, rales, rhonchi or crackles. Normal respiratory effort and patient is not tachypenic. No accessory muscle use.  Unlabored breathing Cardiovascular: RRR, no murmurs / rubs / gallops. S1 and S2 auscultated. No extremity edema.  Abdomen: Soft, non-tender, distended secondary body habitus. Bowel sounds positive.  GU: Deferred. Musculoskeletal: No clubbing / cyanosis of digits/nails. No joint deformity upper and lower extremities.  Skin: No rashes, lesions, ulcers on limited skin. No induration; Warm and dry.  Neurologic: CN 2-12 grossly intact with no focal deficits. Romberg sign and cerebellar reflexes not assessed.  Psychiatric: Normal judgment and insight. Alert and oriented x 3. Normal mood and appropriate affect.   Condition at discharge: stable  The results of significant diagnostics from this hospitalization (including imaging, microbiology, ancillary and laboratory) are listed below for reference.   Imaging Studies: DG CHEST PORT 1 VIEW Result Date: 07/08/2024 CLINICAL DATA:  Shortness of breath. EXAM: PORTABLE CHEST 1 VIEW COMPARISON:  07/05/2024 FINDINGS: Low volume film. The cardio pericardial silhouette is enlarged. There is pulmonary vascular congestion without overt pulmonary edema. No substantial pleural effusion. Telemetry leads overlie the chest. IMPRESSION: Low volume film with vascular congestion. Electronically Signed   By: Camellia Candle M.D.   On: 07/08/2024 07:22   ECHOCARDIOGRAM COMPLETE Result Date:  07/06/2024    ECHOCARDIOGRAM REPORT   Patient Name:   Troy Singh. Date of Exam: 07/06/2024 Medical Rec #:  [id number]         Height:       63.0 in Accession #:    [id number]        Weight:       187.5 lb Date of Birth:  04/20/1971         BSA:          1.881 m Patient Age:    53 years          BP:           117/77 mmHg Patient Gender: M                 HR:           99 bpm. Exam Location:  Inpatient Procedure: 2D Echo, Cardiac Doppler and Color Doppler (Both Spectral and Color            Flow Doppler were utilized during procedure). Indications:    CHF-Acute Systolic 428.21 / I50.21  History:        Patient has prior history of Echocardiogram examinations, most                 recent 05/30/2023.  Sonographer:    Tinnie Gosling RDCS Referring Phys: [id number] JENNIFER YATES IMPRESSIONS  1. Left ventricular ejection fraction, by estimation, is 25 to 30%. The left ventricle has severely decreased function. The left ventricle demonstrates global hypokinesis. There is moderate concentric left ventricular hypertrophy. Left ventricular diastolic parameters are consistent with Grade II diastolic dysfunction (pseudonormalization).  2. Right ventricular systolic function is normal. The right ventricular size is normal. Moderately increased right ventricular wall thickness.  3. Left atrial size was severely dilated.  4. Right atrial size was moderately dilated.  5. The mitral valve is normal in structure. Trivial mitral valve regurgitation. No evidence of mitral stenosis.  6. The aortic valve is tricuspid. There is mild calcification of the aortic valve. Aortic valve regurgitation is not visualized. Aortic valve sclerosis/calcification is present, without any evidence of aortic stenosis.  7. The inferior vena cava is dilated in size with <50% respiratory variability, suggesting right atrial pressure of 15 mmHg. Conclusion(s)/Recommendation(s): There is biventricular hypertrophy. Consider cMRI for evalaution of infiltrative  process if clinically indicated. FINDINGS  Left Ventricle: Left ventricular ejection fraction, by estimation, is 25 to 30%. The left ventricle has severely decreased function. The left ventricle demonstrates global hypokinesis. The left ventricular internal cavity size was normal in size. There is moderate concentric left ventricular hypertrophy. Left ventricular diastolic parameters are consistent with Grade II diastolic dysfunction (pseudonormalization). Right Ventricle: The right ventricular size is normal. Moderately increased right ventricular wall thickness. Right ventricular systolic function is normal. Left Atrium: Left atrial size was severely dilated. Right Atrium: Right atrial size was moderately dilated. Pericardium: There is no evidence of pericardial effusion. Mitral Valve: The mitral valve is normal in structure. Trivial mitral valve regurgitation. No evidence of mitral valve stenosis. Tricuspid Valve: The tricuspid valve is normal in structure. Tricuspid valve regurgitation is trivial. No evidence of tricuspid stenosis. Aortic Valve: The aortic valve is tricuspid. There is mild calcification of the aortic valve. Aortic valve regurgitation is not visualized. Aortic valve sclerosis/calcification is present, without any evidence of aortic stenosis. Pulmonic Valve: The pulmonic valve was normal in structure. Pulmonic valve regurgitation is not visualized. No evidence of pulmonic stenosis. Aorta: The aortic root is normal in size and structure. Venous: The inferior vena cava is dilated in size with less than 50% respiratory variability, suggesting right atrial pressure of 15 mmHg. IAS/Shunts: No atrial level shunt detected by color flow Doppler.  LEFT VENTRICLE PLAX 2D LVIDd:         6.60 cm      Diastology LVIDs:         5.20 cm      LV e' medial:    5.44 cm/s LV PW:         1.30 cm      LV E/e' medial:  14.0 LV IVS:        1.30 cm      LV e' lateral:   2.94 cm/s LVOT diam:     2.30 cm      LV E/e'  lateral: 26.0 LV SV:         78 LV SV Index:   41 LVOT Area:     4.15 cm  LV Volumes (MOD) LV vol d, MOD A2C: 212.0 ml LV vol d, MOD A4C: 222.0 ml LV vol s, MOD A2C: 103.0 ml LV vol s, MOD A4C: 147.0 ml LV SV MOD A2C:     109.0 ml LV SV MOD A4C:     222.0 ml LV SV MOD BP:      97.0 ml RIGHT VENTRICLE             IVC RV S prime:     12.90 cm/s  IVC diam: 2.20 cm TAPSE (M-mode): 1.4 cm LEFT ATRIUM             Index        RIGHT ATRIUM           Index LA diam:        4.40 cm 2.34 cm/m   RA Area:     17.30 cm LA Vol (A2C):   83.4 ml 44.33 ml/m  RA Volume:   50.40 ml  26.79 ml/m LA Vol (A4C):   85.4 ml 45.39 ml/m LA Biplane Vol: 84.7 ml 45.02 ml/m  AORTIC VALVE LVOT Vmax:   112.00 cm/s  LVOT Vmean:  75.000 cm/s LVOT VTI:    0.187 m  AORTA Ao Root diam: 3.30 cm Ao Asc diam:  3.50 cm MITRAL VALVE MV Area (PHT): 2.93 cm    SHUNTS MV E velocity: 76.30 cm/s  Systemic VTI:  0.19 m MV A velocity: 63.00 cm/s  Systemic Diam: 2.30 cm MV E/A ratio:  1.21 Toribio Fuel MD Electronically signed by Toribio Fuel MD Signature Date/Time: 07/06/2024/12:06:01 PM    Final    DG Chest 2 View Result Date: 07/05/2024 CLINICAL DATA:  Shortness of breath.  Congestive heart failure. EXAM: CHEST - 2 VIEW COMPARISON:  05/29/2023 FINDINGS: Stable mild cardiomegaly. Central pulmonary vascular congestion is again noted, and mild interstitial prominence seen predominantly in the lower lungs zones is suspicious for mild interstitial edema. No evidence of pulmonary consolidation or pleural effusion. IMPRESSION: Cardiomegaly and pulmonary vascular congestion, with probable mild interstitial edema. Electronically Signed   By: Norleen DELENA Kil M.D.   On: 07/05/2024 10:24   Microbiology: Results for orders placed or performed during the hospital encounter of 05/29/23  MRSA Next Gen by PCR, Nasal     Status: None   Collection Time: 05/30/23  1:15 AM   Specimen: Nasal Mucosa; Nasal Swab  Result Value Ref Range Status   MRSA by PCR Next  Gen NOT DETECTED NOT DETECTED Final    Comment: (NOTE) The GeneXpert MRSA Assay (FDA approved for NASAL specimens only), is one component of a comprehensive MRSA colonization surveillance program. It is not intended to diagnose MRSA infection nor to guide or monitor treatment for MRSA infections. Test performance is not FDA approved in patients less than 68 years old. Performed at [facility], [address] [place], [place] [address]    Labs: CBC: Recent Labs  Lab 07/05/24 1006 07/06/24 0518 07/07/24 1023 07/08/24 0513  WBC 6.7 6.1 6.3 6.7  NEUTROABS  --   --  4.6 4.5  HGB 11.8* 11.2* 11.7* 11.5*  HCT 38.8* 36.1* 38.0* 37.7*  MCV 80.8 77.5* 78.0* 78.4*  PLT 304 278 295 273   Basic Metabolic Panel: Recent Labs  Lab 07/05/24 1006 07/05/24 2111 07/06/24 0518 07/07/24 0514 07/08/24 0513  NA 137 138 138 139 138  K 5.0 4.4 4.6 4.2 4.3  CL 104 103 101 102 101  CO2 23 26 23 25 25   GLUCOSE 97 114* 77 92 85  BUN 21* 25* 24* 30* 28*  CREATININE 1.49* 1.95* 1.70* 1.72* 1.59*  CALCIUM  9.5 9.5 9.7 9.5 9.6  MG  --   --   --  2.2 2.2  PHOS  --   --   --   --  4.0   Liver Function Tests: Recent Labs  Lab 07/08/24 0513  AST 21  ALT 16  ALKPHOS 123  BILITOT 0.7  PROT 6.9  ALBUMIN 3.4*   CBG: No results for input(s): GLUCAP in the last 168 hours.  Discharge time spent: greater than 30 minutes.  Signed: Alejandro Marker, DO [facility] 07/10/2024 "

## 2024-07-12 ENCOUNTER — Ambulatory Visit: Payer: Self-pay | Admitting: Family

## 2024-07-13 LAB — MULTIPLE MYELOMA PANEL, SERUM
Albumin SerPl Elph-Mcnc: 3.1 g/dL (ref 2.9–4.4)
Albumin/Glob SerPl: 1 (ref 0.7–1.7)
Alpha 1: 0.3 g/dL (ref 0.0–0.4)
Alpha2 Glob SerPl Elph-Mcnc: 0.7 g/dL (ref 0.4–1.0)
B-Globulin SerPl Elph-Mcnc: 1.1 g/dL (ref 0.7–1.3)
Gamma Glob SerPl Elph-Mcnc: 1.3 g/dL (ref 0.4–1.8)
Globulin, Total: 3.4 g/dL (ref 2.2–3.9)
IgA: 237 mg/dL (ref 90–386)
IgG (Immunoglobin G), Serum: 1366 mg/dL (ref 603–1613)
IgM (Immunoglobulin M), Srm: 87 mg/dL (ref 20–172)
Total Protein ELP: 6.5 g/dL (ref 6.0–8.5)

## 2024-07-19 ENCOUNTER — Telehealth (HOSPITAL_COMMUNITY): Payer: Self-pay

## 2024-07-19 NOTE — Telephone Encounter (Signed)
 Called to confirm/remind patient of their appointment [facility] on 07/20/24 11:30.   Appointment:   [] Confirmed  [x] Left mess   [] No answer/No voice mail  [] VM Full/unable to leave message  [] Phone not in service  Patient reminded to bring all medications and/or complete list.  Confirmed patient has transportation. Gave directions, instructed to utilize valet parking.

## 2024-07-19 NOTE — Progress Notes (Signed)
 "  ADVANCED HF CLINIC NOTE  Primary Care: Jaycee Greig PARAS, NP HF Cardiologist:  Dr. Zenaida  HPI: Troy Singh. Is a 54 y.o. male with hisotry of HFrEF, HTN, T2DM, CVAs, gout, CKD 3a, and tobacco use.  Presented 12/23 with hypertensive emergency and acute respiratory failure. Echo showed newly reduced EF to 35% with severe LVH. Underwent cath showing mild non obstructive CAD, elevated filling pressures, and preserved CO. PYP scan at that time was suggestive of amyloidosis.   He was last seen 1/24 in outpatient doing well, NYHA I-II on GDMT, however he has been lost to follow up since. Re-referred by Dr. Delford, [facility].   Admitted 11/24 with hypertensive emergency and respiratory failure. He had been off medications for a month and a half due to finances. He was diuresed and started back on GDMT. Reported that he would get insurance through his work.   Unfortunately, he was readmitted 12/25 with ADHF. Reports that he had been out of his medications for 6 months and was only taking norvasc . CMR was recommended in the outpatient, however patient is currently uninsured. He was diuresed and restarted on GDMT.   He returns today for post hospital follow up. Overall feeling well. Compliance has improved since hospitalization. NYHA II. Reports shortness of breath occasionally and with exertion. Denies chest pain, near-syncope, palpitations, and dizziness. Able to perform ADLs. Appetite okay. Has a scale at home, but forgets to use it often. Compliant with all medications, does have concerns about affording medications.   FH: HCM in his mother.   Past Medical History:  Diagnosis Date   Diabetes mellitus without complication (HCC)    Gout    Hypertension     Current Outpatient Medications  Medication Sig Dispense Refill   acetaminophen  (TYLENOL ) 500 MG tablet Take 1,000 mg by mouth every 6 (six) hours as needed for mild pain.     aspirin  EC 81 MG tablet Take 1 tablet (81 mg total) by  mouth daily. Swallow whole. 30 tablet 12   atorvastatin  (LIPITOR) 40 MG tablet Take 1 tablet (40 mg total) by mouth daily. 90 tablet 3   docusate sodium  (COLACE) 100 MG capsule Take 1 capsule (100 mg total) by mouth 2 (two) times daily. 10 capsule 0   furosemide  (LASIX ) 40 MG tablet Take 1 tablet (40 mg total) by mouth daily. 90 tablet 0   isosorbide -hydrALAZINE  (BIDIL ) 20-37.5 MG tablet Take 1 tablet by mouth 3 (three) times daily. 90 tablet 0   losartan  (COZAAR ) 50 MG tablet Take 1 tablet (50 mg total) by mouth daily. 30 tablet 0   metFORMIN  (GLUCOPHAGE -XR) 500 MG 24 hr tablet Take 1 tablet (500 mg total) by mouth daily with breakfast. 30 tablet 2   ondansetron  (ZOFRAN ) 4 MG tablet Take 1 tablet (4 mg total) by mouth every 6 (six) hours as needed for nausea. 20 tablet 0   polyethylene glycol (MIRALAX  / GLYCOLAX ) 17 g packet Take 17 g by mouth daily as needed for mild constipation. 14 each 0   spironolactone  (ALDACTONE ) 25 MG tablet Take 0.5 tablets (12.5 mg total) by mouth daily. 30 tablet 0   No current facility-administered medications for this encounter.    Allergies[1]    Social History   Socioeconomic History   Marital status: Single    Spouse name: Not on file   Number of children: Not on file   Years of education: Not on file   Highest education level: Not on file  Occupational History  Not on file  Tobacco Use   Smoking status: Former    Current packs/day: 0.00    Average packs/day: 1.0 packs/day    Types: Cigarettes    Quit date: 05/31/2021    Years since quitting: 3.1    Passive exposure: Past   Smokeless tobacco: Never  Vaping Use   Vaping status: Never Used  Substance and Sexual Activity   Alcohol use: No    Alcohol/week: 0.0 standard drinks of alcohol   Drug use: No   Sexual activity: Yes  Other Topics Concern   Not on file  Social History Narrative   Not on file   Social Drivers of Health   Tobacco Use: Medium Risk (07/20/2024)   Patient History     Smoking Tobacco Use: Former    Smokeless Tobacco Use: Never    Passive Exposure: Past  Physicist, Medical Strain: Medium Risk (07/16/2022)   Overall Financial Resource Strain (CARDIA)    Difficulty of Paying Living Expenses: Somewhat hard  Food Insecurity: No Food Insecurity (07/09/2024)   Epic    Worried About Radiation Protection Practitioner of Food in the Last Year: Never true    Ran Out of Food in the Last Year: Never true  Transportation Needs: No Transportation Needs (07/09/2024)   Epic    Lack of Transportation (Medical): No    Lack of Transportation (Non-Medical): No  Physical Activity: Not on file  Stress: Not on file  Social Connections: Not on file  Intimate Partner Violence: Not At Risk (07/09/2024)   Epic    Fear of Current or Ex-Partner: No    Emotionally Abused: No    Physically Abused: No    Sexually Abused: No  Depression (PHQ2-9): Low Risk (07/09/2024)   Depression (PHQ2-9)    PHQ-2 Score: 0  Alcohol Screen: Not on file  Housing: Unknown (07/09/2024)   Epic    Unable to Pay for Housing in the Last Year: No    Number of Times Moved in the Last Year: Not on file    Homeless in the Last Year: No  Utilities: Not At Risk (07/09/2024)   Epic    Threatened with loss of utilities: No  Health Literacy: Not on file      Family History  Problem Relation Age of Onset   Hypertrophic cardiomyopathy Mother    Diabetes Other    Hypertension Other     Vitals:   07/20/24 1058  BP: 131/77  Pulse: 64  SpO2: 98%  Weight: 84.1 kg (185 lb 6.4 oz)  Height: 5' 3 (1.6 m)    Filed Weights   07/20/24 1058  Weight: 84.1 kg (185 lb 6.4 oz)   PHYSICAL EXAM: General: Well appearing. No distress  Cardiac: JVP ~6cm. No murmurs  Abdomen: Soft, non-distended.  Extremities: Warm and dry.  No edema.  Neuro: A&O x3. Affect pleasant.   ASSESSMENT & PLAN:  Chronic HFrEF, NICM - Diagnosed 12/23 with EF 35% with mod LVH and G3DD. 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 Over read by Dr. Zenaida, negative for amyloid. Negative myeloma panel. Genetic CM/amyloid negative - CMR ordered for this week, however he is uninsured. - NYHA II. Weight up since discharge, does not appear significantly volume up, does drink a lot of fluids - Start lasix  40 mg daily. BMET/BNP, repeat at follow up - GDMT:  Vasodilators: Stop  bidil . Start hydral 25 mg tid + imdur  60 mg daily d/t cost  ARB/ARNi: continue losartan  50 mg daily  MRA: increase spirolactone 25 mg daily  SGLT2i: will need patient assistance/insurance  ? blocker: add next - Schedule 3 month echo once back on GDMT  HTN - BP controlled on GDMT above  CAD, HLD - mild non-obstructive on cath 12/23 (40% pLAD, 20% pRCA) - LDL 12/25: 100 - continue asa 81 mg daily + lipitor 40 mg daily   CKD 3b - baseline Cr 1.4-1.7 - BMET today  SDOH: - HF fund for medications; will need patient assistance for jardiance  - he is working on museum/gallery curator - will have SW follow up with him tomorrow  Follow up in 2 weeks with APP (medication titration/labs).   Nikkia Devoss, NP 07/20/2024     [1] No Known Allergies  "

## 2024-07-20 ENCOUNTER — Ambulatory Visit (HOSPITAL_COMMUNITY): Payer: Self-pay | Admitting: Cardiology

## 2024-07-20 ENCOUNTER — Encounter: Payer: Self-pay | Admitting: Family

## 2024-07-20 ENCOUNTER — Ambulatory Visit: Payer: Self-pay | Admitting: Family

## 2024-07-20 ENCOUNTER — Ambulatory Visit (HOSPITAL_COMMUNITY)
Admit: 2024-07-20 | Discharge: 2024-07-20 | Disposition: A | Discharge status: Home / Self Care | Payer: Self-pay | Source: Ambulatory Visit | Attending: Cardiology | Admitting: Cardiology

## 2024-07-20 ENCOUNTER — Other Ambulatory Visit (HOSPITAL_COMMUNITY): Payer: Self-pay

## 2024-07-20 ENCOUNTER — Encounter (HOSPITAL_COMMUNITY): Payer: Self-pay

## 2024-07-20 VITALS — BP 131/77 | HR 64 | Ht 63.0 in | Wt 185.4 lb

## 2024-07-20 VITALS — BP 136/80 | HR 74 | Temp 98.7°F | Resp 16 | Ht 63.0 in | Wt 185.2 lb

## 2024-07-20 DIAGNOSIS — Z0189 Encounter for other specified special examinations: Secondary | ICD-10-CM

## 2024-07-20 DIAGNOSIS — I5022 Chronic systolic (congestive) heart failure: Secondary | ICD-10-CM | POA: Insufficient documentation

## 2024-07-20 DIAGNOSIS — E7800 Pure hypercholesterolemia, unspecified: Secondary | ICD-10-CM

## 2024-07-20 DIAGNOSIS — M109 Gout, unspecified: Secondary | ICD-10-CM

## 2024-07-20 DIAGNOSIS — N1831 Chronic kidney disease, stage 3a: Secondary | ICD-10-CM

## 2024-07-20 DIAGNOSIS — D509 Iron deficiency anemia, unspecified: Secondary | ICD-10-CM

## 2024-07-20 DIAGNOSIS — I2510 Atherosclerotic heart disease of native coronary artery without angina pectoris: Secondary | ICD-10-CM

## 2024-07-20 DIAGNOSIS — Z09 Encounter for follow-up examination after completed treatment for conditions other than malignant neoplasm: Secondary | ICD-10-CM

## 2024-07-20 DIAGNOSIS — I5020 Unspecified systolic (congestive) heart failure: Secondary | ICD-10-CM

## 2024-07-20 DIAGNOSIS — E119 Type 2 diabetes mellitus without complications: Secondary | ICD-10-CM

## 2024-07-20 DIAGNOSIS — E1165 Type 2 diabetes mellitus with hyperglycemia: Secondary | ICD-10-CM

## 2024-07-20 DIAGNOSIS — I10 Essential (primary) hypertension: Secondary | ICD-10-CM

## 2024-07-20 DIAGNOSIS — E782 Mixed hyperlipidemia: Secondary | ICD-10-CM

## 2024-07-20 DIAGNOSIS — N179 Acute kidney failure, unspecified: Secondary | ICD-10-CM

## 2024-07-20 DIAGNOSIS — J9601 Acute respiratory failure with hypoxia: Secondary | ICD-10-CM

## 2024-07-20 DIAGNOSIS — I428 Other cardiomyopathies: Secondary | ICD-10-CM | POA: Insufficient documentation

## 2024-07-20 DIAGNOSIS — E1122 Type 2 diabetes mellitus with diabetic chronic kidney disease: Secondary | ICD-10-CM | POA: Insufficient documentation

## 2024-07-20 DIAGNOSIS — Z7984 Long term (current) use of oral hypoglycemic drugs: Secondary | ICD-10-CM | POA: Insufficient documentation

## 2024-07-20 DIAGNOSIS — Z5971 Insufficient health insurance coverage: Secondary | ICD-10-CM | POA: Insufficient documentation

## 2024-07-20 DIAGNOSIS — Z87891 Personal history of nicotine dependence: Secondary | ICD-10-CM | POA: Insufficient documentation

## 2024-07-20 DIAGNOSIS — Z79899 Other long term (current) drug therapy: Secondary | ICD-10-CM | POA: Insufficient documentation

## 2024-07-20 DIAGNOSIS — Z139 Encounter for screening, unspecified: Secondary | ICD-10-CM

## 2024-07-20 DIAGNOSIS — E785 Hyperlipidemia, unspecified: Secondary | ICD-10-CM | POA: Insufficient documentation

## 2024-07-20 DIAGNOSIS — N1832 Chronic kidney disease, stage 3b: Secondary | ICD-10-CM | POA: Insufficient documentation

## 2024-07-20 DIAGNOSIS — Z8673 Personal history of transient ischemic attack (TIA), and cerebral infarction without residual deficits: Secondary | ICD-10-CM | POA: Insufficient documentation

## 2024-07-20 DIAGNOSIS — E66811 Obesity, class 1: Secondary | ICD-10-CM

## 2024-07-20 DIAGNOSIS — I5043 Acute on chronic combined systolic (congestive) and diastolic (congestive) heart failure: Secondary | ICD-10-CM

## 2024-07-20 DIAGNOSIS — R635 Abnormal weight gain: Secondary | ICD-10-CM

## 2024-07-20 DIAGNOSIS — Z7982 Long term (current) use of aspirin: Secondary | ICD-10-CM | POA: Insufficient documentation

## 2024-07-20 DIAGNOSIS — E8809 Other disorders of plasma-protein metabolism, not elsewhere classified: Secondary | ICD-10-CM

## 2024-07-20 DIAGNOSIS — I130 Hypertensive heart and chronic kidney disease with heart failure and stage 1 through stage 4 chronic kidney disease, or unspecified chronic kidney disease: Secondary | ICD-10-CM | POA: Insufficient documentation

## 2024-07-20 LAB — BASIC METABOLIC PANEL WITH GFR
Anion gap: 10 (ref 5–15)
BUN: 23 mg/dL — ABNORMAL HIGH (ref 6–20)
CO2: 26 mmol/L (ref 22–32)
Calcium: 10 mg/dL (ref 8.9–10.3)
Chloride: 105 mmol/L (ref 98–111)
Creatinine, Ser: 1.33 mg/dL — ABNORMAL HIGH (ref 0.61–1.24)
GFR, Estimated: >60 mL/min
Glucose, Bld: 81 mg/dL (ref 70–99)
Potassium: 4.3 mmol/L (ref 3.5–5.1)
Sodium: 142 mmol/L (ref 135–145)

## 2024-07-20 LAB — PRO BRAIN NATRIURETIC PEPTIDE: Pro Brain Natriuretic Peptide: 2564 pg/mL — ABNORMAL HIGH

## 2024-07-20 MED ORDER — ISOSORBIDE MONONITRATE ER 60 MG PO TB24
60.0000 mg | ORAL_TABLET | Freq: Every day | ORAL | 3 refills | Status: AC
  Filled 2024-07-20: qty 30, 30d supply, fill #0
  Filled 2024-08-06 – 2024-08-17 (×2): qty 30, 30d supply, fill #1

## 2024-07-20 MED ORDER — HYDRALAZINE HCL 25 MG PO TABS
25.0000 mg | ORAL_TABLET | Freq: Three times a day (TID) | ORAL | 3 refills | Status: AC
  Filled 2024-07-20: qty 90, 30d supply, fill #0
  Filled 2024-08-06 – 2024-08-17 (×2): qty 90, 30d supply, fill #1

## 2024-07-20 MED ORDER — FUROSEMIDE 40 MG PO TABS
40.0000 mg | ORAL_TABLET | Freq: Every day | ORAL | 3 refills | Status: AC
  Filled 2024-07-20: qty 30, 30d supply, fill #0
  Filled 2024-08-06 – 2024-08-17 (×2): qty 30, 30d supply, fill #1

## 2024-07-20 MED ORDER — SPIRONOLACTONE 25 MG PO TABS
25.0000 mg | ORAL_TABLET | Freq: Every day | ORAL | 3 refills | Status: AC
  Filled 2024-07-20: qty 30, 30d supply, fill #0
  Filled 2024-08-06 – 2024-08-17 (×2): qty 30, 30d supply, fill #1

## 2024-07-20 NOTE — Progress Notes (Signed)
Hospital follow up,

## 2024-07-20 NOTE — Progress Notes (Signed)
 "   Patient ID: Troy Singh., male    DOB: 01-21-1971  MRN: [id number]  CC: Hospital Discharge Follow-Up  Subjective: Troy Singh is a 54 y.o. male who presents for hospital discharge follow-up.   His concerns today include:  Patient seen on 07/05/2024 - 07/08/2024 (3 days) at [facility] for acute on chronic combined systolic (congestive) and diastolic (congestive) heart failure (HCC) and additional diagnoses. Today patient reports feeling improved and denies red flag symptoms. He is doing well on medication regimen, no issues/concerns. Established with Cardiology and had an office visit with the same earlier today.   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   NSVT (nonsustained ventricular tachycardia) (HCC) 06/23/2022   Fever 06/23/2022   Acute combined systolic and diastolic heart failure (HCC) 06/22/2022   Coronary artery disease due to lipid rich plaque 06/22/2022   Pure hypercholesterolemia 06/22/2022   AKI (acute kidney injury) 06/22/2022   CAP (community acquired pneumonia) 06/22/2022   At risk for sleep apnea 06/22/2022   Gout 06/22/2022   Morbid obesity (HCC) 06/22/2022   DCM (dilated cardiomyopathy) (HCC) 06/21/2022   Hypertensive emergency 06/20/2022   Elevated troponin 06/20/2022   Diet-controlled diabetes mellitus (HCC) 06/20/2022   Acute congestive heart failure (HCC) 06/20/2022     Medications Ordered Prior to Encounter[1]  Allergies[2]  Social History   Socioeconomic History   Marital status: Single    Spouse name: Not on file   Number of children: Not on file   Years of education: Not on file   Highest  education level: Not on file  Occupational History   Not on file  Tobacco Use   Smoking status: Former    Current packs/day: 0.00    Average packs/day: 1.0 packs/day    Types: Cigarettes    Quit date: 05/31/2021    Years since quitting: 3.1    Passive exposure: Past   Smokeless tobacco: Never  Vaping Use   Vaping status: Never Used  Substance and Sexual Activity   Alcohol use: No    Alcohol/week: 0.0 standard drinks of alcohol   Drug use: No   Sexual activity: Yes  Other Topics Concern   Not on file  Social History Narrative   Not on file   Social Drivers of Health   Tobacco Use: Medium Risk (07/20/2024)   Patient History    Smoking Tobacco Use: Former    Smokeless Tobacco Use: Never    Passive Exposure: Past  Physicist, Medical Strain: Medium Risk (07/16/2022)   Overall Financial Resource Strain (CARDIA)    Difficulty of Paying Living Expenses: Somewhat hard  Food Insecurity: No Food Insecurity (07/09/2024)   Epic    Worried About Programme Researcher, Broadcasting/film/video in the Last Year: Never true    Ran Out of Food in the Last Year: Never true  Transportation Needs: No Transportation Needs (07/09/2024)   Epic    Lack of Transportation (Medical): No    Lack of Transportation (Non-Medical): No  Physical Activity: Inactive (07/20/2024)   Exercise Vital Sign    Days of Exercise per Week: 0 days    Minutes of Exercise per Session: 0 min  Stress: No  Stress Concern Present (07/20/2024)   [employer] of Occupational Health - Occupational Stress Questionnaire    Feeling of Stress: Only a little  Social Connections: Moderately Isolated (07/20/2024)   Social Connection and Isolation Panel    Frequency of Communication with Friends and Family: More than three times a week    Frequency of Social Gatherings with Friends and Family: More than three times a week    Attends Religious Services: 1 to 4 times per year    Active Member of Clubs or Organizations: No    Attends Banker  Meetings: Never    Marital Status: Separated  Intimate Partner Violence: Not At Risk (07/09/2024)   Epic    Fear of Current or Ex-Partner: No    Emotionally Abused: No    Physically Abused: No    Sexually Abused: No  Depression (PHQ2-9): Low Risk (07/20/2024)   Depression (PHQ2-9)    PHQ-2 Score: 0  Alcohol Screen: Low Risk (07/20/2024)   Alcohol Screen    Last Alcohol Screening Score (AUDIT): 0  Housing: Unknown (07/09/2024)   Epic    Unable to Pay for Housing in the Last Year: No    Number of Times Moved in the Last Year: Not on file    Homeless in the Last Year: No  Utilities: Not At Risk (07/09/2024)   Epic    Threatened with loss of utilities: No  Health Literacy: Adequate Health Literacy (07/20/2024)   B1300 Health Literacy    Frequency of need for help with medical instructions: Never    Family History  Problem Relation Age of Onset   Hypertrophic cardiomyopathy Mother    Diabetes Other    Hypertension Other     Past Surgical History:  Procedure Laterality Date   PENECTOMY     RIGHT/LEFT HEART CATH AND CORONARY ANGIOGRAPHY N/A 06/21/2022   Procedure: RIGHT/LEFT HEART CATH AND CORONARY ANGIOGRAPHY;  Surgeon: Verlin Lonni BIRCH, MD;  Location: [facility] INVASIVE CV LAB;  Service: Cardiovascular;  Laterality: N/A;    ROS: Review of Systems Negative except as stated above  PHYSICAL EXAM: BP 136/80   Pulse 74   Temp 98.7 F (37.1 C) (Oral)   Resp 16   Ht 5' 3 (1.6 m)   Wt 185 lb 3.2 oz (84 kg)   SpO2 98%   BMI 32.81 kg/m   Physical Exam HENT:     Head: Normocephalic and atraumatic.     Nose: Nose normal.     Mouth/Throat:     Mouth: Mucous membranes are moist.     Pharynx: Oropharynx is clear.  Eyes:     Extraocular Movements: Extraocular movements intact.     Conjunctiva/sclera: Conjunctivae normal.     Pupils: Pupils are equal, round, and reactive to light.  Cardiovascular:     Rate and Rhythm: Normal rate and regular rhythm.     Pulses: Normal  pulses.     Heart sounds: Normal heart sounds.  Pulmonary:     Effort: Pulmonary effort is normal.     Breath sounds: Normal breath sounds.  Musculoskeletal:        General: Normal range of motion.     Cervical back: Normal range of motion and neck supple.  Neurological:     General: No focal deficit present.     Mental Status: He is alert and oriented to person, place, and time.  Psychiatric:        Mood and Affect: Mood normal.  Behavior: Behavior normal.     ASSESSMENT AND PLAN: 1. Hospital discharge follow-up (Primary) - Reviewed hospital course, current medications, ensured proper follow-up in place, and addressed concerns.  - Routine screening.  - CBC - CMP14+EGFR - Magnesium  - Phosphorus  2. Acute on chronic combined systolic (congestive) and diastolic (congestive) heart failure (HCC) 3. Acute respiratory failure with hypoxia (HCC) 4. Pure hypercholesterolemia 5. Uncontrolled hypertension 6. Coronary artery disease, unspecified vessel or lesion type, unspecified whether angina present, unspecified whether native or transplanted heart - Continue present management.  - Keep all scheduled appointments with established Cardiology.   7. Type 2 diabetes mellitus with hyperglycemia, without long-term current use of insulin  (HCC) - Hemoglobin A1c 6.2% on 07/06/2024. - Diet controlled.  - Routine screening.  - Discussed the importance of healthy eating habits, low-carbohydrate diet, low-sugar diet, and regular aerobic exercise (at least 150 minutes a week as tolerated) to achieve or maintain control of diabetes. - Follow-up with primary provider in March 2026 or sooner if needed.  - Microalbumin / creatinine urine ratio  8. Diabetic eye exam [facility]) - Referral to Ophthalmology for evaluation/management. - Ambulatory referral to Ophthalmology  9. Encounter for diabetic foot exam [facility]) - Referral to Podiatry for evaluation/management.  - Ambulatory referral to  Podiatry  10. AKI (acute kidney injury) 11. Stage 3a chronic kidney disease (HCC) - Referral to Nephrology for evaluation/management. - Ambulatory referral to Nephrology  12. Gout, unspecified cause, unspecified chronicity, unspecified site - Resolved.  13. Microcytic anemia 14. Hypoalbuminemia - Routine screening (also see #1). - Albumin  15. Weight gain 16. Class 1 obesity - Patient declined referral to Medical Weight Management.  Patient was given the opportunity to ask questions.  Patient verbalized understanding of the plan and was able to repeat key elements of the plan. Patient was given clear instructions to go to Emergency Department or return to medical center if symptoms don't improve, worsen, or new problems develop.The patient verbalized understanding.   Orders Placed This Encounter  Procedures   CBC   CMP14+EGFR   Magnesium    Phosphorus   Microalbumin / creatinine urine ratio   Albumin   Ambulatory referral to Podiatry   Ambulatory referral to Ophthalmology   Ambulatory referral to Nephrology   Return for Follow-up as needed.  Greig JINNY Chute, NP      [1]  Current Outpatient Medications on File Prior to Visit  Medication Sig Dispense Refill   acetaminophen  (TYLENOL ) 500 MG tablet Take 1,000 mg by mouth every 6 (six) hours as needed for mild pain.     aspirin  EC 81 MG tablet Take 1 tablet (81 mg total) by mouth daily. Swallow whole. 30 tablet 12   atorvastatin  (LIPITOR) 40 MG tablet Take 1 tablet (40 mg total) by mouth daily. 90 tablet 3   docusate sodium  (COLACE) 100 MG capsule Take 1 capsule (100 mg total) by mouth 2 (two) times daily. 10 capsule 0   furosemide  (LASIX ) 40 MG tablet Take 1 tablet (40 mg total) by mouth daily. 90 tablet 3   hydrALAZINE  (APRESOLINE ) 25 MG tablet Take 1 tablet (25 mg total) by mouth 3 (three) times daily. 270 tablet 3   isosorbide  mononitrate (IMDUR ) 60 MG 24 hr tablet Take 1 tablet (60 mg total) by mouth daily. 90 tablet 3    losartan  (COZAAR ) 50 MG tablet Take 1 tablet (50 mg total) by mouth daily. 30 tablet 0   metFORMIN  (GLUCOPHAGE -XR) 500 MG 24 hr tablet Take 1 tablet (500 mg  total) by mouth daily with breakfast. 30 tablet 2   ondansetron  (ZOFRAN ) 4 MG tablet Take 1 tablet (4 mg total) by mouth every 6 (six) hours as needed for nausea. 20 tablet 0   polyethylene glycol (MIRALAX  / GLYCOLAX ) 17 g packet Take 17 g by mouth daily as needed for mild constipation. 14 each 0   spironolactone  (ALDACTONE ) 25 MG tablet Take 1 tablet (25 mg total) by mouth daily. 90 tablet 3   No current facility-administered medications on file prior to visit.  [2] No Known Allergies  "

## 2024-07-20 NOTE — Patient Instructions (Signed)
 Medication Changes:  INCREASE SPIRONOLACTONE  TO 25MG  ONCE DAILY   STOP TAKING BIDIL    START TAKING HYDRALAZINE  25MG  THREE TIMES DAILY,  START TAKING ISOSORBIDE  60MG  ONCE DAILY   START FUROSEMIDE  40MG  ONCE DAILY   Lab Work:  Labs done today, your results will be available in MyChart, we will contact you for abnormal readings.  Follow-Up in: 2 WEEKS AS SCHEDULED WITH APP CLINIC   At the [facility], you and your health needs are our priority. We have a designated team specialized in the treatment of Heart Failure. This Care Team includes your primary Heart Failure Specialized Cardiologist (physician), Advanced Practice Providers (APPs- Physician Assistants and Nurse Practitioners), and Pharmacist who all work together to provide you with the care you need, when you need it.   You may see any of the following providers on your designated Care Team at your next follow up:  Dr. Toribio Fuel Dr. Ezra Shuck Dr. Odis Brownie Greig Mosses, NP Caffie Shed, [place] [facility] [place], [place] Beckey Coe, NP Jordan Lee, NP Tinnie Redman, PharmD   Please be sure to bring in all your medications bottles to every appointment.   Need to Contact Us :  If you have any questions or concerns before your next appointment please send us  a message through [place] or call our office at [phone].    TO LEAVE A MESSAGE FOR THE NURSE SELECT OPTION 2, PLEASE LEAVE A MESSAGE INCLUDING: YOUR NAME DATE OF BIRTH CALL BACK NUMBER REASON FOR CALL**this is important as we prioritize the call backs  YOU WILL RECEIVE A CALL BACK THE SAME DAY AS LONG AS YOU CALL BEFORE 4:00 PM

## 2024-07-21 ENCOUNTER — Encounter (HOSPITAL_COMMUNITY): Payer: Self-pay

## 2024-07-21 ENCOUNTER — Ambulatory Visit: Payer: Self-pay | Admitting: Family

## 2024-07-21 LAB — CMP14+EGFR
ALT: 22 IU/L (ref 0–44)
AST: 26 IU/L (ref 0–40)
Albumin: 4 g/dL (ref 3.8–4.9)
Alkaline Phosphatase: 115 IU/L (ref 47–123)
BUN/Creatinine Ratio: 16 (ref 9–20)
BUN: 21 mg/dL (ref 6–24)
Bilirubin Total: 0.4 mg/dL (ref 0.0–1.2)
CO2: 25 mmol/L (ref 20–29)
Calcium: 9.7 mg/dL (ref 8.7–10.2)
Chloride: 104 mmol/L (ref 96–106)
Creatinine, Ser: 1.29 mg/dL — ABNORMAL HIGH (ref 0.76–1.27)
Globulin, Total: 3.1 g/dL (ref 1.5–4.5)
Glucose: 77 mg/dL (ref 70–99)
Potassium: 4.4 mmol/L (ref 3.5–5.2)
Sodium: 139 mmol/L (ref 134–144)
Total Protein: 7.1 g/dL (ref 6.0–8.5)
eGFR: 66 mL/min/1.73

## 2024-07-21 LAB — CBC
Hematocrit: 38.9 % (ref 37.5–51.0)
Hemoglobin: 11.5 g/dL — ABNORMAL LOW (ref 13.0–17.7)
MCH: 24 pg — ABNORMAL LOW (ref 26.6–33.0)
MCHC: 29.6 g/dL — ABNORMAL LOW (ref 31.5–35.7)
MCV: 81 fL (ref 79–97)
Platelets: 367 x10E3/uL (ref 150–450)
RBC: 4.79 x10E6/uL (ref 4.14–5.80)
RDW: 16.9 % — ABNORMAL HIGH (ref 11.6–15.4)
WBC: 5 x10E3/uL (ref 3.4–10.8)

## 2024-07-21 LAB — MICROALBUMIN / CREATININE URINE RATIO
Creatinine, Urine: 63.4 mg/dL
Microalb/Creat Ratio: 59 mg/g{creat} — ABNORMAL HIGH (ref 0–29)
Microalbumin, Urine: 37.2 ug/mL

## 2024-07-21 LAB — PHOSPHORUS: Phosphorus: 3.9 mg/dL (ref 2.8–4.1)

## 2024-07-21 LAB — MAGNESIUM: Magnesium: 1.8 mg/dL (ref 1.6–2.3)

## 2024-07-22 ENCOUNTER — Telehealth (HOSPITAL_COMMUNITY): Payer: Self-pay | Admitting: Licensed Clinical Social Worker

## 2024-07-22 NOTE — Telephone Encounter (Signed)
 CSW attempted to call pt regarding loss of insurance- unable to reach- left VM requesting return call  Andriette HILARIO Leech, LCSW Clinical Social Worker [facility] Desk#: [phone] Cell#: [phone]

## 2024-07-23 ENCOUNTER — Inpatient Hospital Stay (HOSPITAL_COMMUNITY)
Admission: RE | Admit: 2024-07-23 | Discharge: 2024-07-23 | Disposition: A | Payer: Self-pay | Attending: Cardiovascular Disease | Admitting: Cardiovascular Disease

## 2024-07-23 ENCOUNTER — Telehealth (HOSPITAL_COMMUNITY): Payer: Self-pay

## 2024-07-23 ENCOUNTER — Other Ambulatory Visit: Payer: Self-pay | Admitting: Cardiovascular Disease

## 2024-07-23 DIAGNOSIS — I5043 Acute on chronic combined systolic (congestive) and diastolic (congestive) heart failure: Secondary | ICD-10-CM | POA: Insufficient documentation

## 2024-07-23 MED ORDER — GADOBUTROL 1 MMOL/ML IV SOLN
12.0000 mL | Freq: Once | INTRAVENOUS | Status: AC | PRN
  Administered 2024-07-23: 12 mL via INTRAVENOUS

## 2024-07-23 NOTE — Telephone Encounter (Signed)
 Advanced [facility] Patient Advocate Encounter  This patient may be eligible for patient assistance for Jardiance . Need to obtain household and income information and consent to apply. Left voicemail for patient.  Rachel DEL, CPhT Rx Patient Advocate Phone: [phone]

## 2024-07-26 ENCOUNTER — Ambulatory Visit: Payer: Self-pay | Admitting: Cardiovascular Disease

## 2024-07-26 NOTE — Telephone Encounter (Signed)
 Call to patient to discuss cardiac MRI results, no answer. No updated DPR on file for our practice. Left VM asking recipient to call [place] at our office #. [facility] sent.

## 2024-07-26 NOTE — Telephone Encounter (Signed)
-----   Message from Wilbert Bihari, MD sent at 07/26/2024  2:47 PM EST ----- I have not seen him in over 2 years and he was being followed by Dr. Gardenia with AHF.  Please get him in ASAp with Dr. Odis Dustman ----- Message ----- From: Delford Maude BROCKS, MD Sent: 07/26/2024   2:42 PM EST To: Wilbert JONELLE Bihari, MD; [facility] [place] Triage  Needs PET CT to r/o Sarcoid as cause of week heart muscle Needs f/u as new patient in CHF clinic this is Dr Robie patient

## 2024-08-02 NOTE — Telephone Encounter (Signed)
 Spoke with patient by phone, confirmed household and income information, obtained consent to apply. Application for [employer] C[employer] submitted electronically 08/02/2024.

## 2024-08-03 NOTE — Telephone Encounter (Signed)
 Call to patient to discuss results and treatment plan, no answer. No updated DPR on file for our practice. Left VM asking recipient to call [place] at our office #.

## 2024-08-03 NOTE — Telephone Encounter (Signed)
 Patient was approved to receive Jardince from [facility] Effective 08/03/2024 to 08/02/2025

## 2024-08-03 NOTE — Telephone Encounter (Signed)
-----   Message from Wilbert Bihari, MD sent at 07/26/2024  2:47 PM EST ----- I have not seen him in over 2 years and he was being followed by Dr. Gardenia with AHF.  Please get him in ASAp with Dr. Odis Dustman ----- Message ----- From: Delford Maude BROCKS, MD Sent: 07/26/2024   2:42 PM EST To: Wilbert JONELLE Bihari, MD; [facility] [place] Triage  Needs PET CT to r/o Sarcoid as cause of week heart muscle Needs f/u as new patient in CHF clinic this is Dr Robie patient

## 2024-08-04 ENCOUNTER — Telehealth (HOSPITAL_COMMUNITY): Payer: Self-pay | Admitting: Licensed Clinical Social Worker

## 2024-08-04 NOTE — Telephone Encounter (Signed)
 CSW attempted to call patient to discuss possible insurance options- unable to reach- left VM requesting return call  Troy HILARIO Leech, LCSW Clinical Social Worker [facility] Desk#: [phone] Cell#: [phone]

## 2024-08-04 NOTE — Progress Notes (Signed)
 "  ADVANCED HF CLINIC NOTE  Primary Care: Jaycee Greig PARAS, NP Primary Cardiologist: Dr. Shlomo HF Cardiologist:  Dr. Zenaida  HPI: Troy Singh. Is a 54 y.o. male with hisotry of HFrEF, HTN, T2DM, CVAs, gout, CKD 3a, and tobacco use.  Presented 12/23 with hypertensive emergency and acute respiratory failure. Echo showed newly reduced EF to 35% with severe LVH. Underwent cath showing mild non obstructive CAD, elevated filling pressures, and preserved CO. PYP scan at that time was suggestive of amyloidosis.   Lost to follow up in AHF clinic 07/2022.  Re-referred by Dr. Delford, [facility].   Admitted 11/24 with hypertensive emergency and respiratory failure. He had been off medications for a month and a half due to finances. He was diuresed and started back on GDMT. Reported that he would get insurance through his work.   Unfortunately, he was readmitted 12/25 with ADHF. Reports that he had been out of his medications for 6 months and was only taking norvasc . Echo showed EF 25-30%, moderate LVH, G2DD, RV ok. CMR was recommended outpatient, however patient uninsured. He was diuresed and restarted on GDMT.   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  Today he returns for HF follow up. Overall feeling fine. No SOB with activity. Denies palpitations, abnormal bleeding, CP, dizziness, edema, or PND/Orthopnea. Appetite ok.  Taking all medications. Works in [employer]. No tobacco, ETOH or drug use.    FH: mother with HCM   Past Medical History:  Diagnosis Date   Diabetes mellitus without complication (HCC)    Gout    Hypertension    Current Outpatient Medications  Medication Sig Dispense Refill   acetaminophen  (TYLENOL ) 500 MG tablet Take 1,000 mg by mouth every 6 (six) hours as  needed for mild pain.     aspirin  EC 81 MG tablet Take 1 tablet (81 mg total) by mouth daily. Swallow whole. 30 tablet 12   atorvastatin  (LIPITOR) 40 MG tablet Take 1 tablet (40 mg total) by mouth daily. 90 tablet 3   docusate sodium  (COLACE) 100 MG capsule Take 1 capsule (100 mg total) by mouth 2 (two) times daily. 10 capsule 0   furosemide  (LASIX ) 40 MG tablet Take 1 tablet (40 mg total) by mouth daily. 90 tablet 3   hydrALAZINE  (APRESOLINE ) 25 MG tablet Take 1 tablet (25 mg total) by mouth 3 (three) times daily. 270 tablet 3   isosorbide  mononitrate (IMDUR ) 60 MG 24 hr tablet Take 1 tablet (60 mg total) by mouth daily. 90 tablet 3   losartan  (COZAAR ) 50 MG tablet Take 1 tablet (50 mg total) by mouth daily. 30 tablet 0   metFORMIN  (GLUCOPHAGE -XR) 500 MG 24 hr tablet Take 1 tablet (500 mg total) by mouth daily with breakfast. 30 tablet 2   ondansetron  (ZOFRAN ) 4 MG tablet Take 1 tablet (4 mg total) by mouth every 6 (six) hours as needed for nausea. 20 tablet 0   polyethylene glycol (MIRALAX  / GLYCOLAX ) 17 g packet Take 17 g by mouth daily as needed for mild constipation. 14 each 0   spironolactone  (ALDACTONE ) 25 MG tablet Take 1 tablet (25 mg total) by mouth daily. 90 tablet 3   No current facility-administered medications for this encounter.   Allergies[1]  Social History   Socioeconomic History   Marital  status: Single    Spouse name: Not on file   Number of children: Not on file   Years of education: Not on file   Highest education level: Not on file  Occupational History   Not on file  Tobacco Use   Smoking status: Former    Current packs/day: 0.00    Average packs/day: 1.0 packs/day    Types: Cigarettes    Quit date: 05/31/2021    Years since quitting: 3.1    Passive exposure: Past   Smokeless tobacco: Never  Vaping Use   Vaping status: Never Used  Substance and Sexual Activity   Alcohol use: No    Alcohol/week: 0.0 standard drinks of alcohol   Drug use: No   Sexual  activity: Yes  Other Topics Concern   Not on file  Social History Narrative   Not on file   Social Drivers of Health   Tobacco Use: Medium Risk (08/06/2024)   Patient History    Smoking Tobacco Use: Former    Smokeless Tobacco Use: Never    Passive Exposure: Past  Physicist, Medical Strain: Medium Risk (07/16/2022)   Overall Financial Resource Strain (CARDIA)    Difficulty of Paying Living Expenses: Somewhat hard  Food Insecurity: No Food Insecurity (07/09/2024)   Epic    Worried About Radiation Protection Practitioner of Food in the Last Year: Never true    Ran Out of Food in the Last Year: Never true  Transportation Needs: No Transportation Needs (07/09/2024)   Epic    Lack of Transportation (Medical): No    Lack of Transportation (Non-Medical): No  Physical Activity: Inactive (07/20/2024)   Exercise Vital Sign    Days of Exercise per Week: 0 days    Minutes of Exercise per Session: 0 min  Stress: No Stress Concern Present (07/20/2024)   [employer] of Occupational Health - Occupational Stress Questionnaire    Feeling of Stress: Only a little  Social Connections: Moderately Isolated (07/20/2024)   Social Connection and Isolation Panel    Frequency of Communication with Friends and Family: More than three times a week    Frequency of Social Gatherings with Friends and Family: More than three times a week    Attends Religious Services: 1 to 4 times per year    Active Member of Clubs or Organizations: No    Attends Banker Meetings: Never    Marital Status: Separated  Intimate Partner Violence: Not At Risk (07/09/2024)   Epic    Fear of Current or Ex-Partner: No    Emotionally Abused: No    Physically Abused: No    Sexually Abused: No  Depression (PHQ2-9): Low Risk (07/20/2024)   Depression (PHQ2-9)    PHQ-2 Score: 0  Alcohol Screen: Low Risk (07/20/2024)   Alcohol Screen    Last Alcohol Screening Score (AUDIT): 0  Housing: Unknown (07/09/2024)   Epic    Unable to Pay for Housing  in the Last Year: No    Number of Times Moved in the Last Year: Not on file    Homeless in the Last Year: No  Utilities: Not At Risk (07/09/2024)   Epic    Threatened with loss of utilities: No  Health Literacy: Adequate Health Literacy (07/20/2024)   B1300 Health Literacy    Frequency of need for help with medical instructions: Never   Family History  Problem Relation Age of Onset   Hypertrophic cardiomyopathy Mother    Diabetes Other    Hypertension Other  Wt Readings from Last 3 Encounters:  08/06/24 85.7 kg (189 lb)  07/20/24 84.1 kg (185 lb 6.4 oz)  07/20/24 84 kg (185 lb 3.2 oz)   BP (!) 178/82   Pulse 72   Ht 5' 3 (1.6 m)   Wt 85.7 kg (189 lb)   SpO2 97%   BMI 33.48 kg/m   PHYSICAL EXAM: General:  NAD. No resp difficulty, walked into clinic HEENT: Normal Neck: Supple. No JVD. Cor: Regular rate & rhythm. No rubs, gallops or murmurs. Lungs: Clear Abdomen: Soft, nontender, nondistended.  Extremities: No cyanosis, clubbing, rash, edema Neuro: Alert & oriented x 3, moves all 4 extremities w/o difficulty. Affect pleasant.  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  ASSESSMENT & PLAN:  Chronic Systolic Heart Failure, NICM - Diagnosed 12/23 with EF 35% with mod LVH and G3DD. Mother with history of HCM - Cath 12/23: NICM, nonobstructive CAD - PYP scan 12/23: grade 2, suggestive of TTR amyloidosis. Over read by Dr. Zenaida, negative for amyloid. Negative myeloma panel.  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 Variations in this gene can be associated with HCM. Discuss referral to genetic counselor next visit. 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 - NYHA II. Volume up a bit, ReDs 42% (suspect body habitus contributing) - Start Jardiance  10 mg daily. - Continue Lasix  40 mg daily. - Continue losartan  50 mg daily (uninsured so cannot get Entresto ) - Continue spironolactone  25 mg daily - Continue hydralazine  25 mg tid + imdur  60 mg daily. 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  HTN - BP elevated today, has not had afternoon dose of hydralazine  - GDMT as above - I asked him to purchase a BP cuff for home checks  CAD - mild non-obstructive on cath 12/23 (40% pLAD, 20% pRCA) - LDL 12/25: 100 - No chest pain - Continue asa 81 mg daily + lipitor 40 mg daily   CKD 3b - baseline Cr 1.4-1.7 - Start Jardiance  10 mg daily. - BMET today  SDOH - uninsured, he is employed and hopes to have insurance in the near-future - [facility] helping with resources  Keep follow up next month with Dr. Zenaida + echo, as scheduled  Harlene CHRISTELLA Gainer, FNP 08/06/24     [1] No Known Allergies  "

## 2024-08-05 ENCOUNTER — Telehealth (HOSPITAL_COMMUNITY): Payer: Self-pay

## 2024-08-05 NOTE — Telephone Encounter (Signed)
 Called to confirm/remind patient of their appointment at the [facility] on 08/06/24.   Appointment:   [] Confirmed  [x] Left mess   [] No answer/No voice mail  [] VM Full/unable to leave message  [] Phone not in service  And to bring in all medications and/or complete list.

## 2024-08-06 ENCOUNTER — Encounter (HOSPITAL_COMMUNITY): Payer: Self-pay

## 2024-08-06 ENCOUNTER — Other Ambulatory Visit (HOSPITAL_COMMUNITY): Payer: Self-pay

## 2024-08-06 ENCOUNTER — Ambulatory Visit (HOSPITAL_COMMUNITY)
Admission: RE | Admit: 2024-08-06 | Discharge: 2024-08-06 | Disposition: A | Payer: Self-pay | Source: Ambulatory Visit | Attending: Family Medicine

## 2024-08-06 VITALS — BP 178/82 | HR 72 | Ht 63.0 in | Wt 189.0 lb

## 2024-08-06 DIAGNOSIS — Z87891 Personal history of nicotine dependence: Secondary | ICD-10-CM | POA: Insufficient documentation

## 2024-08-06 DIAGNOSIS — Z79899 Other long term (current) drug therapy: Secondary | ICD-10-CM | POA: Insufficient documentation

## 2024-08-06 DIAGNOSIS — E1122 Type 2 diabetes mellitus with diabetic chronic kidney disease: Secondary | ICD-10-CM | POA: Insufficient documentation

## 2024-08-06 DIAGNOSIS — I10 Essential (primary) hypertension: Secondary | ICD-10-CM

## 2024-08-06 DIAGNOSIS — I5022 Chronic systolic (congestive) heart failure: Secondary | ICD-10-CM | POA: Insufficient documentation

## 2024-08-06 DIAGNOSIS — I428 Other cardiomyopathies: Secondary | ICD-10-CM | POA: Insufficient documentation

## 2024-08-06 DIAGNOSIS — Z7982 Long term (current) use of aspirin: Secondary | ICD-10-CM | POA: Insufficient documentation

## 2024-08-06 DIAGNOSIS — N1832 Chronic kidney disease, stage 3b: Secondary | ICD-10-CM | POA: Insufficient documentation

## 2024-08-06 DIAGNOSIS — I110 Hypertensive heart disease with heart failure: Secondary | ICD-10-CM | POA: Insufficient documentation

## 2024-08-06 DIAGNOSIS — I2510 Atherosclerotic heart disease of native coronary artery without angina pectoris: Secondary | ICD-10-CM | POA: Insufficient documentation

## 2024-08-06 DIAGNOSIS — Z7984 Long term (current) use of oral hypoglycemic drugs: Secondary | ICD-10-CM | POA: Insufficient documentation

## 2024-08-06 DIAGNOSIS — I5020 Unspecified systolic (congestive) heart failure: Secondary | ICD-10-CM

## 2024-08-06 DIAGNOSIS — Z8673 Personal history of transient ischemic attack (TIA), and cerebral infarction without residual deficits: Secondary | ICD-10-CM | POA: Insufficient documentation

## 2024-08-06 DIAGNOSIS — M109 Gout, unspecified: Secondary | ICD-10-CM | POA: Insufficient documentation

## 2024-08-06 DIAGNOSIS — Z5971 Insufficient health insurance coverage: Secondary | ICD-10-CM | POA: Insufficient documentation

## 2024-08-06 DIAGNOSIS — Z139 Encounter for screening, unspecified: Secondary | ICD-10-CM

## 2024-08-06 LAB — BASIC METABOLIC PANEL WITH GFR
Anion gap: 11 (ref 5–15)
BUN: 23 mg/dL — ABNORMAL HIGH (ref 6–20)
CO2: 26 mmol/L (ref 22–32)
Calcium: 9.7 mg/dL (ref 8.9–10.3)
Chloride: 103 mmol/L (ref 98–111)
Creatinine, Ser: 1.42 mg/dL — ABNORMAL HIGH (ref 0.61–1.24)
GFR, Estimated: 59 mL/min — ABNORMAL LOW
Glucose, Bld: 125 mg/dL — ABNORMAL HIGH (ref 70–99)
Potassium: 3.7 mmol/L (ref 3.5–5.1)
Sodium: 140 mmol/L (ref 135–145)

## 2024-08-06 LAB — PRO BRAIN NATRIURETIC PEPTIDE: Pro Brain Natriuretic Peptide: 1333 pg/mL — ABNORMAL HIGH

## 2024-08-06 NOTE — Patient Instructions (Signed)
 START Jardiance  10 mg daily.  Labs done today, your results will be available in MyChart, we will contact you for abnormal readings.   Cardiac Sarcoidosis/Inflammation PET Scan Patient Instructions  Please report to Radiology at the [facility] Main Entrance 15 minutes early for your test.  [address] [place], [place] [address]  BRING FOOD DIARY WITH YOU TO THIS APPOINTMENT For 24 hours before the test: Do not exercise! Do not eat after 5 pm the day before your test! To make sure that your scanning results are accurate, you MUST follow the sarcoid prep meal diet starting the day before your PET scan. This diet involves eating no carbohydrates 24 hours before the test.  You will keep a log of all that you eat the day before your test. If you have questions or do not understand this diet, please call [phone] for more information. If you are unable to follow this diet, please discuss an alternative strategy with the coordinator.  If you are diabetic, continue your diabetes medications as usual on the day before until you begin to fast. NO DIABETES MEDICATIONS ONCE YOU BEGIN TO FAST. On the morning of your test, please wait until after your test to take your morning medications.  What foods can I eat the day before my test?  Drink only water or black coffee (WITHOUT sugar, artificial sweetener, cream, or milk). Eggs (prepared without milk or cheese)  Meat that is either broiled or pan fried in butter WITHOUT breading (chicken, turkey, bacon, meat-only sausage, hamburger, steak, fish) Butter, salt & pepper What foods must I AVOID the day before my test?  Do not consume alcoholic beverages, sodas, fruit juice, coffee creamer, or sports drinks  Do not eat vegetables, beans, nuts, fruits, juices, bread, grains, rice, pasta, potatoes, or any baked goods Do not eat dairy products (milk, cheese, etc.)  Do not eat mayonnaise, ketchup, tartar sauce, mustard, or other  condiments Do not add sugar, artificial sweeteners, or Splenda (sucralose) to foods or drinks  Do not eat breaded foods (like fried chicken)  Do not eat sweets, candy, gum, sweetened cough drops, lozenges, or sugar  Do not eat sweetened, grilled, or cured meats or meat with carbohydrate-containing additives (some sausages, ham, sweetened bacon)  Suggested items for breakfast, lunch, or dinner:  Breakfast  3 to 5 fatty sausage links fried in butter. 3 to 5 bacon strips.  3 eggs pan fried in butter (no milk or cheese).  Lunch/Dinner  2 hamburger patties fried in butter. Chicken or fatty fish pan fried in butter. No breading. 8 oz. fatty steak pan fried in butter.  Beverages  Drink only water or black coffee. DO NOT ADD SUGAR, ARTIFICIAL SWEETENER, CREAM, OR MILK  Please call [facility] to cancel or reschedule your appointment at [phone] For more information and frequently asked questions, please visit our website : [email or web address]   Cardiac Sarcoidosis/Inflammation PET Scan  Food Diary Name: _____________________________ Please fill in EXACTLY what you have eaten and when for 24 hours PRIOR to your test date.  Time Food/Drink Comments  Breakfast                Lunch                Dinner                Snacks                 DO NOT EXERCISE  THE DAY BEFORE YOUR TEST DO NOT EAT AFTER 5 PM THE DAY BEFORE YOUR TEST.  ON THE DAY OF YOUR TEST, DO NOT EAT ANY FOOD AND ONLY DRINK CLEAR WATER! PLEASE BRING THIS FOOD DIARY WITH YOU TO YOUR APPOINTMENT  Your provider has ordered a high resolution Chest CT. You will be called to have this test arranged.  Keep follow up as scheduled.  If you have any questions or concerns before your next appointment please send us  a message through [place] or call our office at [phone].    TO LEAVE A MESSAGE FOR THE NURSE SELECT OPTION 2, PLEASE LEAVE A MESSAGE INCLUDING: YOUR NAME DATE OF  BIRTH CALL BACK NUMBER REASON FOR CALL**this is important as we prioritize the call backs  YOU WILL RECEIVE A CALL BACK THE SAME DAY AS LONG AS YOU CALL BEFORE 4:00 PM  At the [facility], you and your health needs are our priority. As part of our continuing mission to provide you with exceptional heart care, we have created designated Provider Care Teams. These Care Teams include your primary Cardiologist (physician) and Advanced Practice Providers (APPs- Physician Assistants and Nurse Practitioners) who all work together to provide you with the care you need, when you need it.   You may see any of the following providers on your designated Care Team at your next follow up: Dr Toribio Fuel Dr Ezra Shuck Dr. Morene Brownie Greig Mosses, NP Caffie Shed, [place] [facility] [place], [place] Beckey Coe, NP Jordan Lee, NP Ellouise Class, NP Tinnie Redman, PharmD Jaun Bash, PharmD   Please be sure to bring in all your medications bottles to every appointment.    Thank you for choosing [facility]

## 2024-08-06 NOTE — Progress Notes (Signed)
"   ReDS Vest / Clip - 08/06/24 1516       ReDS Vest / Clip   Station Marker B    Ruler Value 34    ReDS Value Range High volume overload    ReDS Actual Value 42          "

## 2024-08-06 NOTE — Progress Notes (Signed)
 Medication Samples have been provided to the patient.  Drug name: Jardiance         Strength: 10 mg        Qty: 2 botles  LOT: [id number]  Exp.Date: 10-12-25  Dosing instructions: take 1 tablet daily   The patient has been instructed regarding the correct time, dose, and frequency of taking this medication, including desired effects and most common side effects.   Adaliz Dobis M Donn Zanetti 3:45 PM 08/06/2024

## 2024-08-07 LAB — ANGIOTENSIN CONVERTING ENZYME: Angiotensin-Converting Enzyme: 28 U/L (ref 14–82)

## 2024-08-10 ENCOUNTER — Ambulatory Visit (HOSPITAL_COMMUNITY): Payer: Self-pay | Admitting: Family Medicine

## 2024-08-10 NOTE — Progress Notes (Signed)
 Saw AHF clinic 1/23 and is scheduled back 2/19 with testing scheduled 08/26/24.

## 2024-08-12 ENCOUNTER — Telehealth (HOSPITAL_COMMUNITY): Payer: Self-pay | Admitting: Licensed Clinical Social Worker

## 2024-08-12 NOTE — Telephone Encounter (Signed)
 CSW attempted to call patient to discuss medical coverage concerns- unable to reach left VM requesting return call  Troy HILARIO Leech, LCSW Clinical Social Worker [facility] Desk#: [phone] Cell#: [phone]

## 2024-08-17 ENCOUNTER — Other Ambulatory Visit (HOSPITAL_COMMUNITY): Payer: Self-pay

## 2024-08-20 ENCOUNTER — Telehealth (HOSPITAL_COMMUNITY): Payer: Self-pay | Admitting: Licensed Clinical Social Worker

## 2024-08-20 NOTE — Telephone Encounter (Signed)
 CSW attempted to call pt regarding lack of insurance- unable to reach- left VM requesting return call  Andriette HILARIO Leech, LCSW Clinical Social Worker [facility] Desk#: [phone] Cell#: [phone]

## 2024-08-26 ENCOUNTER — Ambulatory Visit (HOSPITAL_COMMUNITY): Payer: Self-pay

## 2024-08-26 ENCOUNTER — Encounter (HOSPITAL_COMMUNITY): Payer: Self-pay

## 2024-09-02 ENCOUNTER — Ambulatory Visit (HOSPITAL_COMMUNITY): Payer: Self-pay | Admitting: Cardiology
